# Patient Record
Sex: Male | Born: 1945
Health system: Southern US, Community
[De-identification: ages and names within clinical notes are randomized; demographics above are authoritative.]

## PROBLEM LIST (undated history)

## (undated) DIAGNOSIS — I10 Essential (primary) hypertension: Secondary | ICD-10-CM

## (undated) DIAGNOSIS — R569 Unspecified convulsions: Secondary | ICD-10-CM

## (undated) DIAGNOSIS — K219 Gastro-esophageal reflux disease without esophagitis: Secondary | ICD-10-CM

## (undated) DIAGNOSIS — E119 Type 2 diabetes mellitus without complications: Secondary | ICD-10-CM

## (undated) DIAGNOSIS — N400 Enlarged prostate without lower urinary tract symptoms: Secondary | ICD-10-CM

## (undated) DIAGNOSIS — U071 COVID-19: Secondary | ICD-10-CM

## (undated) DIAGNOSIS — E7800 Pure hypercholesterolemia, unspecified: Secondary | ICD-10-CM

## (undated) DIAGNOSIS — J449 Chronic obstructive pulmonary disease, unspecified: Secondary | ICD-10-CM

## (undated) HISTORY — DX: Type 2 diabetes mellitus without complications: E11.9

## (undated) HISTORY — DX: Gastro-esophageal reflux disease without esophagitis: K21.9

## (undated) HISTORY — DX: Benign prostatic hyperplasia without lower urinary tract symptoms: N40.0

---
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
## 1996-12-17 HISTORY — PX: KNEE ARTHROSCOPY: SHX127

## 2002-03-22 ENCOUNTER — Encounter: Payer: Self-pay | Admitting: Emergency Medicine

## 2002-03-22 ENCOUNTER — Emergency Department (HOSPITAL_COMMUNITY): Admission: EM | Admit: 2002-03-22 | Discharge: 2002-03-22 | Payer: Self-pay | Admitting: Emergency Medicine

## 2002-03-31 ENCOUNTER — Encounter: Payer: Self-pay | Admitting: Internal Medicine

## 2002-03-31 ENCOUNTER — Ambulatory Visit (HOSPITAL_COMMUNITY): Admission: RE | Admit: 2002-03-31 | Discharge: 2002-03-31 | Payer: Self-pay | Admitting: Internal Medicine

## 2002-05-08 ENCOUNTER — Ambulatory Visit (HOSPITAL_COMMUNITY): Admission: RE | Admit: 2002-05-08 | Discharge: 2002-05-08 | Payer: Self-pay | Admitting: *Deleted

## 2002-05-08 ENCOUNTER — Encounter: Payer: Self-pay | Admitting: *Deleted

## 2003-06-03 ENCOUNTER — Encounter: Payer: Self-pay | Admitting: Internal Medicine

## 2003-06-03 ENCOUNTER — Ambulatory Visit (HOSPITAL_COMMUNITY): Admission: RE | Admit: 2003-06-03 | Discharge: 2003-06-03 | Payer: Self-pay | Admitting: Internal Medicine

## 2003-10-31 ENCOUNTER — Inpatient Hospital Stay (HOSPITAL_COMMUNITY): Admission: EM | Admit: 2003-10-31 | Discharge: 2003-11-08 | Payer: Self-pay | Admitting: Emergency Medicine

## 2003-11-02 ENCOUNTER — Encounter (INDEPENDENT_AMBULATORY_CARE_PROVIDER_SITE_OTHER): Payer: Self-pay | Admitting: *Deleted

## 2006-07-08 ENCOUNTER — Ambulatory Visit (HOSPITAL_COMMUNITY): Admission: RE | Admit: 2006-07-08 | Discharge: 2006-07-08 | Payer: Self-pay | Admitting: Internal Medicine

## 2010-07-05 ENCOUNTER — Emergency Department (HOSPITAL_COMMUNITY): Admission: EM | Admit: 2010-07-05 | Discharge: 2010-07-06 | Payer: Self-pay | Admitting: Emergency Medicine

## 2011-03-03 LAB — URINALYSIS, ROUTINE W REFLEX MICROSCOPIC
Bilirubin Urine: NEGATIVE
Glucose, UA: 250 mg/dL — AB
Hgb urine dipstick: NEGATIVE
Ketones, ur: NEGATIVE mg/dL
Nitrite: NEGATIVE
Protein, ur: NEGATIVE mg/dL
Specific Gravity, Urine: 1.017 (ref 1.005–1.030)
Urobilinogen, UA: 0.2 mg/dL (ref 0.0–1.0)
pH: 5.5 (ref 5.0–8.0)

## 2011-03-03 LAB — DIFFERENTIAL
Basophils Absolute: 0.2 10*3/uL — ABNORMAL HIGH (ref 0.0–0.1)
Basophils Relative: 1 % (ref 0–1)
Eosinophils Absolute: 0.5 10*3/uL (ref 0.0–0.7)
Eosinophils Relative: 3 % (ref 0–5)
Lymphocytes Relative: 43 % (ref 12–46)
Lymphs Abs: 6.5 10*3/uL — ABNORMAL HIGH (ref 0.7–4.0)
Monocytes Absolute: 1.4 10*3/uL — ABNORMAL HIGH (ref 0.1–1.0)
Monocytes Relative: 9 % (ref 3–12)
Neutro Abs: 6.4 10*3/uL (ref 1.7–7.7)
Neutrophils Relative %: 44 % (ref 43–77)

## 2011-03-03 LAB — COMPREHENSIVE METABOLIC PANEL
ALT: <8 U/L (ref 0–53)
AST: 42 U/L — ABNORMAL HIGH (ref 0–37)
Albumin: 4.5 g/dL (ref 3.5–5.2)
Alkaline Phosphatase: 53 U/L (ref 39–117)
BUN: 12 mg/dL (ref 6–23)
CO2: 10 mEq/L — ABNORMAL LOW (ref 19–32)
Calcium: 9.7 mg/dL (ref 8.4–10.5)
Chloride: 106 mEq/L (ref 96–112)
Creatinine, Ser: 1.39 mg/dL (ref 0.4–1.5)
GFR calc Af Amer: >60 mL/min (ref >60)
GFR calc non Af Amer: 52 mL/min — ABNORMAL LOW (ref >60)
Glucose, Bld: 219 mg/dL — ABNORMAL HIGH (ref 70–99)
Potassium: 4.5 mEq/L (ref 3.5–5.1)
Sodium: 141 mEq/L (ref 135–145)
Total Bilirubin: 0.4 mg/dL (ref 0.3–1.2)
Total Protein: 7.7 g/dL (ref 6.0–8.3)

## 2011-03-03 LAB — PROTIME-INR
INR: 1.22 (ref 0.00–1.49)
Prothrombin Time: 15.3 seconds — ABNORMAL HIGH (ref 11.6–15.2)

## 2011-03-03 LAB — CBC
HCT: 52.1 % — ABNORMAL HIGH (ref 39.0–52.0)
Hemoglobin: 17.5 g/dL — ABNORMAL HIGH (ref 13.0–17.0)
MCH: 32.1 pg (ref 26.0–34.0)
MCHC: 33.6 g/dL (ref 30.0–36.0)
MCV: 95.6 fL (ref 78.0–100.0)
Platelets: 263 10*3/uL (ref 150–400)
RBC: 5.45 MIL/uL (ref 4.22–5.81)
RDW: 13.2 % (ref 11.5–15.5)
WBC: 15 10*3/uL — ABNORMAL HIGH (ref 4.0–10.5)

## 2011-03-03 LAB — POCT CARDIAC MARKERS
CKMB, poc: <1 ng/mL — ABNORMAL LOW (ref 1.0–8.0)
Myoglobin, poc: 133 ng/mL (ref 12–200)
Troponin i, poc: <0.05 ng/mL (ref 0.00–0.09)

## 2011-03-03 LAB — BLOOD GAS, ARTERIAL
Bicarbonate: 19.1 mEq/L — ABNORMAL LOW (ref 20.0–24.0)
FIO2: 0.21 %
O2 Saturation: 91.8 %
Patient temperature: 98.6
pH, Arterial: 7.346 — ABNORMAL LOW (ref 7.350–7.450)

## 2011-03-03 LAB — APTT: aPTT: 28 seconds (ref 24–37)

## 2011-03-03 LAB — ETHANOL: Alcohol, Ethyl (B): <5 mg/dL (ref 0–10)

## 2011-03-03 LAB — VALPROIC ACID LEVEL: Valproic Acid Lvl: 25.8 ug/mL — ABNORMAL LOW (ref 50.0–100.0)

## 2011-03-03 LAB — PATHOLOGIST SMEAR REVIEW

## 2011-05-04 NOTE — H&P (Signed)
Trevor [name], Trevor [name]                           ACCOUNT NO.:  [id number]   MEDICAL RECORD NO.:  [id number]                   PATIENT TYPE:  INP   LOCATION:  1826                                 FACILITY:  [facility]   PHYSICIAN:  Elliot Cousin, M.D.                 DATE OF BIRTH:  Feb 18, 1946   DATE OF ADMISSION:  10/30/2003  DATE OF DISCHARGE:                                HISTORY & PHYSICAL   PRIMARY CARE PHYSICIAN:  Lucky Cowboy, M.D.   CHIEF COMPLAINT:  Tonic clonic seizure and postictal syncope, postictal  nausea and vomiting, cough.   HISTORY OF PRESENT ILLNESS:  Trevor [name] is a 65 year old man with a past  medical history significant for hypertension, Chronic pulmonary obstructive  disease and depression who presented to the emergency department on the  night of October 30, 2003 with an episode of a witnessed tonic clonic  seizure by his wife.  This occurred at approximately 8 p.m. last night.  The  patient was getting ready for bed.  His wife noticed that he had a funny  look on his face.  He seemed spaced out.  He began to stare out in space  for a few seconds, unresponsive to his name.  He suddenly began shaking all  over and fell back onto the bed.  The patient was apparently unconscious for  approximately three to five minutes per the wife's account.  The patient  finally regained consciousness.  He was alert and oriented to his  surroundings when he came to.  He did not have any incontinence of his  bladder or bowels.  There was no trauma.  The patient fell back onto his  bed.  The patient's wife proceeded to call EMS.  When EMS arrived the  patient was alert and oriented to all questions.  His skin was pale,  however, he was not diaphoretic or short of breath.  His blood pressure was  143/72, his pulse was 56, and his respiratory rate was 18.  His capillary  blood sugar was 301.  The patient was transferred to the emergency  department.  When the patient arrived to the  emergency department he was  alert and oriented.  He had no complaints.  CT scan of the head was ordered  and was negative.  His blood work was rather unremarkable with exception of  a blood glucose of 205.  The patient has no known previous history of  diabetes.  The patient was in the process of going home, discharged by the  emergency department physician.  The patient was on his way to the bathroom  when suddenly he began rolling his eyes back in his head and began having a  tonic clonic jerking of his extremities.  He was about to fall on the floor  but was caught by the nurse and his wife.  The nurse witnessed the tonic  clonic seizure which lasted approximately 20 to 30 seconds.  The patient was  postictal for about five minutes then regained consciousness.  The patient  will therefore be admitted for evaluation and management of apparently new  onset seizures and newly diagnosed diabetes mellitus.   PAST MEDICAL HISTORY:  1. History of syncope back in April of 2003.     a. Outpatient evaluation revealed a negative VQ scan in May of 2003, a        negative stress-test per [facility], and per patient's        history, a CT scan of the head which was negative in April of 2003.  2. Depression with a recent change in psychotropic medications.     a. Zoloft discontinued on October 26, 2003 and Seroquel and Effexor added        on October 26, 2003.  3. Anxiety.  4. Recent insomnia.  5. Fluid on the ears per doctor's visit on October 26, 2003.  The patient     also had an episode of tinnitus on October 26, 2003.  6. Hypertension.  7. Hyperlipidemia.  8. Chronic pulmonary obstructive disease with ongoing tobacco abuse.  9. A colon polyp status post polypectomy per colonoscopy in 2003 per Dr.     Arlyce Dice (old records not available).    MEDICATIONS:  1. Seroquel, question dose, one daily (started on October 26, 2003).  2. Remeron 30 mg 1/2 tablet q.h.s.  3. Effexor, question  dose, daily (started October 26, 2003).  4. Atenolol 25 mg daily.  5. Lipitor 20 mg q.h.s.  6. Zoloft 25 mg daily (stopped on October 26, 2003).   ALLERGIES:  PENICILLIN.   SOCIAL HISTORY:  The patient is married and lives in [place] with his  wife.  He has two children.  He works at [employer].  He denies alcohol  use.  He denies drug use.  He smokes approximately a pack of cigarettes per  week and has been doing so for 40 years.  He has had a recent change in his  job function which has been of some distress to him.   FAMILY HISTORY:  His father died of renal failure at 27 years of age, he  also had a history of diabetes mellitus.  His mother died of a stroke at 11  years of age.   REVIEW OF SYSTEMS:  The patient's review of systems is negative for fever,  chills, headache, shortness of breath, dysuria, joint pain and rash.  The  patient has a negative history of rhinorrhea, negative history of sore  throat.   His review of systems is positive for chronic blurred vision (no double  vision). He has also had some intermittent dizziness over the past couple of  days.  His review of systems is also positive for insomnia and a ringing and  popping in both ears.  His review of systems is also positive for an  increase in depression and anxiety but no suicidal ideations.  He  occasionally has shortness of breath but no orthopnea.  He has had a  productive cough with yellow and brown sputum over the past two or three  days.  He occasionally has chest pain (once or twice) particularly over the  past two or three days but generally has chest pain rarely.  He has also had  a recent increase in insomnia.  He has had mild epigastric discomfort which  he does not describe as pain.  He has loose stools about five days ago.  The patient has had recent complaints of polyuria.  PHYSICAL EXAMINATION:  VITAL SIGNS:  Temperature 98.9, pulse 88, blood  pressure 124/68, respiratory rate 20,  oxygen saturation 99% on two liters.  GENERAL:  The patient is now an alert 65 year old, Caucasian man of average  build who is currently lying in bed alert and in no acute distress.  HEENT:  Head is normocephalic, atraumatic.  Pupils are equal, round and  reactive to light.  Extraocular movements are intact.  Conjunctivae are  clear, sclerae are white.  He does appear to have a mild strabismus.  Tympanic membranes are mildly obscured by hair and cerumen.  No fluid is  seen bilaterally.  Nasal mucosa is moist, no drainage, no sinus tenderness.  Oropharynx reveals mildly dry mucous membranes.  He does have whitish  discoloration of his tongue.  There is no posterior exudates, no erythema.  NECK:  Supple, no adenopathy, no thyromegaly, no bruit.  LUNGS:  Clear to auscultation bilaterally.  His breathing is unlabored.  He  did have several episodes of coughing which was productive of yellow sputum  as well as brownish sputum.  HEART:  S1, S2 with no murmurs, rubs, or gallops.  ABDOMEN:  Hypoactive bowel sounds.  Abdomen is soft, nontender,  nondistended, no hepatosplenomegaly, no masses palpated.  RECTAL/GU:  Deferred.  EXTREMITIES:  The patient has 1+ pedal pulses bilaterally.  No pretibial  edema. He moves his arms and legs with good range of motion.  No acute joint  abnormalities.  NEUROLOGIC:  The patient is alert and oriented x3, however, he did miss the  date but did get the day of the week, the year, the month, the city, the  state, his birth date, the president, his wife's full name and birth date.  Cranial nerves II-XII are intact with exception of a mild strabismus.  Cerebellar was intact with finger-to-nose and heel-to-shin bilaterally.  This testing was done in the sitting position.  Strength is 5/5 bilaterally  and symmetrically with the upper and the lower extremities.  Sensation is  intact.  Gait was not assessed.  Plantar reflexes were down going on the  right, questionable  up going on the left.  The patient had no facial droop,  no dysarthria.  His speech was appropriate and he followed directions well.   ADMISSION LABORATORIES:  CT scan of the head was negative for any acute  abnormalities.  WBC 10.1 thousand, hemoglobin 16.6, hematocrit 48.2, MCV  91.1, platelets 255,000.  Sodium 135, potassium 4.7, chloride 102, CO2 24,  glucose 205, BUN 18, creatinine 1.3, calcium 9.6, total protein 7.6, albumin  4.4, AST 28, ALT 25, alkaline phosphatase 77, total bilirubin 1.2.   ASSESSMENT:  1. Tonic clonic seizure, new onset.  The patient had two witnessed tonic     clonic seizures, one at home and one in the emergency department as     witnessed by the R. N.  The patient's syncope work up was negative in the     emergency department and the patient was actually getting prepared to go     home.  He subsequently had the second seizure.  The patient had no head    trauma.  He was postictal for about five minutes but on current exam he     was alert and oriented.  The differential diagnosis includes seizure from     new medications including Effexor which has a positive  association with     seizures, Seroquel which has a rare association with seizures.  Will also     consider and abrupt withdrawal of the Zoloft as a potential cause.  Will     also consider electrolyte abnormalities and endocrine abnormalities  such     as low magnesium, low phosphorus, elevated blood glucose, and thyroid     disease, respectively.  In addition will need to rule out cerebrovascular     disease as well as cerebral tumor.  It is unlikely that the patient has     meningitis or encephalitis given that he is afebrile and does not have an     elevated white blood cell count.  2. Postictal syncope.  The patient has now completely recovered from the     seizure, he is totally alert and conscious now.  The patient had an     episode of syncope back in April of 2003 which yielded a negative work  up     per patient's history.  3. Depression with anxiety.  The patient has had increase in anxiety and     depression symptoms after his job duties were changed recently.  This     apparently prompted Dr. Oneta Rack to change his antidepressant medications.  4. Postictal nausea and vomiting.  The patient apparently had no nausea and     vomiting at home.  Following the seizure in the emergency department he     had several episodes of nausea and vomiting.  The patient did say he had     some abdominal discomfort at home but he did not described it as pain.     He denies abdominal pain at this time.  5. Productive cough.  The patient has had a productive cough over the past     few days.  He has a history of chronic pulmonary obstructive disease by     radiographic changes.  He also smokes a pack of cigarettes per week but     he has smoked quite a bit more in the past.  6. Hyperglycemia.  The patient's blood sugar was 301 on evaluation by EMS,     and was 205 in the emergency department.  The patient probably has newly     diagnosed type 2 diabetes mellitus.   PLAN:  1. The patient was given a loading dose of Dilantin one gram IV x1.  Will     request a neurology consult and will defer further anti-seizure     medication treatment to the neurologist for now.  2. Will add p.r.n. Ativan 1-2 mg p.r.n. for seizure.  3. The patient will be admitted to a telemetry bed and placed on seizure     precautions.  Neuro checks will be obtained every three hours x12 hours     and then every four hours times an additional 24 hours.  4. Will check a urine drug screen, TSH, free T4, RPR, sed rate, vitamin B12,     and folate levels.  5. Will check a magnesium and phosphorus level.  Will also check an amylase     and lipase level.  Will check a chest x-ray and an acute abdominal     series.  6. Will also check an ultrasound of the abdomen to evaluate the nausea and     vomiting. 7. Will order an MRI of  the brain to evaluate the seizure.  Will also order     EEG  which will probably be read by the neurology team.  8. The patient was given one liter of normal saline in the emergency     department.  Will continue volume repletion with normal saline at 100     cc/Hr.  9. Will add a sliding scale Insulin regimen q.a.c. and q.h.s.  10.      Will add Protonix 40 mg IV daily.  11.      Will add Phenergan and Zofran p.r.n. for nausea.  Will hold on     Reglan for now because it does lower the seizure threshold.  12.      Atrovent nebulization q.6h.  Will add Robitussin p.r.n. for cough.  13.      Keep patient on sips and chips for now and rare p.o. medications.     If the patient's nausea and vomiting subsides and if his ultrasound is     negative will re-start his diet but will place the patient on a     carbohydrate modified diet.  14.      Will hold his psychotropic medications for now as well as his     Atenolol and Lipitor.  Will start when the patient is free from nausea     and vomiting.                                                Elliot Cousin, M.D.    DF/MEDQ  D:  10/31/2003  T:  10/31/2003  Job:  [id number]   cc:   Lucky Cowboy, M.D.  [address]  [place], [place] [address]  Fax: [phone]

## 2011-05-04 NOTE — Discharge Summary (Signed)
NAMEJARVIS, Trevor [name]                           ACCOUNT NO.:  [id number]   MEDICAL RECORD NO.:  [id number]                   PATIENT TYPE:  INP   LOCATION:  3015                                 FACILITY:  [facility]   PHYSICIAN:  Elliot Cousin, M.D.                 DATE OF BIRTH:  07/10/1946   DATE OF ADMISSION:  10/30/2003  DATE OF DISCHARGE:  11/08/2003                                 DISCHARGE SUMMARY   DISCHARGE DIAGNOSES:  1. Complex partial seizure with generalization.  2. Acute renal insufficiency, nonoliguric.  3. Rash thought to be secondary to Dilantin.  4. Hyperglycemia.  5. Posttraumatic stress disorder.   SECONDARY DISCHARGE DIAGNOSES:  1. History of syncope in April, 2003.     a. Outpatient evaluation revealed a negative VQ scan in May of 2003 and a        negative stress test per [facility] and per patient's history        a negative CT scan of the head in April of 2003.  2. Depression with a recent change in psychotropic medications.     a. Zoloft discontinued on October 26, 2003 and Seroquel and Effexor added        on October 26, 2003.  3. Hypertension.  4. Hyperlipidemia.  5. Chronic obstructive pulmonary disease with ongoing tobacco abuse.  6. Colon polyp status post polypectomy per colonoscopy in 2003 per Dr.     Arlyce Dice (old records not available).   DISCHARGE MEDICATIONS:  1. Depakote 500 mg one p.o. t.i.d.  2. Lipitor 10 mg q.h.s.  3. Atenolol 25 mg daily.  4. Discontinue Seroquel and Remeron for now.  5. Also discontinue Effexor and followup with [facility] for new     psychotropic medications.   DISCHARGE DISPOSITION:  The patient was discharged to home on November 08, 2003, in improved and stable condition.  He was advised to followup with his  primary care physician, Dr. Oneta Rack, in one week.  He was also advised to  followup at [facility] in two days at the walk-in clinic.   CONSULTATIONS:  1. Delia Heady, M.D.  2. Dr.  Darrick Penna.  3. Dr. Jeanie Sewer.   PROCEDURES PERFORMED:  1. CT scan of the head on December 30, 2002 negative.  2. MRI of the brain on October 31, 2003.  No lesion is seen that would     precipitate seizures.  Solitary punctate focus of abnormal signal in the     right frontal white matter which was isolated.  3. EEG on November 01, 2003.  The results revealed abnormal EEG on the basis     of diffuse background slowing.  No definite seizure activity in this     record as read by Dr. Sharene Skeans.  4. MRI of the abdomen.  The results revealed single renal arteries     bilaterally.  Mild narrowing at the  origin of the right renal artery,     doubt this is hemodynamically significant.  5. Ultrasound of the abdomen on October 31, 2003.  The results were     negative.   HISTORY OF PRESENT ILLNESS:  [name] is a 65 year old man with a past  medical history significant for hypertension, COPD, and depression who  presented to the emergency department on the night of October 30, 2003,  with an episode of a witnessed tonic clonic seizure.  The patient was not  incontinent of his bladder or bowels per his wife.  There was no trauma.  The patient fell back onto the bed.  His wife called EMS.  When EMS arrived,  he was alert and oriented to all questions.  When he presented to the  emergency department, his evaluation was negative.  However, as the patient  was being discharged from the emergency department, he experienced another  tonic clonic seizure.  The patient was therefore admitted for evaluation and  management of seizure disorder.   HOSPITAL COURSE:  COMPLEX PARTIAL SEIZURE WITH GENERALIZATION.  The initial  management started in the emergency department when the patient was loaded  with 500 mg of Dilantin IV.  He was transferred to a telemetry bed and  monitored closely.  Seizure precautions and neurological checks were  instituted during the hospitalization.  Laboratory studies were  ordered to  rule out an endocrine or metabolic cause of the patient's apparent new onset  seizures.  An urine drug screen was also ordered.  An MRI of the head and an  EEG were also ordered for evaluation.  The patient's initial blood work was  significant for a glucose of 205, a magnesium of 3.5, phosphorus of 2.1, and  the remainder of the chemistry panel was within normal limits.  The  patient's CBC was within normal limits as well.  Further lab results  included a normal TSH at 1.699, a normal free T4 at 1.26, a normal B12 level  at 511, and a normal folate level of 11.9.  RPR was also negative.  The  urine drug screen was positive only for benzodiazepines which was  attributable to his psychotropic medications.  The MRI of the brain showed  no acute strokes or intracranial masses.  The MRI did show a solitary  punctate focus of abnormal signal in the right frontal white matter.  This  finding was nonspecific.  The EEG revealed abnormal diffuse background  slowing.  There was no definite seizure activity on the record.   Dr. Pearlean Brownie provided the neurological consultation.  Per his assessment, the  patient most likely experienced complex partial seizures with secondary  generalization.  The precipitating factors per his history included a lack  of sleep for the past two days.  Dr. Pearlean Brownie also felt that the patient most  likely had epilepsy, given that he had an unwitnessed synopal episode one  year ago and possible past episodes of staring spells.  He recommended long-  term anticonvulsant therapy.  He agreed with the Dilantin and recommended  Dilantin dosing during the hospitalization.  There was also a concern about  Seroquel and Effexor to medications that were started on October 26, 2003.  Could these medications lower the seizure threshold.  Dr. Jeanie Sewer was  consulted for his evaluation and recommendations.  Dr. Jeanie Sewer thought that the Seroquel and Effexor could lower the seizure  threshold because of  the increased release of norepinephrine.  Dr. Jeanie Sewer recommended changing  the antidepressant medications during the hospital course, however the  patient was reluctant to.  Dr. Jeanie Sewer recommended Celexa.  However, the  patient will make the decision following outpatient evaluation.  The patient  had no further seizures during the hospitalization.  Both the Remeron and  the Effexor as well as the Seroquel were held during the hospital course.  The Dilantin eventually had to be discontinued because the patient developed  a diffuse rash.  He was, therefore, started on treatment with Depakote 500  mg b.i.d. which was titrated up to 500 mg t.i.d. prior to hospital  discharge.  He had no further seizures during the hospitalization.  He was  advised to followup with Dr. Pearlean Brownie, if needed, in 2-3 weeks.   1. ACUTE NONOLIGURIC RENAL INSUFFICIENCY.  The patient's initial BUN and     creatinine were 18 and 1.3 respectively.  However, on hospital day #3 the     creatinine increased to 5.8 and the BUN increased to 57.  The creatinine     increased further to 6.1 and the BUN increased further to 58.  The     patient had good urine output during the entire hospital course.  The     etiology of the acute renal insufficiency was unclear, therefore, a Renal     consult was requested.  Dr. Darrick Penna provided the initial Nephrology     consultation.  He recommended obtaining an MRA/MRI of the abdomen,     specifically looking at the renal arteries, as well as an ultrasound of     the abdomen, specifically looking at the kidneys.  He also ordered     several other tests including urine eosinophils, a compliment panel and     an ANA.  The MRA of the abdomen revealed no significant stenoses,     although there was some mild narrowing at the origin of the right renal     artery.  Dr. Darrick Penna felt that this was not a cause of the patient's     renal insufficiency.  The ultrasound of  the abdomen was within normal     limits.  The urine for eosinophils were negative.  The ANA was negative     as well.  The neutrophil cytoplasm antibody IgG was negative.  The serum     IgG was mildly low at 591 and the serum IgM was within normal limits at     134.  The creatine kinase was within normal limits at 146.  The patient's     renal function slowly improved with gentle volume repletion and a volume     challenge.  Over the last two days of hospitalization, the BUN slowly     decreased to a nadir of 18 prior to hospital discharge.  The patient's     creatinine also decreased to a nadir of 1.9 prior to hospital discharge.     The etiology of the acute renal insufficiency is unknown, however     Dilantin may have caused the insult.  As stated above, the Dilantin was     discontinued.  The patient's azotemia improved thereafter.   1. DIFFUSE MACULOPAPULAR RASH.  Following the loading dose of Dilantin, the    patient began to experience a pruritic rash approximately 48 hours later.     He was treated with as-needed Benadryl.  Dr. Pearlean Brownie was asked to evaluate     the patient's seizure medication for a possible change.  Dr. Pearlean Brownie  thought that the rash was probably precipitated by the Dilantin,     therefore he discontinued the Dilantin and started the patient on     treatment with Depakote 500 mg b.i.d. titrate to t.i.d.  Shortly     thereafter the patient's rash started resolving.   1. HYPERLIPIDEMIA.  The patient's blood glucose on admission was 205.  The     patient had no prior history of diabetes mellitus.  His capillary blood     sugars were followed during the hospital course and ranged between 90 and     140.  A hemoglobin A1c was ordered for evaluation.  The A1c was 6.3.  The     patient was placed on a carbohydrate modified diet and treated with a     sliding scale insulin regimen during the hospital course.  His blood     sugars returned to normal prior to hospital  discharge.  He apparently     needs no further management at this time, however he may be at risk for     type 2 diabetes mellitus.   1. POSTTRAUMATIC STRESS DISORDER WITH A HISTORY OF DEPRESSION.  The patient     had been treated in the past with Zoloft by his primary care physician,     Dr. Oneta Rack.  He was also treated with Remeron 30 mg 1/2 tablet q.h.s.     However, the Zoloft was discontinued on October 25, 2004, and Seroquel     and Effexor was started on October 26, 2003.  These two medications, the     Seroquel and the Effexor, lower the seizure threshold, therefore they     were discontinued during the hospital course.  Dr. Jeanie Sewer,     psychiatrist, was consulted.  He recommended started Celexa, however the     patient was hesitant.  Dr. Jeanie Sewer strongly recommended that the     patient followup in the [facility] shortly following     discharge.  Arrangements were made by Dr. Jeanie Sewer to have the patient     evaluated at [facility] two to three days     following hospital discharge.  The patient did have some moments of     confusion during the hospitalization, however the confusion resolved     prior to hospital discharge.  The intermittent confusion may have been     secondary to the acute seizure and/or medication changes.                                                Elliot Cousin, M.D.    DF/MEDQ  D:  01/15/2004  T:  01/16/2004  Job:  [id number]   cc:   Lucky Cowboy, M.D.  [address]  [place], [place] [address]  Fax: [phone]   Antonietta Breach, M.D.  [address]  [place], [place] [address]  Fax: [phone]   Pramod P. Pearlean Brownie, MD  Fax: [phone]

## 2011-05-04 NOTE — Consult Note (Signed)
Trevor [name], Trevor [name]                           ACCOUNT NO.:  [id number]   MEDICAL RECORD NO.:  [id number]                   PATIENT TYPE:  INP   LOCATION:  2023                                 FACILITY:  [facility]   PHYSICIAN:  Pramod P. Pearlean Brownie, MD                 DATE OF BIRTH:  1946/02/01   DATE OF CONSULTATION:  10/31/2003  DATE OF DISCHARGE:                                   CONSULTATION   REFERRING PHYSICIAN:  Elliot Cousin, M.D.   REASON FOR REFERRAL:  Seizure.   HISTORY OF PRESENT ILLNESS:  [name] is a 65 year old who had two  witnessed episodes of generalized tonic-clonic seizures yesterday evening  and this morning.  The patient was unable to describe episodes and they were  described by his wife who was eyewitness to both.  Yesterday evening the  first episode occurred at about 8:15, when they had just returned from  church and were getting ready for bed.  Wife stated that he started acting  funny and had a strange grin in his face and kind of a blank staring  appearance.  This was followed by his eyes rolling up and the patient  falling back into a chair and having generalized tonic-clonic seizure.  He  remained unresponsive for a few minutes and then regained consciousness and  was confused and disoriented for 10 to 15 minutes.  EMS brought him to the  emergency room and he quickly was back to his baseline.  A non-contrast CAT  scan of the head was obtained which was unremarkable.  The patient was being  discharged at about 2 a.m., while walking he then had a strange appearance  of his face and was unresponsive and starting falling and having a  generalized tonic-clonic seizure.  He was caught by the nursing personnel  and did not have any injury.  He was similarly postictal for a few minutes,  and then gradually regained consciousness.  He has a previous history of an  unwitnessed syncopal event in April 2003.  Wife states that she heard him go  to the shower and then  she heard a loud noise and found him on the floor,  this time, however, he was not completely unconscious and was able to recall  what happened and stated that he felt dizzy before he lost consciousness.  He did have a workup of this syncope at that time, including a stress test,  as well as a V/Q scan which were negative.  He did not have any __________  on MRI.  He has no prior history of documented seizures.  However, upon  inquiry, the wife states that in the last six months she has noted several  episodes when he has been staring unresponsive and wringing his hands while  sitting in the chair, and when she calls out to him he is able to respond,  but states only monosyllables like I am okay.  These episodes are very brief  and not more than a few minutes.  He has no prior history of childhood  seizures, febrile seizures, significant head injury with loss of  consciousness, headaches, or strokes.   PAST MEDICAL HISTORY:  1. Depression with anxiety.  He has been on a variety of psychotropic drugs,     and Zoloft which was recently discontinued four days ago and changed to     Seroquel and Effexor.  2. History of newly diagnosed diabetes.  3. Hypertension.  4. Hyperlipidemia.  5. Strabismus.  6. COPD.  7. Colon polyp.   HOME MEDICATIONS:  1. Seroquel.  2. Remeron.  3. Effexor.  4. Atenolol.  5. Lipitor.  6. Zoloft.   SOCIAL HISTORY:  The patient lives in [place] with his wife.  He smokes  less than a half pack per day and does not drink alcohol or do drugs.  He  works for [employer].   REVIEW OF SYSTEMS:  Significant for insomnia for the last two to three days.  He has not slept at all.  No history of chronic sleep problems.  No recent  chest pain, shortness of breath, diarrhea, fever, or loss of weight.   PHYSICAL EXAMINATION:  GENERAL:  A pleasant, middle-aged gentleman not in  distress.  VITAL SIGNS:  Afebrile, pulse rate 72 per minute and regular,  respiratory  rate of 16 per minute.  Distal pulses are well felt.  HEENT:  Nontraumatic.  ENT exam unremarkable.  NECK:  Supple without bruit.  CARDIAC:  No murmurs, rubs, or gallops.  LUNGS:  Clear to auscultation.  ABDOMEN:  Soft and nontender.  NEUROLOGIC:  The patient is awake, alert, and oriented x3 with normal speech  and language function.  Does not appear to have apraxia or dysarthria.  Pupils are equal and reactive.  EOM are full range without nystagmus.  He  has some saccadic dysmetria to horizontal gaze bilaterally.  Pupils are  equal and reactive.  Visual acuity and fields are adequate.  Face is  symmetric bilaterally.  Palate moves normally; tongue is midline.  Motor and  sensory exam reveals symmetric upper and lower extremity strength, tone,  reflexes, coordination, and sensation.  He has minimal postural tremor which  is very fine and does not significantly worsen with intention.  He is able  to walk with a steady gait and stand on either foot unsupported.   LABORATORY DATA:  Non-contrast CAT scan of the head was done today.  It  reveals no obstructive lesion, tumor, infarction, or any acute  abnormalities.  EKG reveals sinus rhythm with heart rate of 68 per minute  without acute ischemic findings.  CBC is normal.  Blood chemistries normal.  Urine drug screen is pending.   IMPRESSION:  A 65 year old gentleman with two witnessed episodes of likely  complex partial seizures with secondary degeneralization.  The obvious  precipitating factors in the present case is lack of sleep for the last two  days.  Medication effect from Effexor and Seroquel is less likely.  Given  the fact that he has had another unwitnessed syncopal event a year ago and  possible episodes of staring spells, I think he likely has epilepsy and  needs long-term anticonvulsants.  PLAN:  I agree with phenytoin for seizure prevention.  Aim for a level of 15-  20 mg percent.  Continue to maintain  phenantoin 300 mg a day.  Check a MRI  scan of the brain with contrast as well as an EEG and urine drug screen.  I  had a long discussion with the patient and his family regarding his symptoms  and plan for treatment, and answered questions.  I have advised him not to  drive for the next six months as mandated by [facility] and to  regularize his sleep-wake cycle.  Consider using benzodiazepines if his  insomnia continues.  I would also recommend he see a psychiatrist for  optimization of his anxiety and depression medications.  Thank you for the  referral.  Kindly call for questions.  I will follow him.                                               Pramod P. Pearlean Brownie, MD    PPS/MEDQ  D:  10/31/2003  T:  10/31/2003  Job:  [id number]   cc:   Lucky Cowboy, M.D.  [address], [address]  [place], [place] [address]  Fax: [phone]

## 2012-12-17 ENCOUNTER — Encounter (HOSPITAL_COMMUNITY): Payer: Self-pay | Admitting: Adult Health

## 2012-12-17 ENCOUNTER — Emergency Department (HOSPITAL_COMMUNITY): Payer: Medicare Other

## 2012-12-17 ENCOUNTER — Other Ambulatory Visit: Payer: Self-pay

## 2012-12-17 ENCOUNTER — Emergency Department (HOSPITAL_COMMUNITY)
Admission: EM | Admit: 2012-12-17 | Discharge: 2012-12-17 | Disposition: A | Discharge status: Home / Self Care | Payer: Medicare Other | Attending: Emergency Medicine | Admitting: Emergency Medicine

## 2012-12-17 DIAGNOSIS — J069 Acute upper respiratory infection, unspecified: Secondary | ICD-10-CM | POA: Insufficient documentation

## 2012-12-17 DIAGNOSIS — J984 Other disorders of lung: Secondary | ICD-10-CM | POA: Diagnosis not present

## 2012-12-17 DIAGNOSIS — R062 Wheezing: Secondary | ICD-10-CM | POA: Diagnosis not present

## 2012-12-17 DIAGNOSIS — R0989 Other specified symptoms and signs involving the circulatory and respiratory systems: Secondary | ICD-10-CM | POA: Insufficient documentation

## 2012-12-17 DIAGNOSIS — R911 Solitary pulmonary nodule: Secondary | ICD-10-CM | POA: Diagnosis not present

## 2012-12-17 DIAGNOSIS — F17200 Nicotine dependence, unspecified, uncomplicated: Secondary | ICD-10-CM | POA: Diagnosis not present

## 2012-12-17 DIAGNOSIS — R05 Cough: Secondary | ICD-10-CM | POA: Diagnosis not present

## 2012-12-17 DIAGNOSIS — E119 Type 2 diabetes mellitus without complications: Secondary | ICD-10-CM | POA: Insufficient documentation

## 2012-12-17 DIAGNOSIS — Z8669 Personal history of other diseases of the nervous system and sense organs: Secondary | ICD-10-CM | POA: Diagnosis not present

## 2012-12-17 DIAGNOSIS — J988 Other specified respiratory disorders: Secondary | ICD-10-CM

## 2012-12-17 HISTORY — DX: Unspecified convulsions: R56.9

## 2012-12-17 HISTORY — DX: Type 2 diabetes mellitus without complications: E11.9

## 2012-12-17 LAB — CBC
Hemoglobin: 15.7 g/dL (ref 13.0–17.0)
MCH: 31 pg (ref 26.0–34.0)
MCHC: 33.4 g/dL (ref 30.0–36.0)
RDW: 13.3 % (ref 11.5–15.5)

## 2012-12-17 LAB — BASIC METABOLIC PANEL
BUN: 12 mg/dL (ref 6–23)
Calcium: 9.9 mg/dL (ref 8.4–10.5)
GFR calc Af Amer: >90 mL/min (ref >90)
GFR calc non Af Amer: 88 mL/min — ABNORMAL LOW (ref >90)
Glucose, Bld: 126 mg/dL — ABNORMAL HIGH (ref 70–99)
Sodium: 140 mEq/L (ref 135–145)

## 2012-12-17 LAB — POCT I-STAT TROPONIN I: Troponin i, poc: 0 ng/mL (ref 0.00–0.08)

## 2012-12-17 MED ORDER — BENZONATATE 100 MG PO CAPS: 100.0000 mg | ORAL_CAPSULE | Freq: Three times a day (TID) | ORAL | Status: DC

## 2012-12-17 MED ORDER — PREDNISONE 20 MG PO TABS: ORAL_TABLET | ORAL | Status: DC

## 2012-12-17 MED ORDER — PREDNISONE 20 MG PO TABS
60.0000 mg | ORAL_TABLET | Freq: Once | ORAL | Status: AC
  Administered 2012-12-17: 60 mg via ORAL
  Filled 2012-12-17: qty 3

## 2012-12-17 MED ORDER — BENZONATATE 100 MG PO CAPS
100.0000 mg | ORAL_CAPSULE | Freq: Once | ORAL | Status: AC
  Administered 2012-12-17: 100 mg via ORAL
  Filled 2012-12-17: qty 1

## 2012-12-17 MED ORDER — ALBUTEROL SULFATE HFA 108 (90 BASE) MCG/ACT IN AERS
2.0000 | INHALATION_SPRAY | RESPIRATORY_TRACT | Status: DC | PRN
  Administered 2012-12-17: 3 via RESPIRATORY_TRACT
  Filled 2012-12-17: qty 6.7

## 2012-12-17 MED ORDER — ALBUTEROL SULFATE HFA 108 (90 BASE) MCG/ACT IN AERS: 2.0000 | INHALATION_SPRAY | RESPIRATORY_TRACT | Status: DC | PRN

## 2012-12-17 NOTE — ED Provider Notes (Signed)
I saw and evaluated the patient, reviewed the resident's note and I agree with the findings including ECG and plan.  Diffuse wheezes but speaks full sentences with normal room air pulse oximetry 90% and feels much better after breathing treatment in the ED.  Hurman Horn, MD 12/18/12 2240

## 2012-12-17 NOTE — ED Notes (Addendum)
Pt reports generlized body aches, SOB x 3 days. Denies CP, N/V/D. Reports continued NP cough.

## 2012-12-17 NOTE — ED Notes (Signed)
MD at bedside. 

## 2012-12-17 NOTE — ED Provider Notes (Signed)
History     CSN: [id number]  Arrival date & time 12/17/12  2020   None     Chief Complaint  Patient presents with  . Nasal Congestion  . Cough     HPI complaint: Cough. Location: Chest. Symptoms not improved or worsened by anything. Severity: Mild. Timing: Intermittent. Duration: Several days. No associated fevers, hemoptysis, chest pain,, vomiting, diarrhea, dizziness or syncope. Patient states mild shortness of breath and wheezing. Regarding social history see the nurse's notes. No family history of recent flulike illnesses. I have reviewed patient's past medical, past surgical, social history as well as medications and allergies.  Past Medical History  Diagnosis Date  . Seizures   . Diabetes mellitus without complication     History reviewed. No pertinent past surgical history.  History reviewed. No pertinent family history.  History  Substance Use Topics  . Smoking status: Current Every Day Smoker  . Smokeless tobacco: Not on file  . Alcohol Use: No      Review of Systems 10 Systems reviewed and are negative for acute change except as noted in the HPI.  Allergies  Penicillins  Home Medications  No current outpatient prescriptions on file.  BP 170/98  Pulse 93  Temp 99.8 F (37.7 C) (Oral)  Resp 18  SpO2 98%  Physical Exam  Constitutional: He is oriented to person, place, and time. He appears well-developed and well-nourished. No distress.  HENT:  Head: Normocephalic.  Eyes: Conjunctivae normal are normal.  Neck: Normal range of motion. Neck supple.  Cardiovascular: Normal rate, regular rhythm, normal heart sounds and intact distal pulses.   No murmur heard. Pulmonary/Chest: Effort normal. No respiratory distress. He has wheezes. He has no rales. He exhibits no tenderness.  Abdominal: Soft. Bowel sounds are normal. He exhibits no distension. There is no tenderness.  Musculoskeletal: Normal range of motion. He exhibits no edema and no tenderness.    Neurological: He is alert and oriented to person, place, and time.  Skin: Skin is warm and dry. He is not diaphoretic.  Psychiatric: He has a normal mood and affect.    ED Course  Procedures (including critical care time)  Labs Reviewed  CBC - Abnormal; Notable for the following:    WBC 10.9 (*)     All other components within normal limits  BASIC METABOLIC PANEL - Abnormal; Notable for the following:    Glucose, Bld 126 (*)     GFR calc non Af Amer 88 (*)     All other components within normal limits  GLUCOSE, CAPILLARY - Abnormal; Notable for the following:    Glucose-Capillary 106 (*)     All other components within normal limits  POCT I-STAT TROPONIN I   Dg Chest 2 View  12/17/2012  *RADIOLOGY REPORT*  Clinical Data: Shortness of breath, cough and congestion.  Shakes and occasional heart throbbing.  CHEST - 2 VIEW  Comparison: Chest radiograph performed 07/05/2010  Findings: The lungs are well-aerated.  There is a vague 2.2 cm nodular density at the right midlung zone.  There is no evidence of pleural effusion or pneumothorax.  A right-sided nipple shadow is suggested; the left-sided nipple shadow is less well seen.  The heart is normal in size; the mediastinal contour is within normal limits.  No acute osseous abnormalities are seen.  IMPRESSION: No evidence of focal airspace consolidation.  Vague 2.2 cm nodular density noted at the right midlung zone; malignancy cannot be entirely excluded.  CT of the chest would  be helpful for further evaluation, on an elective non-emergent basis.   Original Report Authenticated By: Tonia Ghent, M.D.      1. Viral upper respiratory tract infection with cough   2. Wheezing-associated respiratory infection (WARI)   3. Lung nodule seen on imaging study      EKG reviewed and interpreted: Normal sinus rhythm rate 92. Normal axis. Normal intervals. No T wave inversions. No ST segment changes. Normal QT interval. MDM  Patient is a well-appearing  67 year old male presenting with viral upper respiratory illness symptoms and a mild nonproductive cough. Patient also endorses shortness of breath and wheezing. No history of COPD but the patient is a long standing smoker. Wheezing and mild diminished breath sounds on exam which improved with albuterol. Chest x-ray clear with the exception of a new nodule which the patient was notified about and instructed to follow up with his primary care physician for further testing. Patient and family verbalized understanding. Discharge home with albuterol, prednisone burst and Tessalon Perles.        Consuello Masse, MD 12/17/12 [phone]

## 2012-12-17 NOTE — ED Notes (Addendum)
Presents with SOB, congestion, non productive cough, diarrhea, sore throat, headache and shakiness since Monday. Pt was seen on Monday at [facility] for check up and lab work, has a follow up on the 8th. Has not received results from lab work.  Bilateral lung sounds clear.  SOB and congestion is worse with lying flat. Reports left side chest throbbing and feeling like his heart rate is fast.  Tongue is white and cracked, redness to throat.

## 2012-12-17 NOTE — ED Notes (Signed)
MD Bednar at bedside 

## 2013-02-09 DIAGNOSIS — H8120 Vestibular neuronitis, unspecified ear: Secondary | ICD-10-CM | POA: Diagnosis not present

## 2013-02-12 ENCOUNTER — Emergency Department (HOSPITAL_COMMUNITY)
Admission: EM | Admit: 2013-02-12 | Discharge: 2013-02-12 | Disposition: A | Discharge status: Home / Self Care | Payer: Medicare Other | Attending: Emergency Medicine | Admitting: Emergency Medicine

## 2013-02-12 ENCOUNTER — Encounter (HOSPITAL_COMMUNITY): Payer: Self-pay | Admitting: Emergency Medicine

## 2013-02-12 DIAGNOSIS — Z7982 Long term (current) use of aspirin: Secondary | ICD-10-CM | POA: Diagnosis not present

## 2013-02-12 DIAGNOSIS — R51 Headache: Secondary | ICD-10-CM | POA: Diagnosis not present

## 2013-02-12 DIAGNOSIS — Z79899 Other long term (current) drug therapy: Secondary | ICD-10-CM | POA: Diagnosis not present

## 2013-02-12 DIAGNOSIS — E119 Type 2 diabetes mellitus without complications: Secondary | ICD-10-CM | POA: Insufficient documentation

## 2013-02-12 DIAGNOSIS — I10 Essential (primary) hypertension: Secondary | ICD-10-CM | POA: Diagnosis not present

## 2013-02-12 DIAGNOSIS — F17200 Nicotine dependence, unspecified, uncomplicated: Secondary | ICD-10-CM | POA: Insufficient documentation

## 2013-02-12 DIAGNOSIS — H938X9 Other specified disorders of ear, unspecified ear: Secondary | ICD-10-CM | POA: Diagnosis not present

## 2013-02-12 DIAGNOSIS — H6592 Unspecified nonsuppurative otitis media, left ear: Secondary | ICD-10-CM

## 2013-02-12 DIAGNOSIS — E7800 Pure hypercholesterolemia, unspecified: Secondary | ICD-10-CM | POA: Diagnosis not present

## 2013-02-12 DIAGNOSIS — G40909 Epilepsy, unspecified, not intractable, without status epilepticus: Secondary | ICD-10-CM | POA: Insufficient documentation

## 2013-02-12 DIAGNOSIS — R42 Dizziness and giddiness: Secondary | ICD-10-CM | POA: Insufficient documentation

## 2013-02-12 DIAGNOSIS — H65199 Other acute nonsuppurative otitis media, unspecified ear: Secondary | ICD-10-CM | POA: Diagnosis not present

## 2013-02-12 HISTORY — DX: Pure hypercholesterolemia, unspecified: E78.00

## 2013-02-12 HISTORY — DX: Essential (primary) hypertension: I10

## 2013-02-12 LAB — COMPREHENSIVE METABOLIC PANEL
Albumin: 3.6 g/dL (ref 3.5–5.2)
BUN: 17 mg/dL (ref 6–23)
Calcium: 9.3 mg/dL (ref 8.4–10.5)
Creatinine, Ser: 0.85 mg/dL (ref 0.50–1.35)
Total Protein: 6.8 g/dL (ref 6.0–8.3)

## 2013-02-12 LAB — CBC WITH DIFFERENTIAL/PLATELET
Basophils Relative: 1 % (ref 0–1)
Eosinophils Absolute: 0.4 10*3/uL (ref 0.0–0.7)
HCT: 44.6 % (ref 39.0–52.0)
Hemoglobin: 16 g/dL (ref 13.0–17.0)
MCH: 32.5 pg (ref 26.0–34.0)
MCHC: 35.9 g/dL (ref 30.0–36.0)
Monocytes Absolute: 1 10*3/uL (ref 0.1–1.0)
Monocytes Relative: 12 % (ref 3–12)

## 2013-02-12 NOTE — ED Notes (Signed)
PT. REPORTS HEADACHE AND LEFT EAR ACHE FOR SEVERAL DAYS UNRELIEVED BY OTC TYLENOL . DENIES INJURY. AMBULATORY /ALERT AND ORIENTED.

## 2013-02-12 NOTE — ED Provider Notes (Signed)
History     CSN: [id number]  Arrival date & time 02/12/13  2027   First MD Initiated Contact with Patient 02/12/13 2124      Chief Complaint  Patient presents with  . Headache  . Otalgia    (Consider location/radiation/quality/duration/timing/severity/associated sxs/prior treatment) The history is provided by the patient.  Trevor [name] is a 67 y.o. male history of seizures, diabetes here presenting with ear pain and occasional headaches. Bilateral year pain worse on the left for the last 3 weeks. Initially has cerumen impaction and so was given some eardrops. He saw his PMD 4 days ago and was diagnosed with some middle ear effusion worse on the left. He was started on azithromycin and was given meclizine for dizziness. He said since then he still feeling ill dizzy and has occasional headaches. Denies any nausea vomiting. Denies any hearing loss. He also has some ringing in his years and is worse at night and worse in the left ear. Denies any fevers chills.   Past Medical History  Diagnosis Date  . Seizures   . Diabetes mellitus without complication   . Hypertension   . Hypercholesterolemia     History reviewed. No pertinent past surgical history.  No family history on file.  History  Substance Use Topics  . Smoking status: Current Every Day Smoker  . Smokeless tobacco: Not on file  . Alcohol Use: No      Review of Systems  HENT: Positive for ear pain.   Neurological: Positive for headaches.  All other systems reviewed and are negative.    Allergies  Penicillins and Latex  Home Medications   Current Outpatient Rx  Name  Route  Sig  Dispense  Refill  . aspirin 81 MG chewable tablet   Oral   Chew 81 mg by mouth daily.         . divalproex (DEPAKOTE ER) 500 MG 24 hr tablet   Oral   Take 500 mg by mouth every evening.         . finasteride (PROSCAR) 5 MG tablet   Oral   Take 5 mg by mouth every evening.         . metFORMIN (GLUCOPHAGE) 500 MG  tablet   Oral   Take 500 mg by mouth 2 (two) times daily with a meal.         . Nutritional Supplements (COLD AND FLU PO)   Oral   Take 2 tablets by mouth 2 (two) times daily as needed. For cold and flu symptoms         . omeprazole (PRILOSEC) 20 MG capsule   Oral   Take 20 mg by mouth 2 (two) times daily.         . phenol-menthol (CEPASTAT) 14.5 MG lozenge   Oral   Take 1 lozenge by mouth every 4 (four) hours as needed. For sore throat         . pravastatin (PRAVACHOL) 20 MG tablet   Oral   Take 20 mg by mouth daily.         . predniSONE (DELTASONE) 20 MG tablet      2 tabs po daily x 4 days   8 tablet   0     BP 157/72  Pulse 66  Temp(Src) 99.2 F (37.3 C) (Oral)  Resp 16  SpO2 98%  Physical Exam  Nursing note and vitals reviewed. Constitutional: He is oriented to person, place, and time. He appears well-developed and  well-nourished.  HENT:  Head: Normocephalic.  Right Ear: External ear normal.  Mouth/Throat: Oropharynx is clear and moist.  L TM with effusion. TM not red.   Eyes: Conjunctivae are normal. Pupils are equal, round, and reactive to light.  Neck: Normal range of motion. Neck supple.  Cardiovascular: Normal rate, regular rhythm and normal heart sounds.   Pulmonary/Chest: Effort normal and breath sounds normal. No respiratory distress. He has no wheezes. He has no rales.  Abdominal: Soft. Bowel sounds are normal. He exhibits no distension. There is no tenderness. There is no rebound.  Musculoskeletal: Normal range of motion.  Neurological: He is alert and oriented to person, place, and time.  Skin: Skin is warm and dry.  Psychiatric: He has a normal mood and affect. His behavior is normal. Judgment and thought content normal.    ED Course  Procedures (including critical care time)  Labs Reviewed  COMPREHENSIVE METABOLIC PANEL - Abnormal; Notable for the following:    Glucose, Bld 126 (*)    GFR calc non Af Amer 89 (*)    All other  components within normal limits  CBC WITH DIFFERENTIAL   No results found.   No diagnosis found.    MDM  Trevor [name] is a 67 y.o. male here with ear pain and dizziness. I think dizziness is likely from the effusion. I don't think he has otitis media right now. He may have new onset meniere's disease. I recommend finish his azithromycin and continue with meclizine prn dizziness. He should see ENT doctor for further workup and treatment.          Richardean Canal, MD 02/12/13 2154

## 2013-02-12 NOTE — ED Notes (Signed)
Pt is complaining of chronic posterior head pain. Pt says it is usually manageable, but today it was unbearable. Pt is reporting 6/10 for posterior head pain, and it is usually at a 2/10. Pt is also complaining of Left ear pain. Ear is red on assessment.

## 2014-02-21 ENCOUNTER — Encounter (HOSPITAL_COMMUNITY): Payer: Self-pay | Admitting: Emergency Medicine

## 2014-02-21 ENCOUNTER — Other Ambulatory Visit: Payer: Self-pay

## 2014-02-21 ENCOUNTER — Emergency Department (HOSPITAL_COMMUNITY)
Admission: EM | Admit: 2014-02-21 | Discharge: 2014-02-21 | Disposition: A | Discharge status: Home / Self Care | Payer: Medicare Other | Attending: Emergency Medicine | Admitting: Emergency Medicine

## 2014-02-21 ENCOUNTER — Emergency Department (HOSPITAL_COMMUNITY): Payer: Medicare Other

## 2014-02-21 DIAGNOSIS — J111 Influenza due to unidentified influenza virus with other respiratory manifestations: Secondary | ICD-10-CM | POA: Diagnosis not present

## 2014-02-21 DIAGNOSIS — E119 Type 2 diabetes mellitus without complications: Secondary | ICD-10-CM | POA: Insufficient documentation

## 2014-02-21 DIAGNOSIS — Z79899 Other long term (current) drug therapy: Secondary | ICD-10-CM | POA: Diagnosis not present

## 2014-02-21 DIAGNOSIS — E7800 Pure hypercholesterolemia, unspecified: Secondary | ICD-10-CM | POA: Diagnosis not present

## 2014-02-21 DIAGNOSIS — Z91040 Latex allergy status: Secondary | ICD-10-CM | POA: Diagnosis not present

## 2014-02-21 DIAGNOSIS — Z880 Allergy status to penicillin: Secondary | ICD-10-CM | POA: Insufficient documentation

## 2014-02-21 DIAGNOSIS — R5383 Other fatigue: Secondary | ICD-10-CM | POA: Diagnosis not present

## 2014-02-21 DIAGNOSIS — G40909 Epilepsy, unspecified, not intractable, without status epilepticus: Secondary | ICD-10-CM | POA: Diagnosis not present

## 2014-02-21 DIAGNOSIS — F17200 Nicotine dependence, unspecified, uncomplicated: Secondary | ICD-10-CM | POA: Diagnosis not present

## 2014-02-21 DIAGNOSIS — Z7982 Long term (current) use of aspirin: Secondary | ICD-10-CM | POA: Insufficient documentation

## 2014-02-21 DIAGNOSIS — R5381 Other malaise: Secondary | ICD-10-CM | POA: Diagnosis not present

## 2014-02-21 DIAGNOSIS — R69 Illness, unspecified: Secondary | ICD-10-CM

## 2014-02-21 DIAGNOSIS — J449 Chronic obstructive pulmonary disease, unspecified: Secondary | ICD-10-CM | POA: Diagnosis not present

## 2014-02-21 DIAGNOSIS — I10 Essential (primary) hypertension: Secondary | ICD-10-CM | POA: Insufficient documentation

## 2014-02-21 LAB — CBG MONITORING, ED
GLUCOSE-CAPILLARY: 143 mg/dL — AB (ref 70–99)
GLUCOSE-CAPILLARY: 147 mg/dL — AB (ref 70–99)

## 2014-02-21 LAB — URINALYSIS, ROUTINE W REFLEX MICROSCOPIC
Glucose, UA: NEGATIVE mg/dL
Leukocytes, UA: NEGATIVE
NITRITE: NEGATIVE
PROTEIN: NEGATIVE mg/dL
SPECIFIC GRAVITY, URINE: 1.028 (ref 1.005–1.030)
UROBILINOGEN UA: 1 mg/dL (ref 0.0–1.0)
pH: 6 (ref 5.0–8.0)

## 2014-02-21 LAB — URINE MICROSCOPIC-ADD ON

## 2014-02-21 LAB — COMPREHENSIVE METABOLIC PANEL
ALT: 46 U/L (ref 0–53)
AST: 24 U/L (ref 0–37)
Albumin: 4.3 g/dL (ref 3.5–5.2)
Alkaline Phosphatase: 47 U/L (ref 39–117)
BILIRUBIN TOTAL: 0.7 mg/dL (ref 0.3–1.2)
BUN: 16 mg/dL (ref 6–23)
CALCIUM: 9.9 mg/dL (ref 8.4–10.5)
CHLORIDE: 95 meq/L — AB (ref 96–112)
CO2: 26 meq/L (ref 19–32)
Creatinine, Ser: 0.99 mg/dL (ref 0.50–1.35)
GFR, EST NON AFRICAN AMERICAN: 83 mL/min — AB (ref >90)
GLUCOSE: 152 mg/dL — AB (ref 70–99)
Potassium: 4.5 mEq/L (ref 3.7–5.3)
SODIUM: 136 meq/L — AB (ref 137–147)
Total Protein: 7.6 g/dL (ref 6.0–8.3)

## 2014-02-21 LAB — CBC
HEMATOCRIT: 47.4 % (ref 39.0–52.0)
HEMOGLOBIN: 16.5 g/dL (ref 13.0–17.0)
MCH: 32.1 pg (ref 26.0–34.0)
MCHC: 34.8 g/dL (ref 30.0–36.0)
MCV: 92.2 fL (ref 78.0–100.0)
Platelets: 213 10*3/uL (ref 150–400)
RBC: 5.14 MIL/uL (ref 4.22–5.81)
RDW: 13.5 % (ref 11.5–15.5)
WBC: 11.6 10*3/uL — ABNORMAL HIGH (ref 4.0–10.5)

## 2014-02-21 MED ORDER — SODIUM CHLORIDE 0.9 % IV BOLUS (SEPSIS)
1000.0000 mL | Freq: Once | INTRAVENOUS | Status: AC
  Administered 2014-02-21: 1000 mL via INTRAVENOUS

## 2014-02-21 MED ORDER — OSELTAMIVIR PHOSPHATE 75 MG PO CAPS
75.0000 mg | ORAL_CAPSULE | Freq: Once | ORAL | Status: AC
  Administered 2014-02-21: 75 mg via ORAL
  Filled 2014-02-21: qty 1

## 2014-02-21 MED ORDER — ACETAMINOPHEN 325 MG PO TABS
650.0000 mg | ORAL_TABLET | Freq: Once | ORAL | Status: AC
  Administered 2014-02-21: 650 mg via ORAL
  Filled 2014-02-21: qty 2

## 2014-02-21 MED ORDER — OSELTAMIVIR PHOSPHATE 75 MG PO CAPS: 75.0000 mg | ORAL_CAPSULE | Freq: Two times a day (BID) | ORAL | Status: DC

## 2014-02-21 NOTE — ED Notes (Addendum)
Pt reports starting last night he became dizzy/lightheaded and nauseated, his blood sugar and bp was elevated.  Pt reports some sob and abdominal pain as well. Denies cough.

## 2014-02-21 NOTE — Discharge Instructions (Signed)

## 2014-02-21 NOTE — ED Notes (Signed)
Pt's CBG is 147mg /dl. RN notified.

## 2014-02-21 NOTE — ED Provider Notes (Signed)
CSN: [id number]     Arrival date & time 02/21/14  1516 History   First MD Initiated Contact with Patient 02/21/14 1608     Chief Complaint  Patient presents with  . Dizziness     (Consider location/radiation/quality/duration/timing/severity/associated sxs/prior Treatment) Patient is a 68 y.o. male presenting with flu symptoms.  Influenza Presenting symptoms: cough, fatigue, fever, myalgias, nausea and rhinorrhea   Presenting symptoms: no diarrhea, no headaches, no shortness of breath, no sore throat and no vomiting   Cough:    Cough characteristics:  Dry   Sputum characteristics:  Nondescript   Severity:  Mild   Duration:  2 days   Timing:  Intermittent   Progression:  Unchanged   Chronicity:  New Fatigue:    Severity:  Moderate   Duration:  2 days   Timing:  Constant   Progression:  Unchanged Fever:    Duration:  2 days   Timing:  Intermittent   Max temp PTA (F):  102   Temp source:  Oral   Progression:  Waxing and waning Myalgias:    Location:  Generalized   Quality:  Aching   Severity:  Moderate   Duration:  2 days   Timing:  Intermittent   Progression:  Waxing and waning Nausea:    Severity:  Moderate   Duration:  2 days   Timing:  Intermittent   Progression:  Waxing and waning Severity:  Mild Duration:  2 days Progression:  Waxing and waning Chronicity:  New Worsened by:  Nothing tried Ineffective treatments:  None tried Associated symptoms: chills, decreased appetite and decreased physical activity   Associated symptoms: no mental status change, no congestion, no neck stiffness and no witnessed syncope   Risk factors: being elderly and diabetes   Risk factors: no sick contacts     Past Medical History  Diagnosis Date  . Seizures   . Diabetes mellitus without complication   . Hypertension   . Hypercholesterolemia    History reviewed. No pertinent past surgical history. No family history on file. History  Substance Use Topics  . Smoking status:  Current Every Day Smoker  . Smokeless tobacco: Not on file  . Alcohol Use: No    Review of Systems  Constitutional: Positive for fever, chills, activity change, appetite change, fatigue and decreased appetite.  HENT: Positive for rhinorrhea. Negative for congestion, facial swelling, sore throat and trouble swallowing.   Eyes: Negative for photophobia and pain.  Respiratory: Positive for cough. Negative for chest tightness and shortness of breath.   Cardiovascular: Negative for chest pain and leg swelling.  Gastrointestinal: Positive for nausea. Negative for vomiting, abdominal pain, diarrhea and constipation.  Endocrine: Negative for polydipsia and polyuria.  Genitourinary: Negative for dysuria, urgency, decreased urine volume and difficulty urinating.  Musculoskeletal: Positive for myalgias. Negative for back pain, gait problem and neck stiffness.  Skin: Negative for color change, rash and wound.  Allergic/Immunologic: Negative for immunocompromised state.  Neurological: Negative for dizziness, facial asymmetry, speech difficulty, weakness, numbness and headaches.  Psychiatric/Behavioral: Negative for confusion, decreased concentration and agitation.      Allergies  Penicillins and Latex  Home Medications   Current Outpatient Rx  Name  Route  Sig  Dispense  Refill  . aspirin 81 MG chewable tablet   Oral   Chew 81 mg by mouth daily.         Marland Kitchen atenolol (TENORMIN) 100 MG tablet   Oral   Take 100 mg by mouth daily.         Marland Kitchen  divalproex (DEPAKOTE ER) 500 MG 24 hr tablet   Oral   Take 500 mg by mouth every evening.         . finasteride (PROSCAR) 5 MG tablet   Oral   Take 5 mg by mouth every evening.         . Hyprom-Naphaz-Polysorb-Zn Sulf (CLEAR EYES COMPLETE OP)   Both Eyes   Place 2 drops into both eyes daily as needed (dry eyes).         Marland Kitchen lisinopril (PRINIVIL,ZESTRIL) 40 MG tablet   Oral   Take 40 mg by mouth daily.         . meclizine (ANTIVERT) 25  MG tablet   Oral   Take 25 mg by mouth 3 (three) times daily as needed for dizziness.         . metFORMIN (GLUCOPHAGE) 1000 MG tablet   Oral   Take 1,000 mg by mouth 2 (two) times daily with a meal.         . Multiple Vitamins-Minerals (ICAPS AREDS FORMULA PO)   Oral   Take 1 tablet by mouth daily.         . naproxen sodium (ANAPROX) 220 MG tablet   Oral   Take 440 mg by mouth daily as needed (pain).         Marland Kitchen omeprazole (PRILOSEC) 20 MG capsule   Oral   Take 20 mg by mouth 2 (two) times daily.         . pravastatin (PRAVACHOL) 80 MG tablet   Oral   Take 80 mg by mouth every evening.         Marland Kitchen oseltamivir (TAMIFLU) 75 MG capsule   Oral   Take 1 capsule (75 mg total) by mouth every 12 (twelve) hours. Starting 02/22/14   9 capsule   0    BP 142/58  Pulse 83  Temp(Src) 100.1 F (37.8 C) (Oral)  Resp 16  SpO2 96% Physical Exam  Constitutional: He is oriented to person, place, and time. He appears well-developed and well-nourished. No distress.  HENT:  Head: Normocephalic and atraumatic.  Mouth/Throat: No oropharyngeal exudate.  Eyes: Pupils are equal, round, and reactive to light.  Neck: Normal range of motion. Neck supple.  Cardiovascular: Normal rate, regular rhythm and normal heart sounds.  Exam reveals no gallop and no friction rub.   No murmur heard. Pulmonary/Chest: Effort normal and breath sounds normal. No respiratory distress. He has no wheezes. He has no rales.  Abdominal: Soft. Bowel sounds are normal. He exhibits no distension and no mass. There is no tenderness. There is no rebound and no guarding.  Musculoskeletal: Normal range of motion. He exhibits no edema and no tenderness.  Neurological: He is alert and oriented to person, place, and time.  Skin: Skin is warm and dry.  Psychiatric: He has a normal mood and affect.    ED Course  Procedures (including critical care time) Labs Review Labs Reviewed  CBC - Abnormal; Notable for the  following:    WBC 11.6 (*)    All other components within normal limits  COMPREHENSIVE METABOLIC PANEL - Abnormal; Notable for the following:    Sodium 136 (*)    Chloride 95 (*)    Glucose, Bld 152 (*)    GFR calc non Af Amer 83 (*)    All other components within normal limits  URINALYSIS, ROUTINE W REFLEX MICROSCOPIC - Abnormal; Notable for the following:    Hgb urine dipstick SMALL (*)  Bilirubin Urine SMALL (*)    Ketones, ur >80 (*)    All other components within normal limits  CBG MONITORING, ED - Abnormal; Notable for the following:    Glucose-Capillary 143 (*)    All other components within normal limits  CBG MONITORING, ED - Abnormal; Notable for the following:    Glucose-Capillary 147 (*)    All other components within normal limits  URINE CULTURE  URINE MICROSCOPIC-ADD ON   Imaging Review Dg Chest 2 View  02/21/2014   CLINICAL DATA:  Dizziness with history of diabetes and hypertension  EXAM: CHEST  2 VIEW  COMPARISON:  DG CHEST 2 VIEW dated 12/17/2012  FINDINGS: The lungs are mildly hyperinflated. There is no focal infiltrate. The cardiac silhouette is normal in size. The pulmonary vascularity is not engorged. The mediastinum is normal in width. There is no pleural effusion or pneumothorax. The observed portions of the bony thorax appear normal.  IMPRESSION: There is mild hyperinflation consistent with COPD. There is no evidence of pneumonia nor CHF or other acute cardiopulmonary disease.   Electronically Signed   By: Deanglo  [place]   On: 02/21/2014 16:42     EKG Interpretation   Date/Time:  Sunday February 21 2014 14:33:34 EDT Ventricular Rate:  92 PR Interval:  128 QRS Duration: 72 QT Interval:  338 QTC Calculation: 417 R Axis:   54 Text Interpretation:  Normal sinus rhythm Nonspecific ST and T wave  abnormality Abnormal ECG No significant change was found Confirmed by  DOCHERTY  MD, MEGAN ([id number]) on 02/21/2014 7:16:28 PM      MDM   Final diagnoses:  Influenza-like  illness    Pt is a 68 y.o. male with Pmhx as above who presents with about 36 hours of generalized fatigue, fever, chills, myalgias, decreased PO intake, lightheadedness, and mild cough. He also states BP and glucose have been labile. No CP, SOB, ab pain, vomiting, diarrhea, or urinary symptoms. No sick contacts.  On PE, Pt febrile, VS otherwise stable. Cardiopulm & abdominal exam benign. CXR w/o acute findings, Urine not infected, CMP with mild hyponatremia, hypochloremia. Pt given 2L NS, & tylenol w/ improvement of symptoms. Suspect influenza-like illness. Tamiflu given. Pt tolerated PO in dept.  Return precautions given for new or worsening symptoms including inability to tolerate PO, CP, SOB. [facility] days.          Neta Ehlers, MD 02/21/14 [phone]

## 2014-02-23 ENCOUNTER — Encounter (HOSPITAL_COMMUNITY): Payer: Self-pay | Admitting: Emergency Medicine

## 2014-02-23 ENCOUNTER — Emergency Department (HOSPITAL_COMMUNITY)
Admission: EM | Admit: 2014-02-23 | Discharge: 2014-02-23 | Disposition: A | Discharge status: Home / Self Care | Payer: Medicare Other | Attending: Emergency Medicine | Admitting: Emergency Medicine

## 2014-02-23 DIAGNOSIS — I10 Essential (primary) hypertension: Secondary | ICD-10-CM | POA: Insufficient documentation

## 2014-02-23 DIAGNOSIS — F17200 Nicotine dependence, unspecified, uncomplicated: Secondary | ICD-10-CM | POA: Diagnosis not present

## 2014-02-23 DIAGNOSIS — R220 Localized swelling, mass and lump, head: Secondary | ICD-10-CM

## 2014-02-23 DIAGNOSIS — R5381 Other malaise: Secondary | ICD-10-CM | POA: Insufficient documentation

## 2014-02-23 DIAGNOSIS — H02849 Edema of unspecified eye, unspecified eyelid: Secondary | ICD-10-CM | POA: Diagnosis not present

## 2014-02-23 DIAGNOSIS — R509 Fever, unspecified: Secondary | ICD-10-CM | POA: Insufficient documentation

## 2014-02-23 DIAGNOSIS — Z91040 Latex allergy status: Secondary | ICD-10-CM | POA: Diagnosis not present

## 2014-02-23 DIAGNOSIS — Z880 Allergy status to penicillin: Secondary | ICD-10-CM | POA: Insufficient documentation

## 2014-02-23 DIAGNOSIS — T50905A Adverse effect of unspecified drugs, medicaments and biological substances, initial encounter: Secondary | ICD-10-CM

## 2014-02-23 DIAGNOSIS — R221 Localized swelling, mass and lump, neck: Principal | ICD-10-CM

## 2014-02-23 DIAGNOSIS — T375X5A Adverse effect of antiviral drugs, initial encounter: Secondary | ICD-10-CM | POA: Insufficient documentation

## 2014-02-23 DIAGNOSIS — R059 Cough, unspecified: Secondary | ICD-10-CM | POA: Diagnosis not present

## 2014-02-23 DIAGNOSIS — G40909 Epilepsy, unspecified, not intractable, without status epilepticus: Secondary | ICD-10-CM | POA: Diagnosis not present

## 2014-02-23 DIAGNOSIS — Z79899 Other long term (current) drug therapy: Secondary | ICD-10-CM | POA: Diagnosis not present

## 2014-02-23 DIAGNOSIS — R5383 Other fatigue: Secondary | ICD-10-CM | POA: Diagnosis not present

## 2014-02-23 DIAGNOSIS — E7800 Pure hypercholesterolemia, unspecified: Secondary | ICD-10-CM | POA: Insufficient documentation

## 2014-02-23 DIAGNOSIS — E119 Type 2 diabetes mellitus without complications: Secondary | ICD-10-CM | POA: Diagnosis not present

## 2014-02-23 DIAGNOSIS — Z7982 Long term (current) use of aspirin: Secondary | ICD-10-CM | POA: Insufficient documentation

## 2014-02-23 DIAGNOSIS — R05 Cough: Secondary | ICD-10-CM | POA: Insufficient documentation

## 2014-02-23 LAB — CBC WITH DIFFERENTIAL/PLATELET
BASOS PCT: 0 % (ref 0–1)
Basophils Absolute: 0 10*3/uL (ref 0.0–0.1)
EOS ABS: 0 10*3/uL (ref 0.0–0.7)
EOS PCT: 0 % (ref 0–5)
HCT: 42.1 % (ref 39.0–52.0)
Hemoglobin: 14.4 g/dL (ref 13.0–17.0)
LYMPHS ABS: 1.5 10*3/uL (ref 0.7–4.0)
Lymphocytes Relative: 19 % (ref 12–46)
MCH: 31 pg (ref 26.0–34.0)
MCHC: 34.2 g/dL (ref 30.0–36.0)
MCV: 90.5 fL (ref 78.0–100.0)
Monocytes Absolute: 0.9 10*3/uL (ref 0.1–1.0)
Monocytes Relative: 11 % (ref 3–12)
NEUTROS PCT: 69 % (ref 43–77)
Neutro Abs: 5.4 10*3/uL (ref 1.7–7.7)
PLATELETS: 186 10*3/uL (ref 150–400)
RBC: 4.65 MIL/uL (ref 4.22–5.81)
RDW: 13.5 % (ref 11.5–15.5)
WBC: 7.8 10*3/uL (ref 4.0–10.5)

## 2014-02-23 LAB — URINE CULTURE
COLONY COUNT: NO GROWTH
Culture: NO GROWTH

## 2014-02-23 LAB — BASIC METABOLIC PANEL
BUN: 16 mg/dL (ref 6–23)
CALCIUM: 9.1 mg/dL (ref 8.4–10.5)
CO2: 21 mEq/L (ref 19–32)
Chloride: 100 mEq/L (ref 96–112)
Creatinine, Ser: 0.83 mg/dL (ref 0.50–1.35)
GFR, EST NON AFRICAN AMERICAN: 90 mL/min — AB (ref >90)
GLUCOSE: 151 mg/dL — AB (ref 70–99)
Potassium: 4.3 mEq/L (ref 3.7–5.3)
SODIUM: 137 meq/L (ref 137–147)

## 2014-02-23 MED ORDER — MORPHINE SULFATE 4 MG/ML IJ SOLN
4.0000 mg | INTRAMUSCULAR | Status: DC | PRN
  Administered 2014-02-23: 4 mg via INTRAVENOUS
  Filled 2014-02-23: qty 1

## 2014-02-23 MED ORDER — DEXAMETHASONE SODIUM PHOSPHATE 10 MG/ML IJ SOLN
10.0000 mg | Freq: Once | INTRAMUSCULAR | Status: AC
  Administered 2014-02-23: 10 mg via INTRAVENOUS
  Filled 2014-02-23: qty 1

## 2014-02-23 MED ORDER — DIPHENHYDRAMINE HCL 50 MG/ML IJ SOLN
25.0000 mg | Freq: Once | INTRAMUSCULAR | Status: AC
  Administered 2014-02-23: 25 mg via INTRAVENOUS
  Filled 2014-02-23: qty 1

## 2014-02-23 MED ORDER — ONDANSETRON HCL 4 MG/2ML IJ SOLN
4.0000 mg | Freq: Once | INTRAMUSCULAR | Status: AC
  Administered 2014-02-23: 4 mg via INTRAVENOUS
  Filled 2014-02-23: qty 2

## 2014-02-23 MED ORDER — PREDNISONE 10 MG PO TABS: 20.0000 mg | ORAL_TABLET | Freq: Every day | ORAL | Status: DC

## 2014-02-23 MED ORDER — FAMOTIDINE IN NACL 20-0.9 MG/50ML-% IV SOLN
20.0000 mg | Freq: Once | INTRAVENOUS | Status: AC
  Administered 2014-02-23: 20 mg via INTRAVENOUS
  Filled 2014-02-23: qty 50

## 2014-02-23 NOTE — Discharge Instructions (Signed)
The swelling in your face is from a reaction to the Tamiflu Medication.  Stop taking Tamiflu immediately.  Lisinopril can also cause facial swelling, so stop the Lisinopril also.  Prescription for Prednisone (A steroid) to help reduce the swelling.  Re check in ER if you develop a Temperature over 100.5, or if you feel swelling in you mouth, throat, or tongue, or with any difficulty breathing.

## 2014-02-23 NOTE — ED Notes (Signed)
Pt reports that he was seen here on Saturday and dx with the flu. States that he was placed on Tamiflu. Now with swelling to the left side of his face and eye. Does take lisinopril.

## 2014-02-23 NOTE — ED Provider Notes (Addendum)
CSN: [id number]     Arrival date & time 02/23/14  0807 History   First MD Initiated Contact with Patient 02/23/14 [phone]     Chief Complaint  Patient presents with  . Facial Swelling      HPI  Patient presents with forehead and upper facial swelling. Seen and evaluated 3 days ago. Flulike illness with myalgias dry cough and fever and otherwise normal exam. Discharge on Tamiflu. At the first of Saturday night. Today is Tuesday. Yesterday started noticing some swelling of 400 this morning and some swelling to the size and bilateral temples. No swelling to the tongue lips or sensation of swelling in the throat or tightness in the chest or difficulty breathing. His myalgias and fever have improved. No shortness of breath or difficulty breathing.  Past Medical History  Diagnosis Date  . Seizures   . Diabetes mellitus without complication   . Hypertension   . Hypercholesterolemia    History reviewed. No pertinent past surgical history. History reviewed. No pertinent family history. History  Substance Use Topics  . Smoking status: Current Every Day Smoker  . Smokeless tobacco: Not on file  . Alcohol Use: No    Review of Systems  Constitutional: Negative for fever, chills, diaphoresis, appetite change and fatigue.  HENT: Negative for mouth sores, sore throat and trouble swallowing.        Forehead and facial swelling  Eyes: Negative for visual disturbance.  Respiratory: Negative for cough, chest tightness, shortness of breath and wheezing.   Cardiovascular: Negative for chest pain.  Gastrointestinal: Negative for nausea, vomiting, abdominal pain, diarrhea and abdominal distention.  Endocrine: Negative for polydipsia, polyphagia and polyuria.  Genitourinary: Negative for dysuria, frequency and hematuria.  Musculoskeletal: Negative for gait problem.  Skin: Negative for color change, pallor and rash.  Neurological: Negative for dizziness, syncope, light-headedness and headaches.   Hematological: Does not bruise/bleed easily.  Psychiatric/Behavioral: Negative for behavioral problems and confusion.      Allergies  Penicillins and Latex  Home Medications   Current Outpatient Rx  Name  Route  Sig  Dispense  Refill  . aspirin 81 MG chewable tablet   Oral   Chew 81 mg by mouth daily.         Marland Kitchen atenolol (TENORMIN) 100 MG tablet   Oral   Take 100 mg by mouth daily.         . divalproex (DEPAKOTE ER) 500 MG 24 hr tablet   Oral   Take 500 mg by mouth every evening.         . finasteride (PROSCAR) 5 MG tablet   Oral   Take 5 mg by mouth every evening.         . Hyprom-Naphaz-Polysorb-Zn Sulf (CLEAR EYES COMPLETE OP)   Both Eyes   Place 2 drops into both eyes daily as needed (dry eyes).         Marland Kitchen lisinopril (PRINIVIL,ZESTRIL) 40 MG tablet   Oral   Take 40 mg by mouth daily.         . meclizine (ANTIVERT) 25 MG tablet   Oral   Take 25 mg by mouth 3 (three) times daily as needed for dizziness.         . metFORMIN (GLUCOPHAGE) 1000 MG tablet   Oral   Take 1,000 mg by mouth 2 (two) times daily with a meal.         . Multiple Vitamins-Minerals (ICAPS AREDS FORMULA PO)   Oral   Take 1  tablet by mouth daily.         . naproxen sodium (ANAPROX) 220 MG tablet   Oral   Take 440 mg by mouth daily as needed (pain).         Marland Kitchen omeprazole (PRILOSEC) 20 MG capsule   Oral   Take 20 mg by mouth 2 (two) times daily.         Marland Kitchen oseltamivir (TAMIFLU) 75 MG capsule   Oral   Take 1 capsule (75 mg total) by mouth every 12 (twelve) hours. Starting 02/22/14   9 capsule   0   . pravastatin (PRAVACHOL) 80 MG tablet   Oral   Take 80 mg by mouth every evening.          BP 155/69  Pulse 72  Temp(Src) 98.9 F (37.2 C) (Oral)  Resp 20  Ht 5\' 11"  (1.803 m)  Wt 210 lb (95.255 kg)  BMI 29.30 kg/m2  SpO2 97% Physical Exam  Constitutional: He is oriented to person, place, and time. He appears well-developed and well-nourished. No distress.   HENT:  Head: Normocephalic.    Mouth/Throat:    Eyes: Conjunctivae are normal. Pupils are equal, round, and reactive to light. No scleral icterus.    Neck: Normal range of motion. Neck supple. No thyromegaly present.  Cardiovascular: Normal rate and regular rhythm.  Exam reveals no gallop and no friction rub.   No murmur heard. Pulmonary/Chest: Effort normal and breath sounds normal. No respiratory distress. He has no wheezes. He has no rales.  Normal pulmonary exam. No dyspnea. No wheezing.  Abdominal: Soft. Bowel sounds are normal. He exhibits no distension. There is no tenderness. There is no rebound.  Musculoskeletal: Normal range of motion.  Neurological: He is alert and oriented to person, place, and time.  Skin: Skin is warm and dry. No rash noted.  Psychiatric: He has a normal mood and affect. His behavior is normal.    ED Course  Procedures (including critical care time) Labs Review Labs Reviewed  CBC WITH DIFFERENTIAL  BASIC METABOLIC PANEL   Imaging Review Dg Chest 2 View  02/21/2014   CLINICAL DATA:  Dizziness with history of diabetes and hypertension  EXAM: CHEST  2 VIEW  COMPARISON:  DG CHEST 2 VIEW dated 12/17/2012  FINDINGS: The lungs are mildly hyperinflated. There is no focal infiltrate. The cardiac silhouette is normal in size. The pulmonary vascularity is not engorged. The mediastinum is normal in width. There is no pleural effusion or pneumothorax. The observed portions of the bony thorax appear normal.  IMPRESSION: There is mild hyperinflation consistent with COPD. There is no evidence of pneumonia nor CHF or other acute cardiopulmonary disease.   Electronically Signed   By: Linton  [place]   On: 02/21/2014 16:42     EKG Interpretation None      MDM   Final diagnoses:  None   Facial swelling. After taking Tamiflu. He does take lisinopril as well. We will hold both. . This does not appear to be angioedema.  Appears to be simple soft tissue swelling.    10:31:  No progression.  Feels better,  Afebrile, and with normal WBC, doubt cellulitis.  Will treat with H1 blockers, and prednisone.  Hold both meds. Discuss with PCP before restarting ACEI.    Tanna Furry, MD 02/23/14 [id number]  Tanna Furry, MD 02/23/14 [phone]

## 2014-07-05 DIAGNOSIS — K219 Gastro-esophageal reflux disease without esophagitis: Secondary | ICD-10-CM | POA: Insufficient documentation

## 2014-07-05 DIAGNOSIS — E782 Mixed hyperlipidemia: Secondary | ICD-10-CM | POA: Insufficient documentation

## 2014-07-05 DIAGNOSIS — E0822 Diabetes mellitus due to underlying condition with diabetic chronic kidney disease: Secondary | ICD-10-CM | POA: Insufficient documentation

## 2014-07-05 DIAGNOSIS — N400 Enlarged prostate without lower urinary tract symptoms: Secondary | ICD-10-CM | POA: Insufficient documentation

## 2014-07-05 DIAGNOSIS — R569 Unspecified convulsions: Secondary | ICD-10-CM | POA: Insufficient documentation

## 2014-07-05 DIAGNOSIS — I10 Essential (primary) hypertension: Secondary | ICD-10-CM | POA: Insufficient documentation

## 2014-07-05 DIAGNOSIS — N182 Chronic kidney disease, stage 2 (mild): Secondary | ICD-10-CM

## 2014-07-06 ENCOUNTER — Ambulatory Visit (INDEPENDENT_AMBULATORY_CARE_PROVIDER_SITE_OTHER): Payer: Medicare Other | Admitting: Physician Assistant

## 2014-07-06 ENCOUNTER — Encounter: Payer: Self-pay | Admitting: Physician Assistant

## 2014-07-06 VITALS — BP 140/72 | HR 72 | Temp 98.1°F | Resp 16 | Wt 201.0 lb

## 2014-07-06 DIAGNOSIS — E119 Type 2 diabetes mellitus without complications: Secondary | ICD-10-CM | POA: Diagnosis not present

## 2014-07-06 DIAGNOSIS — L259 Unspecified contact dermatitis, unspecified cause: Secondary | ICD-10-CM | POA: Diagnosis not present

## 2014-07-06 DIAGNOSIS — E7800 Pure hypercholesterolemia, unspecified: Secondary | ICD-10-CM | POA: Diagnosis not present

## 2014-07-06 DIAGNOSIS — I10 Essential (primary) hypertension: Secondary | ICD-10-CM

## 2014-07-06 DIAGNOSIS — L309 Dermatitis, unspecified: Secondary | ICD-10-CM

## 2014-07-06 MED ORDER — PREDNISONE 20 MG PO TABS: ORAL_TABLET | ORAL | Status: DC

## 2014-07-06 MED ORDER — HYDROXYZINE HCL 25 MG PO TABS: 25.0000 mg | ORAL_TABLET | Freq: Three times a day (TID) | ORAL | Status: DC | PRN

## 2014-07-06 MED ORDER — PERMETHRIN 5 % EX CREA: 1.0000 "application " | TOPICAL_CREAM | Freq: Once | CUTANEOUS | Status: DC

## 2014-07-06 NOTE — Patient Instructions (Addendum)
Try the [place] lotion FIRST see directions below and if it is not better after 2-3 days then start on the prednisone The Atarax can be taken AS NEEDED for itching 1/2-1 pill three times a day  Permethrin skin cream What is this medicine? PERMETHRIN (per METH rin) skin cream is used to treat scabies. This medicine may be used for other purposes; ask your health care provider or pharmacist if you have questions. COMMON BRAND NAME(S): Acticin, Elimite What should I tell my health care provider before I take this medicine? They need to know if you have any of these conditions: -asthma -an unusual or allergic reaction to permethrin, veterinary or household insecticides, other medicines, chrysanthemums, foods, dyes, or preservatives -pregnant or trying to get pregnant -breast-feeding How should I use this medicine? This medicine is for external use only. Do not take by mouth. Follow the directions on the prescription label. A bath or shower is NOT recommended before applying this medicine. Thoroughly rub the cream into all skin surfaces, from your head to the soles of your feet. It is important to apply it everywhere on your body, not just where the rash is. Apply the cream between fingers and toe creases, in the folds of the wrist and waistline, in the cleft of the buttocks, on the genitals, and in the belly button. Use a toothpick to apply the cream beneath your fingernails and toenails. Nails should be cut short. If you have little or no hair, or you are applying the cream to an infant or young child, make sure you rub the cream into the neck, scalp, hairline, temples, and forehead. Leave it on for 8 to 14 hours, then remove it by bathing and shampooing. If you are applying this medicine to another person, wear plastic or disposable gloves to protect yourself from infestation. Do not get this medicine in your eyes. If you do, rinse out with plenty of cool tap water. Talk to your pediatrician regarding  the use of this medicine in children. While this drug may be prescribed for children as young as 35 months of age for selected conditions, precautions do apply. Overdosage: If you think you have taken too much of this medicine contact a poison control center or emergency room at once. NOTE: This medicine is only for you. Do not share this medicine with others. What if I miss a dose? This does not apply. What may interact with this medicine? Interactions are not expected. Do not use any other skin products on the affected area without telling your doctor or health care professional. This list may not describe all possible interactions. Give your health care provider a list of all the medicines, herbs, non-prescription drugs, or dietary supplements you use. Also tell them if you smoke, drink alcohol, or use illegal drugs. Some items may interact with your medicine. What should I watch for while using this medicine? It is not unusual for itching and rash to continue for as long as 2 to 4 weeks after treatment. These symptoms may be a temporary reaction to the remains of the mites. This does not mean this cream did not work or that it needs to be reapplied. If you feel that the itching and rash is intense or if it continues beyond 4 weeks, talk to your doctor or health care professional right away. Scabies is spread by direct skin contact with an infected person. Family members and sexual partners may require treatment with this medicine. You should discuss this with your  doctor or health care professional. Using a normal washing cycle, you should wash all clothing, towels and bed linen that has touched your skin. You do not need to rewash clean clothing that has not yet been worn. Coats, furniture, rugs, floors, and walls do not need to be cleaned in any special manner. What side effects may I notice from receiving this medicine? Side effects that usually do not require medical attention (report to your  doctor or health care professional if they continue or are bothersome): -itching -numbness -rash -redness or mild swelling of the skin -stinging or burning -tingling sensation This list may not describe all possible side effects. Call your doctor for medical advice about side effects. You may report side effects to FDA at 1-800-FDA-1088. Where should I keep my medicine? Keep out of the reach of children. Store at room temperature away from heat and direct light. Do not refrigerate or freeze. Throw away any unused medicine after the expiration date. NOTE: This sheet is a summary. It may not cover all possible information. If you have questions about this medicine, talk to your doctor, pharmacist, or health care provider.  2015, Elsevier/Gold Standard. (2008-07-01 14:02:14)
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
## 2014-10-26 ENCOUNTER — Encounter: Payer: Self-pay | Admitting: Internal Medicine

## 2015-06-13 ENCOUNTER — Encounter (HOSPITAL_COMMUNITY): Payer: Self-pay

## 2015-06-13 ENCOUNTER — Emergency Department (HOSPITAL_COMMUNITY)
Admission: EM | Admit: 2015-06-13 | Discharge: 2015-06-13 | Disposition: A | Discharge status: Home / Self Care | Payer: Medicare Other | Attending: Emergency Medicine | Admitting: Emergency Medicine

## 2015-06-13 ENCOUNTER — Emergency Department (HOSPITAL_COMMUNITY): Payer: Medicare Other

## 2015-06-13 DIAGNOSIS — D72829 Elevated white blood cell count, unspecified: Secondary | ICD-10-CM | POA: Diagnosis not present

## 2015-06-13 DIAGNOSIS — E780 Pure hypercholesterolemia: Secondary | ICD-10-CM | POA: Diagnosis not present

## 2015-06-13 DIAGNOSIS — Z720 Tobacco use: Secondary | ICD-10-CM | POA: Insufficient documentation

## 2015-06-13 DIAGNOSIS — J449 Chronic obstructive pulmonary disease, unspecified: Secondary | ICD-10-CM | POA: Diagnosis not present

## 2015-06-13 DIAGNOSIS — Z880 Allergy status to penicillin: Secondary | ICD-10-CM | POA: Diagnosis not present

## 2015-06-13 DIAGNOSIS — N400 Enlarged prostate without lower urinary tract symptoms: Secondary | ICD-10-CM | POA: Diagnosis not present

## 2015-06-13 DIAGNOSIS — W01198A Fall on same level from slipping, tripping and stumbling with subsequent striking against other object, initial encounter: Secondary | ICD-10-CM | POA: Insufficient documentation

## 2015-06-13 DIAGNOSIS — F17200 Nicotine dependence, unspecified, uncomplicated: Secondary | ICD-10-CM | POA: Diagnosis not present

## 2015-06-13 DIAGNOSIS — Y998 Other external cause status: Secondary | ICD-10-CM | POA: Insufficient documentation

## 2015-06-13 DIAGNOSIS — J9801 Acute bronchospasm: Secondary | ICD-10-CM

## 2015-06-13 DIAGNOSIS — Z7952 Long term (current) use of systemic steroids: Secondary | ICD-10-CM | POA: Diagnosis not present

## 2015-06-13 DIAGNOSIS — E119 Type 2 diabetes mellitus without complications: Secondary | ICD-10-CM | POA: Insufficient documentation

## 2015-06-13 DIAGNOSIS — Z79899 Other long term (current) drug therapy: Secondary | ICD-10-CM | POA: Insufficient documentation

## 2015-06-13 DIAGNOSIS — I10 Essential (primary) hypertension: Secondary | ICD-10-CM | POA: Diagnosis not present

## 2015-06-13 DIAGNOSIS — G40909 Epilepsy, unspecified, not intractable, without status epilepticus: Secondary | ICD-10-CM | POA: Insufficient documentation

## 2015-06-13 DIAGNOSIS — K219 Gastro-esophageal reflux disease without esophagitis: Secondary | ICD-10-CM | POA: Insufficient documentation

## 2015-06-13 DIAGNOSIS — Z7982 Long term (current) use of aspirin: Secondary | ICD-10-CM | POA: Insufficient documentation

## 2015-06-13 DIAGNOSIS — Z91040 Latex allergy status: Secondary | ICD-10-CM | POA: Insufficient documentation

## 2015-06-13 DIAGNOSIS — Y9389 Activity, other specified: Secondary | ICD-10-CM | POA: Diagnosis not present

## 2015-06-13 DIAGNOSIS — Y92814 Boat as the place of occurrence of the external cause: Secondary | ICD-10-CM | POA: Insufficient documentation

## 2015-06-13 DIAGNOSIS — R0602 Shortness of breath: Secondary | ICD-10-CM

## 2015-06-13 DIAGNOSIS — T751XXA Unspecified effects of drowning and nonfatal submersion, initial encounter: Secondary | ICD-10-CM | POA: Diagnosis present

## 2015-06-13 DIAGNOSIS — J441 Chronic obstructive pulmonary disease with (acute) exacerbation: Secondary | ICD-10-CM | POA: Insufficient documentation

## 2015-06-13 DIAGNOSIS — R069 Unspecified abnormalities of breathing: Secondary | ICD-10-CM | POA: Diagnosis not present

## 2015-06-13 HISTORY — DX: Chronic obstructive pulmonary disease, unspecified: J44.9

## 2015-06-13 LAB — CBC WITH DIFFERENTIAL/PLATELET
Basophils Absolute: 0 10*3/uL (ref 0.0–0.1)
Basophils Relative: 0 % (ref 0–1)
Eosinophils Absolute: 1.3 10*3/uL — ABNORMAL HIGH (ref 0.0–0.7)
Eosinophils Relative: 8 % — ABNORMAL HIGH (ref 0–5)
HCT: 45.4 % (ref 39.0–52.0)
Hemoglobin: 14.9 g/dL (ref 13.0–17.0)
Lymphocytes Relative: 41 % (ref 12–46)
Lymphs Abs: 6.6 10*3/uL — ABNORMAL HIGH (ref 0.7–4.0)
MCH: 30 pg (ref 26.0–34.0)
MCHC: 32.8 g/dL (ref 30.0–36.0)
MCV: 91.5 fL (ref 78.0–100.0)
Monocytes Absolute: 0.8 10*3/uL (ref 0.1–1.0)
Monocytes Relative: 5 % (ref 3–12)
Neutro Abs: 7.5 10*3/uL (ref 1.7–7.7)
Neutrophils Relative %: 46 % (ref 43–77)
Platelets: 304 10*3/uL (ref 150–400)
RBC: 4.96 MIL/uL (ref 4.22–5.81)
RDW: 13.3 % (ref 11.5–15.5)
WBC: 16.2 10*3/uL — ABNORMAL HIGH (ref 4.0–10.5)

## 2015-06-13 LAB — BASIC METABOLIC PANEL
Anion gap: 18 — ABNORMAL HIGH (ref 5–15)
BUN: 15 mg/dL (ref 6–20)
CALCIUM: 9.1 mg/dL (ref 8.9–10.3)
CO2: 17 mmol/L — ABNORMAL LOW (ref 22–32)
Chloride: 102 mmol/L (ref 101–111)
Creatinine, Ser: 1.29 mg/dL — ABNORMAL HIGH (ref 0.61–1.24)
GFR calc non Af Amer: 56 mL/min — ABNORMAL LOW (ref >60)
Glucose, Bld: 187 mg/dL — ABNORMAL HIGH (ref 65–99)
POTASSIUM: 5 mmol/L (ref 3.5–5.1)
Sodium: 137 mmol/L (ref 135–145)

## 2015-06-13 LAB — TROPONIN I: Troponin I: <0.03 ng/mL (ref <0.031)

## 2015-06-13 LAB — CBG MONITORING, ED: Glucose-Capillary: 221 mg/dL — ABNORMAL HIGH (ref 65–99)

## 2015-06-13 MED ORDER — IPRATROPIUM-ALBUTEROL 0.5-2.5 (3) MG/3ML IN SOLN
3.0000 mL | Freq: Once | RESPIRATORY_TRACT | Status: DC
  Filled 2015-06-13: qty 3

## 2015-06-13 MED ORDER — SODIUM CHLORIDE 0.9 % IV BOLUS (SEPSIS): 1000.0000 mL | Freq: Once | INTRAVENOUS | Status: DC

## 2015-06-13 MED ORDER — ALBUTEROL SULFATE HFA 108 (90 BASE) MCG/ACT IN AERS: 1.0000 | INHALATION_SPRAY | Freq: Four times a day (QID) | RESPIRATORY_TRACT | Status: DC | PRN

## 2015-06-13 MED ORDER — IPRATROPIUM-ALBUTEROL 0.5-2.5 (3) MG/3ML IN SOLN
3.0000 mL | Freq: Once | RESPIRATORY_TRACT | Status: AC
  Administered 2015-06-13: 3 mL via RESPIRATORY_TRACT

## 2015-06-13 MED ORDER — IPRATROPIUM-ALBUTEROL 0.5-2.5 (3) MG/3ML IN SOLN
3.0000 mL | Freq: Once | RESPIRATORY_TRACT | Status: AC
  Administered 2015-06-13: 3 mL via RESPIRATORY_TRACT
  Filled 2015-06-13: qty 3

## 2015-06-13 NOTE — ED Provider Notes (Signed)
Patient is in no distress.  Lungs clear.  CXR normal.   Patient ambulatory maintaining saturations at 96%. He states he feels "100% better". Denies chest pain or shortness of breath. He is not wheezing on exam. He wishes to go home.   his wife will watch him tonight. They'll return if he develops any difficulty breathing or chest pain or any other concerns. He'll be sent home with a bronchodilating inhaler.  BP 120/58 mmHg  Pulse 75  Temp(Src) 98.5 F (36.9 C) (Oral)  Resp 19  Ht 5\' 11"  (1.803 m)  Wt 210 lb (95.255 kg)  BMI 29.30 kg/m2  SpO2 95%   Ezequiel Essex, MD 06/13/15 1934

## 2015-06-13 NOTE — ED Notes (Signed)
96%O2 while ambulating with pulse of 90
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
## 2015-06-13 NOTE — ED Notes (Signed)
CBG 221 

## 2015-06-13 NOTE — Discharge Instructions (Signed)
Bronchospasm °A bronchospasm is a spasm or tightening of the airways going into the lungs. During a bronchospasm breathing becomes more difficult because the airways get smaller. When this happens there can be coughing, a whistling sound when breathing (wheezing), and difficulty breathing. Bronchospasm is often associated with asthma, but not all patients who experience a bronchospasm have asthma. °CAUSES  °A bronchospasm is caused by inflammation or irritation of the airways. The inflammation or irritation may be triggered by:  °· Allergies (such as to animals, pollen, food, or mold). Allergens that cause bronchospasm may cause wheezing immediately after exposure or many hours later.   °· Infection. Viral infections are believed to be the most common cause of bronchospasm.   °· Exercise.   °· Irritants (such as pollution, cigarette smoke, strong odors, aerosol sprays, and paint fumes).   °· Weather changes. Winds increase molds and pollens in the air. Rain refreshes the air by washing irritants out. Cold air may cause inflammation.   °· Stress and emotional upset.   °SIGNS AND SYMPTOMS  °· Wheezing.   °· Excessive nighttime coughing.   °· Frequent or severe coughing with a simple cold.   °· Chest tightness.   °· Shortness of breath.   °DIAGNOSIS  °Bronchospasm is usually diagnosed through a history and physical exam. Tests, such as chest X-rays, are sometimes done to look for other conditions. °TREATMENT  °· Inhaled medicines can be given to open up your airways and help you breathe. The medicines can be given using either an inhaler or a nebulizer machine. °· Corticosteroid medicines may be given for severe bronchospasm, usually when it is associated with asthma. °HOME CARE INSTRUCTIONS  °· Always have a plan prepared for seeking medical care. Know when to call your health care provider and local emergency services (911 in the [place] Know where you can access local emergency care. °· Only take medicines as  directed by your health care provider. °· If you were prescribed an inhaler or nebulizer machine, ask your health care provider to explain how to use it correctly. Always use a spacer with your inhaler if you were given one. °· It is necessary to remain calm during an attack. Try to relax and breathe more slowly.  °· Control your home environment in the following ways:   °¨ Change your heating and air conditioning filter at least once a month.   °¨ Limit your use of fireplaces and wood stoves. °¨ Do not smoke and do not allow smoking in your home.   °¨ Avoid exposure to perfumes and fragrances.   °¨ Get rid of pests (such as roaches and mice) and their droppings.   °¨ Throw away plants if you see mold on them.   °¨ Keep your house clean and dust free.   °¨ Replace carpet with wood, tile, or vinyl flooring. Carpet can trap dander and dust.   °¨ Use allergy-proof pillows, mattress covers, and box spring covers.   °¨ Wash bed sheets and blankets every week in hot water and dry them in a dryer.   °¨ Use blankets that are made of polyester or cotton.   °¨ Wash hands frequently. °SEEK MEDICAL CARE IF:  °· You have muscle aches.   °· You have chest pain.   °· The sputum changes from clear or white to yellow, green, gray, or bloody.   °· The sputum you cough up gets thicker.   °· There are problems that may be related to the medicine you are given, such as a rash, itching, swelling, or trouble breathing.   °SEEK IMMEDIATE MEDICAL CARE IF:  °· You have worsening wheezing and coughing even   after taking your prescribed medicines.   °· You have increased difficulty breathing.   °· You develop severe chest pain. °MAKE SURE YOU:  °· Understand these instructions. °· Will watch your condition. °· Will get help right away if you are not doing well or get worse. °Document Released: 12/06/2003 Document Revised: 12/08/2013 Document Reviewed: 05/25/2013 °ExitCare® Patient Information ©2015 [employer]. This information is not  intended to replace advice given to you by your health care provider. Make sure you discuss any questions you have with your health care provider. ° °
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
## 2015-06-14 LAB — PATHOLOGIST SMEAR REVIEW

## 2015-12-05 ENCOUNTER — Ambulatory Visit (INDEPENDENT_AMBULATORY_CARE_PROVIDER_SITE_OTHER): Payer: Medicare Other | Admitting: Internal Medicine

## 2015-12-05 ENCOUNTER — Encounter: Payer: Self-pay | Admitting: Internal Medicine

## 2015-12-05 VITALS — BP 146/82 | HR 68 | Temp 98.2°F | Resp 18 | Ht 71.0 in | Wt 208.0 lb

## 2015-12-05 DIAGNOSIS — J069 Acute upper respiratory infection, unspecified: Secondary | ICD-10-CM

## 2015-12-05 MED ORDER — PREDNISONE 20 MG PO TABS: ORAL_TABLET | ORAL | Status: DC

## 2015-12-05 MED ORDER — AZITHROMYCIN 250 MG PO TABS
ORAL_TABLET | ORAL | Status: DC
Start: 2015-12-05 — End: 2015-12-22

## 2015-12-05 MED ORDER — FLUTICASONE PROPIONATE 50 MCG/ACT NA SUSP: 2.0000 | Freq: Every day | NASAL | Status: AC

## 2015-12-05 NOTE — Patient Instructions (Addendum)
-  Please please take claritin, zyrtec, or allegra.  Generic store brand is okay.  Get whatever is on sale.  Please take 1 tablet daily.  - Please use nasal saline as much as you can to help rinse your sinuses.  -Please use flonase 2 sprays in each nostril once before bedtime.  -Please take a zpak and finish all the antibiotic.  -Please take prednisone daily as instructed until it is all the way gone.  -Please take 5-10 mL of the phenergan dextromethorphan for coughing.

## 2015-12-05 NOTE — Progress Notes (Signed)
Patient ID: Trevor [name], male   DOB: 1945-12-28, 69 y.o.   MRN: [id number]  HPI  Patient presents to the office for evaluation of cough.  It has been going on for 4 weeks.  Patient reports night > day, dry, barky, worse with lying down.  They also endorse change in voice, postnasal drip and sore throat, nasal congestion with white mucous, sinus headache, ear pain..  They have tried upright position or vinegar and honey mixture, gargling salt water.  They report that nothing has worked.  They denies other sick contacts.  Review of Systems  Constitutional: Negative for fever, chills and malaise/fatigue.  HENT: Positive for congestion, ear pain and sore throat.   Eyes: Negative.   Respiratory: Positive for cough. Negative for shortness of breath and wheezing.   Cardiovascular: Negative for chest pain, palpitations and leg swelling.  Skin: Negative.   Neurological: Negative for headaches.    PE:  Filed Vitals:   12/05/15 1458  BP: 146/82  Pulse: 68  Temp: 98.2 F (36.8 C)  Resp: 18    General:  Alert and non-toxic, WDWN, NAD HEENT: NCAT, PERLA, EOM normal, no occular discharge or erythema.  Nasal mucosal edema with sinus tenderness to palpation.  Oropharynx clear with minimal oropharyngeal edema and erythema.  Mucous membranes moist and pink. Neck:  Cervical adenopathy Chest:  RRR no MRGs.  Lungs clear to auscultation A&P with no wheezes rhonchi or rales.   Abdomen: +BS x 4 quadrants, soft, non-tender, no guarding, rigidity, or rebound. Skin: warm and dry no rash Neuro: A&Ox4, CN II-XII grossly intact  Assessment and Plan:   1. Acute URI -nasal saline -antihistamine -phenergan dextromethorphan syrup - azithromycin (ZITHROMAX Z-PAK) 250 MG tablet; 2 po day one, then 1 daily x 4 days  Dispense: 6 tablet; Refill: 0 - predniSONE (DELTASONE) 20 MG tablet; 3 tabs po daily x 3 days, then 2 tabs x 3 days, then 1.5 tabs x 3 days, then 1 tab x 3 days, then 0.5 tabs x 3 days  Dispense: 27  tablet; Refill: 0 - fluticasone (FLONASE) 50 MCG/ACT nasal spray; Place 2 sprays into both nostrils daily.  Dispense: 16 g; Refill: 0

## 2015-12-22 ENCOUNTER — Ambulatory Visit (HOSPITAL_COMMUNITY)
Admission: RE | Admit: 2015-12-22 | Discharge: 2015-12-22 | Disposition: A | Discharge status: Home / Self Care | Payer: Medicare Other | Source: Ambulatory Visit | Attending: Physician Assistant | Admitting: Physician Assistant

## 2015-12-22 ENCOUNTER — Ambulatory Visit (INDEPENDENT_AMBULATORY_CARE_PROVIDER_SITE_OTHER): Payer: Medicare Other | Admitting: Physician Assistant

## 2015-12-22 ENCOUNTER — Encounter: Payer: Self-pay | Admitting: Physician Assistant

## 2015-12-22 VITALS — BP 122/84 | HR 80 | Temp 97.7°F | Resp 16 | Ht 71.0 in | Wt 209.0 lb

## 2015-12-22 DIAGNOSIS — R6889 Other general symptoms and signs: Secondary | ICD-10-CM | POA: Diagnosis not present

## 2015-12-22 DIAGNOSIS — J3489 Other specified disorders of nose and nasal sinuses: Secondary | ICD-10-CM | POA: Diagnosis not present

## 2015-12-22 DIAGNOSIS — J069 Acute upper respiratory infection, unspecified: Secondary | ICD-10-CM

## 2015-12-22 DIAGNOSIS — R062 Wheezing: Secondary | ICD-10-CM | POA: Diagnosis not present

## 2015-12-22 DIAGNOSIS — R05 Cough: Secondary | ICD-10-CM | POA: Diagnosis not present

## 2015-12-22 DIAGNOSIS — E119 Type 2 diabetes mellitus without complications: Secondary | ICD-10-CM

## 2015-12-22 DIAGNOSIS — I10 Essential (primary) hypertension: Secondary | ICD-10-CM

## 2015-12-22 DIAGNOSIS — N400 Enlarged prostate without lower urinary tract symptoms: Secondary | ICD-10-CM | POA: Diagnosis not present

## 2015-12-22 DIAGNOSIS — Z0001 Encounter for general adult medical examination with abnormal findings: Secondary | ICD-10-CM | POA: Diagnosis not present

## 2015-12-22 DIAGNOSIS — E7800 Pure hypercholesterolemia, unspecified: Secondary | ICD-10-CM

## 2015-12-22 DIAGNOSIS — Z0000 Encounter for general adult medical examination without abnormal findings: Secondary | ICD-10-CM

## 2015-12-22 DIAGNOSIS — K219 Gastro-esophageal reflux disease without esophagitis: Secondary | ICD-10-CM

## 2015-12-22 DIAGNOSIS — J340 Abscess, furuncle and carbuncle of nose: Secondary | ICD-10-CM

## 2015-12-22 DIAGNOSIS — R569 Unspecified convulsions: Secondary | ICD-10-CM | POA: Diagnosis not present

## 2015-12-22 LAB — BASIC METABOLIC PANEL WITH GFR
BUN: 16 mg/dL (ref 7–25)
CALCIUM: 9.7 mg/dL (ref 8.6–10.3)
CHLORIDE: 100 mmol/L (ref 98–110)
CO2: 27 mmol/L (ref 20–31)
Creat: 1.09 mg/dL (ref 0.70–1.25)
GFR, EST NON AFRICAN AMERICAN: 69 mL/min (ref >=60)
GFR, Est African American: 80 mL/min (ref >=60)
Glucose, Bld: 114 mg/dL — ABNORMAL HIGH (ref 65–99)
Potassium: 4.7 mmol/L (ref 3.5–5.3)
SODIUM: 137 mmol/L (ref 135–146)

## 2015-12-22 LAB — CBC WITH DIFFERENTIAL/PLATELET
BASOS ABS: 0 10*3/uL (ref 0.0–0.1)
Basophils Relative: 0 % (ref 0–1)
Eosinophils Absolute: 0.3 10*3/uL (ref 0.0–0.7)
Eosinophils Relative: 3 % (ref 0–5)
HCT: 44.9 % (ref 39.0–52.0)
Hemoglobin: 14.8 g/dL (ref 13.0–17.0)
Lymphocytes Relative: 40 % (ref 12–46)
Lymphs Abs: 3.5 10*3/uL (ref 0.7–4.0)
MCH: 29.4 pg (ref 26.0–34.0)
MCHC: 33 g/dL (ref 30.0–36.0)
MCV: 89.3 fL (ref 78.0–100.0)
MPV: 11.8 fL (ref 8.6–12.4)
Monocytes Absolute: 1 10*3/uL (ref 0.1–1.0)
Monocytes Relative: 12 % (ref 3–12)
Neutro Abs: 3.9 10*3/uL (ref 1.7–7.7)
Neutrophils Relative %: 45 % (ref 43–77)
Platelets: 257 10*3/uL (ref 150–400)
RBC: 5.03 MIL/uL (ref 4.22–5.81)
RDW: 14.4 % (ref 11.5–15.5)
WBC: 8.7 10*3/uL (ref 4.0–10.5)

## 2015-12-22 LAB — HEPATIC FUNCTION PANEL
ALT: 70 U/L — ABNORMAL HIGH (ref 9–46)
AST: 39 U/L — AB (ref 10–35)
Albumin: 4 g/dL (ref 3.6–5.1)
Alkaline Phosphatase: 37 U/L — ABNORMAL LOW (ref 40–115)
BILIRUBIN DIRECT: 0.1 mg/dL (ref <=0.2)
BILIRUBIN TOTAL: 0.6 mg/dL (ref 0.2–1.2)
Indirect Bilirubin: 0.5 mg/dL (ref 0.2–1.2)
Total Protein: 6.6 g/dL (ref 6.1–8.1)

## 2015-12-22 LAB — LIPID PANEL
CHOL/HDL RATIO: 4.3 ratio (ref <=5.0)
CHOLESTEROL: 184 mg/dL (ref 125–200)
HDL: 43 mg/dL (ref >=40)
LDL Cholesterol: 109 mg/dL (ref <130)
Triglycerides: 162 mg/dL — ABNORMAL HIGH (ref <150)
VLDL: 32 mg/dL — AB (ref <30)

## 2015-12-22 LAB — TSH: TSH: 1.377 u[IU]/mL (ref 0.350–4.500)

## 2015-12-22 MED ORDER — LEVOFLOXACIN 500 MG PO TABS: 500.0000 mg | ORAL_TABLET | Freq: Every day | ORAL | Status: DC

## 2015-12-22 MED ORDER — IPRATROPIUM-ALBUTEROL 0.5-2.5 (3) MG/3ML IN SOLN
3.0000 mL | Freq: Once | RESPIRATORY_TRACT | Status: AC
  Administered 2015-12-22: 3 mL via RESPIRATORY_TRACT

## 2015-12-22 MED ORDER — PREDNISONE 20 MG PO TABS: ORAL_TABLET | ORAL | Status: DC

## 2015-12-22 MED ORDER — ALBUTEROL SULFATE HFA 108 (90 BASE) MCG/ACT IN AERS: 2.0000 | INHALATION_SPRAY | RESPIRATORY_TRACT | Status: DC | PRN

## 2015-12-22 NOTE — Progress Notes (Signed)
MEDICARE ANNUAL WELLNESS VISIT AND FOLLOW UP Assessment:   1. Essential hypertension - continue medications, DASH diet, exercise and monitor at home. Call if greater than 130/80. - CBC with Differential/Platelet - BASIC METABOLIC PANEL WITH GFR - Hepatic function panel - TSH  2. Gastroesophageal reflux disease, esophagitis presence not specified Continue PPI/H2 blocker, diet discussed  3. Diabetes mellitus without complication [facility]) Discussed general issues about diabetes pathophysiology and management., Educational material distributed., Suggested low cholesterol diet., Encouraged aerobic exercise., Discussed foot care., Reminded to get yearly retinal exam. - Hemoglobin A1c  4. BPH (benign prostatic hyperplasia) controlled  5. Seizures ([place]) Continue medications, neuro follow up  6. Hypercholesterolemia -continue medications, check lipids, decrease fatty foods, increase activity.  - Lipid panel  7. Encounter for Medicare annual wellness exam Up to date at the [place] with shots and colonoscopy, will attempt to get records  8. Morbid obesity, unspecified obesity type ([place]) Obesity with co morbidities- long discussion about weight loss, diet, and exercise  9. URI (upper respiratory infection) Get CXR - PR ALBUTEROL IPRATROP NON-COMP - levofloxacin (LEVAQUIN) 500 MG tablet; Take 1 tablet (500 mg total) by mouth daily.  Dispense: 10 tablet; Refill: 0 - predniSONE (DELTASONE) 20 MG tablet; 2 tablets daily for 3 days, 1 tablet daily for 4 days.  Dispense: 10 tablet; Refill: 0 - albuterol (VENTOLIN HFA) 108 (90 Base) MCG/ACT inhaler; Inhale 2 puffs into the lungs every 4 (four) hours as needed for wheezing or shortness of breath.  Dispense: 1 Inhaler; Refill: 0 - ipratropium-albuterol (DUONEB) 0.5-2.5 (3) MG/3ML nebulizer solution 3 mL; Take 3 mLs by nebulization once. - DG Chest 2 View; Future  10. Ulcer of nose Follow up at [facility], rule out SCC  Over 30 minutes of exam, counseling,  chart review, and critical decision making was performed  Plan:   During the course of the visit the patient was educated and counseled about appropriate screening and preventive services including:    Pneumococcal vaccine   Influenza vaccine  Prevnar 13  Td vaccine  Screening electrocardiogram  Colorectal cancer screening  Diabetes screening  Glaucoma screening  Nutrition counseling   Conditions/risks identified: BMI: Discussed weight loss, diet, and increase physical activity.  Increase physical activity: AHA recommends 150 minutes of physical activity a week.  Medications reviewed Diabetes is at goal, ACE/ARB therapy: Yes. Urinary Incontinence is not an issue: discussed non pharmacology and pharmacology options.  Fall risk: low- discussed PT, home fall assessment, medications.    Subjective:  Trevor [name] is a 70 y.o. male who presents for Medicare Annual Wellness Visit and 3 month follow up for HTN, hyperlipidemia, DM, and vitamin D Def.  Date of last medicare wellness visit was is unknown.  He follows with the [facility] hospital but has not had labs done here for a long time.  His blood pressure has been controlled at home, today their BP is BP: 122/84 mmHg He does not workout. He denies chest pain.  He has COPD, he was treated zpak, prednisone and flonase on 12/05/2015, states he was feeling better until 1 week ago he started to have cough, wheezing, SOB. He is on cholesterol medication and denies myalgias.   He has been working on diet and exercise for diabetes, and denies paresthesia of the feet, polydipsia, polyuria and visual disturbances. Last A1C in the office was checked at the [place]. Last GFR NonAA   Lab Results  Component Value Date   GFRNONAA 56* 06/13/2015   Patient is on Vitamin  D supplement.      Medication Review: Current Outpatient Prescriptions on File Prior to Visit  Medication Sig Dispense Refill  . aspirin 81 MG chewable tablet Chew 81 mg by  mouth daily.    Marland Kitchen atenolol (TENORMIN) 100 MG tablet Take 100 mg by mouth daily.    . divalproex (DEPAKOTE ER) 500 MG 24 hr tablet Take 500 mg by mouth every evening.    . finasteride (PROSCAR) 5 MG tablet Take 5 mg by mouth every evening.    . fluticasone (FLONASE) 50 MCG/ACT nasal spray Place 2 sprays into both nostrils daily. 16 g 0  . glipiZIDE (GLUCOTROL) 5 MG tablet Take 2.5 mg by mouth every evening.    Marland Kitchen lisinopril (PRINIVIL,ZESTRIL) 40 MG tablet Take 40 mg by mouth daily.    . metFORMIN (GLUCOPHAGE) 1000 MG tablet Take 1,000 mg by mouth 2 (two) times daily with a meal.    . Multiple Vitamin (MULTIVITAMIN) tablet Take 1 tablet by mouth daily.    . Multiple Vitamins-Minerals (ICAPS AREDS FORMULA PO) Take 1 tablet by mouth daily.    . pravastatin (PRAVACHOL) 80 MG tablet Take 80 mg by mouth every evening.     No current facility-administered medications on file prior to visit.    Current Problems (verified) Patient Active Problem List   Diagnosis Date Noted  . Hypercholesterolemia   . Hypertension   . Diabetes mellitus without complication ([place])   . Seizures ([place])   . BPH (benign prostatic hyperplasia)   . GERD (gastroesophageal reflux disease)     Screening Tests Immunization History  Administered Date(s) Administered  . Td 03/24/2002   Preventative care: Last colonoscopy: 2003 Dr. Deatra Ina CXR 05/2015  Prior vaccinations: TD or Tdap: 2013 at [place]  Influenza: declines Pneumococcal: declines Prevnar13: declines Shingles/Zostavax: declines  Names of Other Physician/Practitioners you currently use: 1. [facility] here for primary care 2. Readers,  eye doctor at the [facility] hospital, last visit 9 months ago 3. NONE, dentures dentist Patient Care Team: Unk Pinto, MD as PCP - General (Internal Medicine) Inda Castle, MD as Consulting Physician (Gastroenterology) Newt Minion, MD as Consulting Physician (Orthopedic  Surgery) Garvin Fila, MD as Consulting Physician (Neurology)  Past Surgical History  Procedure Laterality Date  . Knee arthroscopy Left 1998  . Hand surgery Left 1953   Family History  Problem Relation Age of Onset  . CVA Mother   . Thyroid disease Mother   . Heart disease Father   . Diabetes Sister   . Heart disease Sister   . Diabetes Brother   . Heart disease Brother   . Epilepsy Brother   . Schizophrenia Brother    Social History  Substance Use Topics  . Smoking status: Former Smoker    Quit date: 12/17/2013  . Smokeless tobacco: None  . Alcohol Use: No    MEDICARE WELLNESS OBJECTIVES: Tobacco use: He does not smoke.  Patient is a former smoker. If yes, counseling given Alcohol Current alcohol use: none Osteoporosis: dietary calcium and/or vitamin D deficiency, History of fracture in the past year: no Fall risk: Low Risk Diet: in general, a "healthy" diet   Physical activity: Current Exercise Habits:: The patient does not participate in regular exercise at present Cardiac risk factors: Cardiac Risk Factors include: advanced age (>45men, >32 women);diabetes mellitus;dyslipidemia;hypertension;male gender;obesity (BMI >30kg/m2);sedentary lifestyle Depression/mood screen:   Depression screen [facility] 2/9 12/22/2015  Decreased Interest 0  Down, Depressed, Hopeless 0  PHQ -  2 Score 0    ADLs:  In your present state of health, do you have any difficulty performing the following activities: 12/22/2015  Hearing? Y  Vision? Y  Difficulty concentrating or making decisions? N  Walking or climbing stairs? Y  Dressing or bathing? N  Doing errands, shopping? N  Preparing Food and eating ? N  Using the Toilet? N  In the past six months, have you accidently leaked urine? N  Do you have problems with loss of bowel control? N  Managing your Medications? N  Managing your Finances? N  Housekeeping or managing your Housekeeping? N     Cognitive Testing  Alert? Yes  Normal  Appearance?Yes  Oriented to person? Yes  Place? Yes   Time? Yes  Recall of three objects?  Yes  Can perform simple calculations? Yes  Displays appropriate judgment?Yes  Can read the correct time from a watch face?Yes  EOL planning: Does patient have an advance directive?: No Would patient like information on creating an advanced directive?: Yes - Educational materials given   Objective:   Today's Vitals   12/22/15 1423  BP: 122/84  Pulse: 80  Temp: 97.7 F (36.5 C)  TempSrc: Temporal  Resp: 16  Height: 5\' 11"  (1.803 m)  Weight: 209 lb (94.802 kg)  SpO2: 95%   Body mass index is 29.16 kg/(m^2).  General appearance: alert, no distress, WD/WN, male HEENT: normocephalic, sclerae anicteric, TMs pearly, nares patent, no discharge or erythema, pharynx normal, decrease hearing Oral cavity: MMM, no lesions, dentures Neck: supple, no lymphadenopathy, no thyromegaly, no masses Heart: RRR, normal S1, S2, no murmurs Lungs: CTA bilaterally, decreased breath sounds, wheezing RLL, no rhonchi, or rales Abdomen: +bs, soft, obese, non tender, non distended, no masses, no hepatomegaly, no splenomegaly Musculoskeletal: nontender, no swelling, no obvious deformity Extremities: no edema, no cyanosis, no clubbing Pulses: 2+ symmetric, upper and lower extremities, normal cap refill Neurological: alert, oriented x 3, CN2-12 intact, strength normal upper extremities and lower extremities, sensation normal throughout, DTRs 2+ throughout, no cerebellar signs, gait normal Skin: Has nonhealing ulcer right nose Psychiatric: normal affect, behavior normal, pleasant   Medicare Attestation I have personally reviewed: The patient's medical and social history Their use of alcohol, tobacco or illicit drugs Their current medications and supplements The patient's functional ability including ADLs,fall risks, home safety risks, cognitive, and hearing and visual impairment Diet and physical  activities Evidence for depression or mood disorders  The patient's weight, height, BMI, and visual acuity have been recorded in the chart.  I have made referrals, counseling, and provided education to the patient based on review of the above and I have provided the patient with a written personalized care plan for preventive services.     Vicie Mutters, PA-C   12/22/2015

## 2015-12-22 NOTE — Patient Instructions (Signed)
[facility] on your [facility]  I will give you a prescription for an antibiotic, but please only take it if you are not feeling better in 7-10 days.  Bronchitis is mostly caused by viruses and the antibiotic will do nothing.  PLEASE TRY TO DO OVER THE COUNTER TREATMENT AND/OR PREDNISONE FOR 5-7 DAYS AND IF YOU ARE NOT GETTING BETTER OR GETTING WORSE THEN YOU CAN START ON AN ANTIBIOTIC GIVEN.  Can take the prednisone AT NIGHT WITH DINNER, it take 8-12 hours to start working so it will NOT affect your sleeping if you take it at night with your food!! Take two pills the first night and 1 or two pill the second night and then 1 pill the other nights.    Rest and stay hydrated.  Make sure you drink plenty of fluids to make sure urine is clear when you urinate.  Water will help thin out mucous. - Take Mucinex DM- Maximum Strength over the counter to thin out and cough up the thick mucous.  Please follow directions on box. -Take Albuterol if prescribed.  Risk of antibiotic use: About 1 in 4 people who take antibiotics have side effects including stomach problems, dizziness, or rashes. Those problems clear up soon after stopping the drugs, but in rare cases antibiotics can cause severe allergic reaction. Over use of antibiotics also encourages the growth of bacteria that can't be controlled easily with drugs. That makes you more vunerable to antibiotic-resistant infections and undermines the benefits of antibiotics for others.   Waste of Money: Antibiotics often aren't very expensive, but any money spent on unnecessary drugs is money down the drain.   When are antibiotics needed? Only when symptoms last longer than a week.  Start to improve but then worsen again  Please call the office or message through My Chart if you have any questions.   Acute Bronchitis Bronchitis is when the airways that extend from the windpipe into the lungs get red, puffy, and painful  (inflamed). Bronchitis often causes thick spit (mucus) to develop. This leads to a cough. A cough is the most common symptom of bronchitis. In acute bronchitis, the condition usually begins suddenly and goes away over time (usually in 2 weeks). Smoking, allergies, and asthma can make bronchitis worse. Repeated episodes of bronchitis may cause more lung problems.  Most common cause of Bronchitis is viruses (rhinovirus, coronavirus, RSV).  Therefore, not requiring an antibiotic; as antibiotics only treat bacterial infections.  HOME CARE  Rest.  Drink enough fluids to keep your pee (urine) clear or pale yellow (unless you need to limit fluids as told by your doctor).  Only take over-the-counter or prescription medicines as told by your doctor.  Avoid smoking and secondhand smoke. These can make bronchitis worse. If you are a smoker, think about using nicotine gum or skin patches. Quitting smoking will help your lungs heal faster.  Reduce the chance of getting bronchitis again by:  Washing your hands often.  Avoiding people with cold symptoms.  Trying not to touch your hands to your mouth, nose, or eyes.  Follow up with your doctor as told. GET HELP IF: Your symptoms do not improve after 1 week of treatment. Symptoms include:  Cough.  Fever.  Coughing up thick spit.  Body aches.  Chest congestion.  Chills.  Shortness of breath.  Sore throat. GET HELP RIGHT AWAY IF:   You have an increased fever.  You have chills.  You  have severe shortness of breath.  You have bloody thick spit (sputum).  You throw up (vomit) often.  You lose too much body fluid (dehydration).  You have a severe headache.  You faint. MAKE SURE YOU:   Understand these instructions.  Will watch your condition.  Will get help right away if you are not doing well or get worse. Document Released: 05/21/2008 Document Revised: 08/05/2013 Document Reviewed: 05/26/2013 [facility] Patient  Information 2015 [place], [place]. This information is not intended to replace advice given to you by your health care provider. Make sure you discuss any questions you have with your health care provider.

## 2015-12-23 LAB — HEMOGLOBIN A1C
Hgb A1c MFr Bld: 8.5 % — ABNORMAL HIGH (ref <5.7)
Mean Plasma Glucose: 197 mg/dL — ABNORMAL HIGH (ref <117)

## 2017-02-06 ENCOUNTER — Ambulatory Visit (INDEPENDENT_AMBULATORY_CARE_PROVIDER_SITE_OTHER): Payer: Medicare Other | Admitting: Internal Medicine

## 2017-02-06 ENCOUNTER — Encounter: Payer: Self-pay | Admitting: Internal Medicine

## 2017-02-06 VITALS — BP 136/70 | HR 67 | Temp 97.7°F | Resp 16 | Ht 71.0 in | Wt 207.6 lb

## 2017-02-06 DIAGNOSIS — J441 Chronic obstructive pulmonary disease with (acute) exacerbation: Secondary | ICD-10-CM

## 2017-02-06 MED ORDER — PREDNISONE 20 MG PO TABS: ORAL_TABLET | ORAL | 0 refills | Status: DC

## 2017-02-06 MED ORDER — AZITHROMYCIN 250 MG PO TABS: ORAL_TABLET | ORAL | 0 refills | Status: DC

## 2017-02-06 MED ORDER — PROMETHAZINE-DM 6.25-15 MG/5ML PO SYRP: ORAL_SOLUTION | ORAL | 1 refills | Status: DC

## 2017-02-06 MED ORDER — MONTELUKAST SODIUM 10 MG PO TABS: 10.0000 mg | ORAL_TABLET | Freq: Every day | ORAL | 2 refills | Status: DC

## 2017-02-06 NOTE — Patient Instructions (Addendum)
Please use the trelegy sample inhaler once daily to help with your wheezing.  Please use your albuterol inhaler (ventolin) every 6-8 hours as you need to for shortness of breath.  Please take the prednisone until it is completely gone.  Please use phenergan dm as needed for coughing.  Please use flonase 2 sprays per nostril at bedtime until you congestion completely resolves.  Please take claritin, zyrtec, or allegra once daily to help dry up congestion.  Please take singulair daily to help with your breathing.    Please take zpak until gone completely.

## 2017-02-06 NOTE — Progress Notes (Signed)
HPI  Patient presents to the office for evaluation of cough.  It has been going on for 4 days.  Patient reports night > day, dry, barky, worse with lying down.  They also endorse change in voice, postnasal drip, shortness of breath, sputum production, wheezing and nasal congestion, clear rhinorrhea, headaches.  .  They have tried dayquil and nyquil, alkaseltzer plus, albuterol.  They report that nothing has worked.  They admits to other sick contacts.  He reports that he does use the albuterol but he is not sure whether it is helping much.    Review of Systems  Constitutional: Positive for malaise/fatigue. Negative for chills and fever.  HENT: Positive for congestion, ear pain, hearing loss and sore throat.   Respiratory: Positive for cough. Negative for sputum production, shortness of breath and wheezing.   Cardiovascular: Negative for chest pain, palpitations and leg swelling.  Neurological: Positive for headaches.    PE:  Vitals:   02/06/17 1405  BP: 136/70  Pulse: 67  Resp: 16  Temp: 97.7 F (36.5 C)    General:  Alert and non-toxic, WDWN, NAD HEENT: NCAT, PERLA, EOM normal, no occular discharge or erythema.  Nasal mucosal edema with sinus tenderness to palpation.  Oropharynx clear with minimal oropharyngeal edema and erythema.  Mucous membranes moist and pink. Neck:  Cervical adenopathy Chest:  RRR no MRGs.  Lungs clear to auscultation A&P with no wheezes rhonchi or rales.   Abdomen: +BS x 4 quadrants, soft, non-tender, no guarding, rigidity, or rebound. Skin: warm and dry no rash Neuro: A&Ox4, CN II-XII grossly intact  Assessment and Plan:   1. COPD exacerbation ([facility]CC) -trelegy sample 100 mg QD x 14 -albuterol every 6 hours prn -zpak -prednisone -phenergan dm -singulair -daily antihistamine -flonase

## 2017-04-04 ENCOUNTER — Ambulatory Visit (HOSPITAL_COMMUNITY)
Admission: EM | Admit: 2017-04-04 | Discharge: 2017-04-04 | Disposition: A | Discharge status: Home / Self Care | Payer: Medicare Other | Attending: Family Medicine | Admitting: Family Medicine

## 2017-04-04 ENCOUNTER — Encounter (HOSPITAL_COMMUNITY): Payer: Self-pay | Admitting: Emergency Medicine

## 2017-04-04 DIAGNOSIS — R0602 Shortness of breath: Secondary | ICD-10-CM | POA: Diagnosis not present

## 2017-04-04 DIAGNOSIS — J441 Chronic obstructive pulmonary disease with (acute) exacerbation: Secondary | ICD-10-CM

## 2017-04-04 DIAGNOSIS — R062 Wheezing: Secondary | ICD-10-CM | POA: Diagnosis not present

## 2017-04-04 MED ORDER — IPRATROPIUM-ALBUTEROL 0.5-2.5 (3) MG/3ML IN SOLN
3.0000 mL | Freq: Once | RESPIRATORY_TRACT | Status: AC
  Administered 2017-04-04: 3 mL via RESPIRATORY_TRACT

## 2017-04-04 MED ORDER — ALBUTEROL SULFATE (2.5 MG/3ML) 0.083% IN NEBU
INHALATION_SOLUTION | RESPIRATORY_TRACT | Status: AC
  Filled 2017-04-04: qty 3

## 2017-04-04 MED ORDER — IPRATROPIUM-ALBUTEROL 0.5-2.5 (3) MG/3ML IN SOLN
RESPIRATORY_TRACT | Status: AC
  Filled 2017-04-04: qty 3

## 2017-04-04 MED ORDER — LEVOFLOXACIN 500 MG PO TABS: 500.0000 mg | ORAL_TABLET | Freq: Every day | ORAL | 0 refills | Status: DC

## 2017-04-04 MED ORDER — ALBUTEROL SULFATE (2.5 MG/3ML) 0.083% IN NEBU
2.5000 mg | INHALATION_SOLUTION | Freq: Once | RESPIRATORY_TRACT | Status: AC
  Administered 2017-04-04: 2.5 mg via RESPIRATORY_TRACT

## 2017-04-04 MED ORDER — BENZONATATE 100 MG PO CAPS: 200.0000 mg | ORAL_CAPSULE | Freq: Three times a day (TID) | ORAL | 0 refills | Status: DC | PRN

## 2017-04-04 MED ORDER — METHYLPREDNISOLONE SODIUM SUCC 125 MG IJ SOLR
125.0000 mg | Freq: Once | INTRAMUSCULAR | Status: AC
  Administered 2017-04-04: 125 mg via INTRAMUSCULAR

## 2017-04-04 MED ORDER — METHYLPREDNISOLONE SODIUM SUCC 125 MG IJ SOLR
INTRAMUSCULAR | Status: AC
  Filled 2017-04-04: qty 2

## 2017-04-04 MED ORDER — PREDNISONE 10 MG PO TABS: ORAL_TABLET | ORAL | 0 refills | Status: DC

## 2017-04-04 MED ORDER — ALBUTEROL SULFATE (2.5 MG/3ML) 0.083% IN NEBU: 2.5000 mg | INHALATION_SOLUTION | Freq: Once | RESPIRATORY_TRACT | Status: DC

## 2017-04-04 NOTE — ED Triage Notes (Signed)
Pt c/o wheezing and SOB onset 2 weeks associated w/dry cough  Taking left over Hydromet and albuterol w/no relief  A&O x4... NAD... Wife at bedside.

## 2017-04-04 NOTE — ED Provider Notes (Signed)
CSN: [id number]     Arrival date & time 04/04/17  1612 History   None    Chief Complaint  Patient presents with  . Shortness of Breath   (Consider location/radiation/quality/duration/timing/severity/associated sxs/prior Treatment) Patient c/o sob and difficulty breathing.  He has hx of COPD and fifty pack year hx.  He quit smoking a couple years ago. He reports ongoing problems with breathing.  He sees the [place] and is not engaged with a pulmonologist.  He has been using his SABA MDI w/o relief.     The history is provided by the patient.  Shortness of Breath  Severity:  Severe Onset quality:  Sudden Duration:  1 week Timing:  Constant Progression:  Worsening Chronicity:  New Context: activity and pollens   Relieved by:  Nothing Worsened by:  Activity Ineffective treatments:  Inhaler Associated symptoms: wheezing     Past Medical History:  Diagnosis Date  . BPH (benign prostatic hyperplasia)   . COPD (chronic obstructive pulmonary disease) ([place])   . Diabetes mellitus without complication ([place])   . GERD (gastroesophageal reflux disease)   . Hypercholesterolemia   . Hypertension   . Seizures ([place])   . Type II or unspecified type diabetes mellitus without mention of complication, not stated as uncontrolled    Past Surgical History:  Procedure Laterality Date  . HAND SURGERY Left 1953  . KNEE ARTHROSCOPY Left 1998   Family History  Problem Relation Age of Onset  . CVA Mother   . Thyroid disease Mother   . Heart disease Father   . Diabetes Sister   . Heart disease Sister   . Diabetes Brother   . Heart disease Brother   . Epilepsy Brother   . Schizophrenia Brother    Social History  Substance Use Topics  . Smoking status: Former Smoker    Quit date: 12/17/2013  . Smokeless tobacco: Never Used  . Alcohol use No    Review of Systems  Constitutional: Negative.   HENT: Negative.   Eyes: Negative.   Respiratory: Positive for shortness of breath and wheezing.    Gastrointestinal: Negative.   Endocrine: Negative.   Genitourinary: Negative.   Musculoskeletal: Negative.   Allergic/Immunologic: Negative.   Neurological: Negative.   Hematological: Negative.   Psychiatric/Behavioral: Negative.     Allergies  Penicillins and Latex  Home Medications   Prior to Admission medications   Medication Sig Start Date End Date Taking? Authorizing Provider  albuterol (VENTOLIN HFA) 108 (90 Base) MCG/ACT inhaler Inhale 2 puffs into the lungs every 4 (four) hours as needed for wheezing or shortness of breath. 12/22/15  Yes Vicie Mutters, PA-C  aspirin 81 MG chewable tablet Chew 81 mg by mouth daily.   Yes Historical Provider, MD  atenolol (TENORMIN) 100 MG tablet Take 100 mg by mouth daily.   Yes Historical Provider, MD  divalproex (DEPAKOTE ER) 500 MG 24 hr tablet Take 500 mg by mouth every evening.   Yes Historical Provider, MD  finasteride (PROSCAR) 5 MG tablet Take 5 mg by mouth every evening.   Yes Historical Provider, MD  fluticasone (FLONASE) 50 MCG/ACT nasal spray Place 2 sprays into both nostrils daily. 12/05/15  Yes Courtney Forcucci, PA-C  glipiZIDE (GLUCOTROL) 5 MG tablet Take 2.5 mg by mouth every evening.   Yes Historical Provider, MD  lisinopril (PRINIVIL,ZESTRIL) 40 MG tablet Take 40 mg by mouth daily.   Yes Historical Provider, MD  metFORMIN (GLUCOPHAGE) 1000 MG tablet Take 1,000 mg by mouth 2 (two) times  daily with a meal.   Yes Historical Provider, MD  montelukast (SINGULAIR) 10 MG tablet Take 1 tablet (10 mg total) by mouth daily. 02/06/17 02/06/18 Yes Courtney Forcucci, PA-C  Multiple Vitamin (MULTIVITAMIN) tablet Take 1 tablet by mouth daily.   Yes Historical Provider, MD  Multiple Vitamins-Minerals (ICAPS AREDS FORMULA PO) Take 1 tablet by mouth daily.   Yes Historical Provider, MD  pravastatin (PRAVACHOL) 80 MG tablet Take 80 mg by mouth every evening.   Yes Historical Provider, MD  azithromycin (ZITHROMAX Z-PAK) 250 MG tablet 2 po day one,  then 1 daily x 4 days 02/06/17   Loma Sousa Forcucci, PA-C  benzonatate (TESSALON) 100 MG capsule Take 2 capsules (200 mg total) by mouth 3 (three) times daily as needed for cough. 04/04/17   Lysbeth Penner, FNP  levofloxacin (LEVAQUIN) 500 MG tablet Take 1 tablet (500 mg total) by mouth daily. 04/04/17   Lysbeth Penner, FNP  predniSONE (DELTASONE) 10 MG tablet Take 4 po qd x 3d then 3 po qd x 3 days then 2 po qd x 3days then 1 po qd x 3 days then stop 04/04/17   Lysbeth Penner, FNP  predniSONE (DELTASONE) 20 MG tablet 3 tabs po daily x 3 days, then 2 tabs x 3 days, then 1.5 tabs x 3 days, then 1 tab x 3 days, then 0.5 tabs x 3 days 02/06/17   Starlyn Skeans, PA-C  promethazine-dextromethorphan (PROMETHAZINE-DM) 6.25-15 MG/5ML syrup Take 5-10 ML PO q8hrs prn for coughing 02/06/17   Starlyn Skeans, PA-C   Meds Ordered and Administered this Visit   Medications  ipratropium-albuterol (DUONEB) 0.5-2.5 (3) MG/3ML nebulizer solution 3 mL (3 mLs Nebulization Given 04/04/17 1713)  albuterol (PROVENTIL) (2.5 MG/3ML) 0.083% nebulizer solution 2.5 mg (2.5 mg Nebulization Given 04/04/17 1713)  methylPREDNISolone sodium succinate (SOLU-MEDROL) 125 mg/2 mL injection 125 mg (125 mg Intramuscular Given 04/04/17 1735)    BP (!) 180/78 (BP Location: Left Arm)   Pulse 69   Temp 98.2 F (36.8 C) (Oral)   Resp (!) 24   SpO2 91%  No data found.   Physical Exam  Constitutional: He is oriented to person, place, and time. He appears well-developed and well-nourished.  HENT:  Head: Normocephalic and atraumatic.  Right Ear: External ear normal.  Left Ear: External ear normal.  Mouth/Throat: Oropharynx is clear and moist.  Eyes: Conjunctivae and EOM are normal. Pupils are equal, round, and reactive to light.  Neck: Normal range of motion. Neck supple.  Cardiovascular: Normal rate, regular rhythm and normal heart sounds.   Pulmonary/Chest: He is in respiratory distress. He has wheezes.  BBS diminished and  poor air movement and patient tachypniec and using upper accessory muscles.  Oxygen saturation 91%  Abdominal: Soft.  Neurological: He is alert and oriented to person, place, and time.  Nursing note and vitals reviewed.   Urgent Care Course     Procedures (including critical care time)  Labs Review Labs Reviewed - No data to display  Imaging Review No results found.   Visual Acuity Review  Right Eye Distance:   Left Eye Distance:   Bilateral Distance:    Right Eye Near:   Left Eye Near:    Bilateral Near:         MDM   1. COPD exacerbation ([place])   2. SOB (shortness of breath)    Neb tx with duo neb and albuterol neb with good results Solumedrol 125mg  IM Prednisone 10mg  4x3,3x3,2x3,1x3 then stop #30 Levaquin  500mg  one po qd x 7 days  Referral to Dr. [facility]      Lysbeth Penner, FNP 04/04/17 [phone]

## 2017-04-17 ENCOUNTER — Telehealth: Payer: Self-pay | Admitting: *Deleted

## 2017-04-17 NOTE — Telephone Encounter (Signed)
Please schedule pt at [facility] office. Dr. Ashby Dawes received message to schedule an appt with pulmonary due to ER visit but pt lives in [place] and not [place]. He has never been seen by [facility]. Thanks.

## 2017-04-17 NOTE — Telephone Encounter (Signed)
[place], [place] - 04/04/17 << Less Detail',event)" href="javascript:;">More Detail >>    Laverle Hobby, MD  Sent: Tue Apr 16, 2017 8:16 AM  To: Renelda Mom, LPN            Message   Pt has been referred to [place] for COPD by ED. Please ensure they make an appointment.   ----- Message -----  From: Robyn Haber, MD  Sent: 04/04/2017  9:47 PM  To: Laverle Hobby, MD          ED Referral Summary  Trevor, [name] QNV#[id number] ([id number] CSN# [id number]) (DOB:1946-10-02 71 y.o. at discharge M)  PCP: Essie Christine [phone])  [place] yesterday and today asking pt to return call to be scheduled for an appt with our office. Will await call back.

## 2017-07-08 ENCOUNTER — Ambulatory Visit (INDEPENDENT_AMBULATORY_CARE_PROVIDER_SITE_OTHER): Payer: Medicare Other | Admitting: Physician Assistant

## 2017-07-08 ENCOUNTER — Other Ambulatory Visit: Payer: Self-pay

## 2017-07-08 ENCOUNTER — Encounter: Payer: Self-pay | Admitting: Physician Assistant

## 2017-07-08 VITALS — BP 124/86 | HR 67 | Temp 97.6°F | Resp 16 | Ht 71.0 in | Wt 196.4 lb

## 2017-07-08 DIAGNOSIS — E0822 Diabetes mellitus due to underlying condition with diabetic chronic kidney disease: Secondary | ICD-10-CM | POA: Diagnosis not present

## 2017-07-08 DIAGNOSIS — J449 Chronic obstructive pulmonary disease, unspecified: Secondary | ICD-10-CM

## 2017-07-08 DIAGNOSIS — R6889 Other general symptoms and signs: Secondary | ICD-10-CM | POA: Diagnosis not present

## 2017-07-08 DIAGNOSIS — N401 Enlarged prostate with lower urinary tract symptoms: Secondary | ICD-10-CM | POA: Diagnosis not present

## 2017-07-08 DIAGNOSIS — K219 Gastro-esophageal reflux disease without esophagitis: Secondary | ICD-10-CM

## 2017-07-08 DIAGNOSIS — Z0001 Encounter for general adult medical examination with abnormal findings: Secondary | ICD-10-CM

## 2017-07-08 DIAGNOSIS — J441 Chronic obstructive pulmonary disease with (acute) exacerbation: Secondary | ICD-10-CM | POA: Diagnosis not present

## 2017-07-08 DIAGNOSIS — R569 Unspecified convulsions: Secondary | ICD-10-CM

## 2017-07-08 DIAGNOSIS — N182 Chronic kidney disease, stage 2 (mild): Secondary | ICD-10-CM

## 2017-07-08 DIAGNOSIS — R350 Frequency of micturition: Secondary | ICD-10-CM | POA: Diagnosis not present

## 2017-07-08 DIAGNOSIS — I10 Essential (primary) hypertension: Secondary | ICD-10-CM

## 2017-07-08 DIAGNOSIS — E7800 Pure hypercholesterolemia, unspecified: Secondary | ICD-10-CM | POA: Diagnosis not present

## 2017-07-08 DIAGNOSIS — Z0000 Encounter for general adult medical examination without abnormal findings: Secondary | ICD-10-CM

## 2017-07-08 LAB — CBC WITH DIFFERENTIAL/PLATELET
BASOS ABS: 100 {cells}/uL (ref 0–200)
Basophils Relative: 1 %
Eosinophils Absolute: 1800 cells/uL — ABNORMAL HIGH (ref 15–500)
Eosinophils Relative: 18 %
HCT: 42.6 % (ref 38.5–50.0)
Hemoglobin: 14 g/dL (ref 13.2–17.1)
LYMPHS PCT: 39 %
Lymphs Abs: 3900 cells/uL (ref 850–3900)
MCH: 30.2 pg (ref 27.0–33.0)
MCHC: 32.9 g/dL (ref 32.0–36.0)
MCV: 92 fL (ref 80.0–100.0)
MONO ABS: 900 {cells}/uL (ref 200–950)
MPV: 11 fL (ref 7.5–12.5)
Monocytes Relative: 9 %
NEUTROS PCT: 33 %
Neutro Abs: 3300 cells/uL (ref 1500–7800)
Platelets: 282 10*3/uL (ref 140–400)
RBC: 4.63 MIL/uL (ref 4.20–5.80)
RDW: 13.8 % (ref 11.0–15.0)
WBC: 10 10*3/uL (ref 3.8–10.8)

## 2017-07-08 LAB — TSH: TSH: 2.79 m[IU]/L (ref 0.40–4.50)

## 2017-07-08 MED ORDER — AZITHROMYCIN 250 MG PO TABS: ORAL_TABLET | ORAL | 0 refills | Status: DC

## 2017-07-08 MED ORDER — PREDNISONE 10 MG PO TABS: ORAL_TABLET | ORAL | 0 refills | Status: DC

## 2017-07-08 MED ORDER — IPRATROPIUM-ALBUTEROL 0.5-2.5 (3) MG/3ML IN SOLN
3.0000 mL | Freq: Once | RESPIRATORY_TRACT | Status: AC
  Administered 2017-07-08: 3 mL via RESPIRATORY_TRACT

## 2017-07-08 NOTE — Patient Instructions (Addendum)
Stop afrin Can use generic flonase  Use breo samples for 2 weeks, do once a day in the morning before you wash out your mouth Need to wash out mouth or can get yeast.   HOW TO TREAT VIRAL COUGH AND COLD SYMPTOMS:  -Symptoms usually last at least 1 week with the worst symptoms being around day 4.  - colds usually start with a sore throat and end with a cough, and the cough can take 2 weeks to get better.  -No antibiotics are needed for colds, flu, sore throats, cough, bronchitis UNLESS symptoms are longer than 7 days OR if you are getting better then get drastically worse.  -There are a lot of combination medications (Dayquil, Nyquil, Vicks 44, tyelnol cold and sinus, ETC). Please look at the ingredients on the back so that you are treating the correct symptoms and not doubling up on medications/ingredients.    Medicines you can use  Nasal congestion  - pseudoephedrine (Sudafed)- behind the counter, do not use if you have high blood pressure, medicine that have -D in them.  - phenylephrine (Sudafed PE) -Dextormethorphan + chlorpheniramine (Coridcidin HBP)- okay if you have high blood pressure -Oxymetazoline (Afrin) nasal spray- LIMIT to 3 days -Saline nasal spray -Neti pot (used distilled or bottled water)  Ear pain/congestion  -pseudoephedrine (sudafed) - Nasonex/flonase nasal spray  Fever  -Acetaminophen (Tyelnol) -Ibuprofen (Advil, motrin, aleve)  Sore Throat  -Acetaminophen (Tyelnol) -Ibuprofen (Advil, motrin, aleve) -Drink a lot of water -Gargle with salt water - Rest your voice (don't talk) -Throat sprays -Cough drops  Body Aches  -Acetaminophen (Tyelnol) -Ibuprofen (Advil, motrin, aleve)  Headache  -Acetaminophen (Tyelnol) -Ibuprofen (Advil, motrin, aleve) - Exedrin, Exedrin Migraine  Allergy symptoms (cough, sneeze, runny nose, itchy eyes) -Claritin or loratadine cheapest but likely the weakest  -Zyrtec or certizine at night because it can make you  sleepy -The strongest is allegra or fexafinadine  Cheapest at [employer], [employer], [employer]  Cough  -Dextromethorphan (Delsym)- medicine that has DM in it -Guafenesin (Mucinex/Robitussin) - cough drops - drink lots of water  Chest Congestion  -Guafenesin (Mucinex/Robitussin)  Red Itchy Eyes  - Naphcon-A  Upset Stomach  - Bland diet (nothing spicy, greasy, fried, and high acid foods like tomatoes, oranges, berries) -OKAY- cereal, bread, soup, crackers, rice -Eat smaller more frequent meals -reduce caffeine, no alcohol -Loperamide (Imodium-AD) if diarrhea -Prevacid for heart burn  General health when sick  -Hydration -wash your hands frequently -keep surfaces clean -change pillow cases and sheets often -Get fresh air but do not exercise strenuously -Vitamin D, double up on it - Vitamin C -Zinc    Chronic Obstructive Pulmonary Disease Chronic obstructive pulmonary disease (COPD) is a common lung condition in which airflow from the lungs is limited. COPD is a general term that can be used to describe many different lung problems that limit airflow, including both chronic bronchitis and emphysema. If you have COPD, your lung function will probably never return to normal, but there are measures you can take to improve lung function and make yourself feel better. What are the causes?  Smoking (common).  Exposure to secondhand smoke.  Genetic problems.  Chronic inflammatory lung diseases or recurrent infections. What are the signs or symptoms?  Shortness of breath, especially with physical activity.  Deep, persistent (chronic) cough with a large amount of thick mucus.  Wheezing.  Rapid breaths (tachypnea).  Gray or bluish discoloration (cyanosis) of the skin, especially in your fingers, toes, or lips.  Fatigue.  Weight  loss.  Frequent infections or episodes when breathing symptoms become much worse (exacerbations).  Chest tightness. How is this diagnosed? Your  health care provider will take a medical history and perform a physical examination to diagnose COPD. Additional tests for COPD may include:  Lung (pulmonary) function tests.  Chest X-ray.  CT scan.  Blood tests.  How is this treated? Treatment for COPD may include:  Inhaler and nebulizer medicines. These help manage the symptoms of COPD and make your breathing more comfortable.  Supplemental oxygen. Supplemental oxygen is only helpful if you have a low oxygen level in your blood.  Exercise and physical activity. These are beneficial for nearly all people with COPD.  Lung surgery or transplant.  Nutrition therapy to gain weight, if you are underweight.  Pulmonary rehabilitation. This may involve working with a team of health care providers and specialists, such as respiratory, occupational, and physical therapists.  Follow these instructions at home:  Take all medicines (inhaled or pills) as directed by your health care provider.  Avoid over-the-counter medicines or cough syrups that dry up your airway (such as antihistamines) and slow down the elimination of secretions unless instructed otherwise by your health care provider.  If you are a smoker, the most important thing that you can do is stop smoking. Continuing to smoke will cause further lung damage and breathing trouble. Ask your health care provider for help with quitting smoking. He or she can direct you to community resources or hospitals that provide support.  Avoid exposure to irritants such as smoke, chemicals, and fumes that aggravate your breathing.  Use oxygen therapy and pulmonary rehabilitation if directed by your health care provider. If you require home oxygen therapy, ask your health care provider whether you should purchase a pulse oximeter to measure your oxygen level at home.  Avoid contact with individuals who have a contagious illness.  Avoid extreme temperature and humidity changes.  Eat healthy  foods. Eating smaller, more frequent meals and resting before meals may help you maintain your strength.  Stay active, but balance activity with periods of rest. Exercise and physical activity will help you maintain your ability to do things you want to do.  Preventing infection and hospitalization is very important when you have COPD. Make sure to receive all the vaccines your health care provider recommends, especially the pneumococcal and influenza vaccines. Ask your health care provider whether you need a pneumonia vaccine.  Learn and use relaxation techniques to manage stress.  Learn and use controlled breathing techniques as directed by your health care provider. Controlled breathing techniques include: 1. Pursed lip breathing. Start by breathing in (inhaling) through your nose for 1 second. Then, purse your lips as if you were going to whistle and breathe out (exhale) through the pursed lips for 2 seconds. 2. Diaphragmatic breathing. Start by putting one hand on your abdomen just above your waist. Inhale slowly through your nose. The hand on your abdomen should move out. Then purse your lips and exhale slowly. You should be able to feel the hand on your abdomen moving in as you exhale.  Learn and use controlled coughing to clear mucus from your lungs. Controlled coughing is a series of short, progressive coughs. The steps of controlled coughing are: 1. Lean your head slightly forward. 2. Breathe in deeply using diaphragmatic breathing. 3. Try to hold your breath for 3 seconds. 4. Keep your mouth slightly open while coughing twice. 5. Spit any mucus out into a tissue. 6. Rest  and repeat the steps once or twice as needed. Contact a health care provider if:  You are coughing up more mucus than usual.  There is a change in the color or thickness of your mucus.  Your breathing is more labored than usual.  Your breathing is faster than usual. Get help right away if:  You have shortness  of breath while you are resting.  You have shortness of breath that prevents you from: ? Being able to talk. ? Performing your usual physical activities.  You have chest pain lasting longer than 5 minutes.  Your skin color is more cyanotic than usual.  You measure low oxygen saturations for longer than 5 minutes with a pulse oximeter. This information is not intended to replace advice given to you by your health care provider. Make sure you discuss any questions you have with your health care provider. Document Released: 09/12/2005 Document Revised: 05/10/2016 Document Reviewed: 07/30/2013 Elsevier Interactive Patient Education  2017 [employer].

## 2017-07-08 NOTE — Progress Notes (Signed)
MEDICARE ANNUAL WELLNESS VISIT AND FOLLOW UP Assessment:    Essential hypertension - continue medications, DASH diet, exercise and monitor at home. Call if greater than 130/80.  -     CBC with Differential/Platelet -     BASIC METABOLIC PANEL WITH GFR -     Hepatic function panel -     TSH  Gastroesophageal reflux disease, esophagitis presence not specified Continue PPI/H2 blocker, diet discussed  Benign prostatic hyperplasia with urinary frequency Monitor symptoms  Hypercholesterolemia -continue medications, check lipids, decrease fatty foods, increase activity.  -     Lipid panel  Seizures ([place]) depakote continue, refill  Morbid obesity ([place]) - long discussion about weight loss, diet, and exercise  Encounter for Medicare annual wellness exam  Chronic obstructive pulmonary disease, unspecified COPD type ([place]) will get CXR, continue meds.   Diabetes mellitus due to underlying condition with stage 2 chronic kidney disease, without long-term current use of insulin ([place]) Discussed general issues about diabetes pathophysiology and management., Educational material distributed., Suggested low cholesterol diet., Encouraged aerobic exercise., Discussed foot care., Reminded to get yearly retinal exam. Will check this visit, needs to follow up at [facility] to noncompliance -     Hemoglobin A1c  COPD exacerbation ([place]) breo samples given, may need daily inhaler  Follow up with pulmonary -     azithromycin (ZITHROMAX Z-PAK) 250 MG tablet; 2 po day one, then 1 daily x 4 days -     predniSONE (DELTASONE) 10 MG tablet; Take 4 po qd x 3d then 3 po qd x 3 days then 2 po qd x 3days then 1 po qd x 3 days then stop  Over 30 minutes of exam, counseling, chart review, and critical decision making was performed Future Appointments Date Time Provider [place]  11/05/2017 10:30 AM Unk Pinto, MD [id number] None     Plan:   During the course of the visit the patient was  educated and counseled about appropriate screening and preventive services including:    Pneumococcal vaccine   Influenza vaccine  Prevnar 13  Td vaccine  Screening electrocardiogram  Colorectal cancer screening  Diabetes screening  Glaucoma screening  Nutrition counseling   Subjective:  Trevor [name] is a 71 y.o. male who presents for Medicare Annual Wellness Visit and 3 month follow up for HTN, hyperlipidemia, DM, and vitamin D Def.   He follows with the [facility] hospital but has not had labs done here for a long time.  His blood pressure has been controlled at home, today their BP is BP: 124/86 He does not workout. He denies chest pain.  He has COPD, 50 pack year smoking history, he has been having cough x 1.5-2 weeks, has productive cough, he has been having SOB/wheezing. He has been using his inhalers.  Denies orthopnea, PND, CP. Weight is down.  He is on cholesterol medication and denies myalgias.  Lab Results  Component Value Date   CHOL 184 12/22/2015   HDL 43 12/22/2015   LDLCALC 109 12/22/2015   TRIG 162 (H) 12/22/2015   CHOLHDL 4.3 12/22/2015    He has been working on diet and exercise for diabetes with CKD, and denies paresthesia of the feet, polydipsia, polyuria and visual disturbances. He is on glipzide and metformin.  Last A1C in the office was checked at the [place]. States sugars at home have been running 104 Last GFR  Lab Results  Component Value Date   [facility] 69 12/22/2015   Patient is on Vitamin  D supplement.   BMI is Body mass index is 27.39 kg/m., he is working on diet and exercise. Wt Readings from Last 3 Encounters:  07/08/17 196 lb 6.4 oz (89.1 kg)  02/06/17 207 lb 9.6 oz (94.2 kg)  12/22/15 209 lb (94.8 kg)     Medication Review: Current Outpatient Prescriptions on File Prior to Visit  Medication Sig Dispense Refill  . albuterol (VENTOLIN HFA) 108 (90 Base) MCG/ACT inhaler Inhale 2 puffs into the lungs every 4 (four) hours as needed for  wheezing or shortness of breath. 1 Inhaler 0  . aspirin 81 MG chewable tablet Chew 81 mg by mouth daily.    Marland Kitchen atenolol (TENORMIN) 100 MG tablet Take 100 mg by mouth daily.    . divalproex (DEPAKOTE ER) 500 MG 24 hr tablet Take 500 mg by mouth every evening.    . finasteride (PROSCAR) 5 MG tablet Take 5 mg by mouth every evening.    . fluticasone (FLONASE) 50 MCG/ACT nasal spray Place 2 sprays into both nostrils daily. 16 g 0  . glipiZIDE (GLUCOTROL) 5 MG tablet Take 2.5 mg by mouth every evening.    Marland Kitchen lisinopril (PRINIVIL,ZESTRIL) 40 MG tablet Take 40 mg by mouth daily.    . metFORMIN (GLUCOPHAGE) 1000 MG tablet Take 1,000 mg by mouth 2 (two) times daily with a meal.    . montelukast (SINGULAIR) 10 MG tablet Take 1 tablet (10 mg total) by mouth daily. 30 tablet 2  . Multiple Vitamin (MULTIVITAMIN) tablet Take 1 tablet by mouth daily.    . Multiple Vitamins-Minerals (ICAPS AREDS FORMULA PO) Take 1 tablet by mouth daily.    . pravastatin (PRAVACHOL) 80 MG tablet Take 80 mg by mouth every evening.    . promethazine-dextromethorphan (PROMETHAZINE-DM) 6.25-15 MG/5ML syrup Take 5-10 ML PO q8hrs prn for coughing 360 mL 1   No current facility-administered medications on file prior to visit.     Current Problems (verified) Patient Active Problem List   Diagnosis Date Noted  . COPD (chronic obstructive pulmonary disease) ([place]) 07/08/2017  . Morbid obesity ([place]) 12/22/2015  . Encounter for Medicare annual wellness exam 12/22/2015  . Hypercholesterolemia   . Hypertension   . Diabetes mellitus due to underlying condition with stage 2 chronic kidney disease ([place])   . Seizures ([place])   . BPH (benign prostatic hyperplasia)   . GERD (gastroesophageal reflux disease)     Screening Tests Immunization History  Administered Date(s) Administered  . Td 03/24/2002   Preventative care: Last colonoscopy: 2003 Dr. Deatra Ina CXR 05/2015  Prior vaccinations: TD or Tdap: 2013 at [place]  Influenza:  declines Pneumococcal: declines Prevnar13: declines Shingles/Zostavax: declines  Names of Other Physician/Practitioners you currently use: 1. [facility] here for primary care 2. Readers,  eye doctor at the [place] 3. NONE, dentures  Patient Care Team: Unk Pinto, MD as PCP - General (Internal Medicine) Inda Castle, MD as Consulting Physician (Gastroenterology) Newt Minion, MD as Consulting Physician (Orthopedic Surgery) Garvin Fila, MD as Consulting Physician (Neurology)   MEDICARE WELLNESS OBJECTIVES: Physical activity: Current Exercise Habits: The patient does not participate in regular exercise at present (fishes) Cardiac risk factors: Cardiac Risk Factors include: advanced age (>48men, >21 women);diabetes mellitus;dyslipidemia;male gender;obesity (BMI >30kg/m2);sedentary lifestyle;hypertension Depression/mood screen:   Depression screen [facility] 2/9 07/08/2017  Decreased Interest 0  Down, Depressed, Hopeless 0  PHQ - 2 Score 0    ADLs:  In your present state of health, do you have any  difficulty performing the following activities: 07/08/2017  Hearing? N  Vision? N  Difficulty concentrating or making decisions? N  Walking or climbing stairs? N  Dressing or bathing? N  Doing errands, shopping? N  Some recent data might be hidden     Cognitive Testing  Alert? Yes  Normal Appearance?Yes  Oriented to person? Yes  Place? Yes   Time? Yes  Recall of three objects?  Yes  Can perform simple calculations? Yes  Displays appropriate judgment?Yes  Can read the correct time from a watch face?Yes  EOL planning: Does Patient Have a Medical Advance Directive?: No Would patient like information on creating a medical advance directive?: Yes (ED - Information included in AVS)   Objective:   Today's Vitals   07/08/17 1607  BP: 124/86  Pulse: 67  Resp: 16  Temp: 97.6 F (36.4 C)  SpO2: 97%  Weight: 196 lb 6.4 oz (89.1 kg)  Height:  5\' 11"  (1.803 m)  PainSc: 4    Body mass index is 27.39 kg/m.  General appearance: alert, no distress, WD/WN, male HEENT: normocephalic, sclerae anicteric, TMs pearly, nares patent, no discharge or erythema, pharynx normal, decrease hearing, no teeth Oral cavity: MMM, no lesions, dentures Neck: supple, no lymphadenopathy, no thyromegaly, no masses Heart: RRR, normal S1, S2, no murmurs Lungs: CTA bilaterally, decreased breath sounds, wheezing LLL, no rhonchi, or rales Abdomen: +bs, soft, obese, non tender, non distended, no masses, no hepatomegaly, no splenomegaly Musculoskeletal: nontender, no swelling, no obvious deformity Extremities: no edema, no cyanosis, no clubbing Pulses: 2+ symmetric, upper and lower extremities, normal cap refill Neurological: alert, oriented x 3, CN2-12 intact, strength normal upper extremities and lower extremities, sensation normal throughout, DTRs 2+ throughout, no cerebellar signs, gait normal Psychiatric: normal affect, behavior normal, pleasant   Medicare Attestation I have personally reviewed: The patient's medical and social history Their use of alcohol, tobacco or illicit drugs Their current medications and supplements The patient's functional ability including ADLs,fall risks, home safety risks, cognitive, and hearing and visual impairment Diet and physical activities Evidence for depression or mood disorders  The patient's weight, height, BMI, and visual acuity have been recorded in the chart.  I have made referrals, counseling, and provided education to the patient based on review of the above and I have provided the patient with a written personalized care plan for preventive services.     Vicie Mutters, PA-C   07/08/2017

## 2017-07-09 LAB — HEMOGLOBIN A1C
Hgb A1c MFr Bld: 11.2 % — ABNORMAL HIGH (ref <5.7)
MEAN PLASMA GLUCOSE: 275 mg/dL

## 2017-07-09 LAB — BASIC METABOLIC PANEL WITH GFR
BUN: 12 mg/dL (ref 7–25)
CALCIUM: 9.5 mg/dL (ref 8.6–10.3)
CO2: 24 mmol/L (ref 20–31)
CREATININE: 0.87 mg/dL (ref 0.70–1.18)
Chloride: 104 mmol/L (ref 98–110)
GFR, EST NON AFRICAN AMERICAN: 87 mL/min (ref >=60)
GLUCOSE: 118 mg/dL — AB (ref 65–99)
Potassium: 4.6 mmol/L (ref 3.5–5.3)
Sodium: 141 mmol/L (ref 135–146)

## 2017-07-09 LAB — HEPATIC FUNCTION PANEL
ALBUMIN: 4.2 g/dL (ref 3.6–5.1)
ALT: 36 U/L (ref 9–46)
AST: 30 U/L (ref 10–35)
Alkaline Phosphatase: 48 U/L (ref 40–115)
BILIRUBIN INDIRECT: 0.4 mg/dL (ref 0.2–1.2)
Bilirubin, Direct: 0.1 mg/dL (ref <=0.2)
TOTAL PROTEIN: 6.8 g/dL (ref 6.1–8.1)
Total Bilirubin: 0.5 mg/dL (ref 0.2–1.2)

## 2017-07-09 LAB — LIPID PANEL
CHOLESTEROL: 198 mg/dL (ref <200)
HDL: 24 mg/dL — ABNORMAL LOW (ref >40)
LDL Cholesterol: 113 mg/dL — ABNORMAL HIGH (ref <100)
TRIGLYCERIDES: 305 mg/dL — AB (ref <150)
Total CHOL/HDL Ratio: 8.3 Ratio — ABNORMAL HIGH (ref <5.0)
VLDL: 61 mg/dL — ABNORMAL HIGH (ref <30)

## 2017-07-09 NOTE — Progress Notes (Signed)
Pt aware of lab results & voiced understanding of those results. Pt was transferred to schedule DM teaching in 1 mth.

## 2017-08-12 ENCOUNTER — Ambulatory Visit: Payer: Self-pay | Admitting: Physician Assistant

## 2017-08-29 ENCOUNTER — Ambulatory Visit (INDEPENDENT_AMBULATORY_CARE_PROVIDER_SITE_OTHER): Payer: Medicare Other | Admitting: Physician Assistant

## 2017-08-29 ENCOUNTER — Ambulatory Visit (HOSPITAL_COMMUNITY)
Admission: RE | Admit: 2017-08-29 | Discharge: 2017-08-29 | Disposition: A | Discharge status: Home / Self Care | Payer: Medicare Other | Source: Ambulatory Visit | Attending: Physician Assistant | Admitting: Physician Assistant

## 2017-08-29 VITALS — BP 140/82 | HR 72 | Temp 97.0°F | Resp 18 | Ht 71.0 in | Wt 202.0 lb

## 2017-08-29 DIAGNOSIS — R062 Wheezing: Secondary | ICD-10-CM | POA: Insufficient documentation

## 2017-08-29 DIAGNOSIS — J441 Chronic obstructive pulmonary disease with (acute) exacerbation: Secondary | ICD-10-CM | POA: Diagnosis not present

## 2017-08-29 DIAGNOSIS — R05 Cough: Secondary | ICD-10-CM | POA: Diagnosis not present

## 2017-08-29 MED ORDER — IPRATROPIUM-ALBUTEROL 0.5-2.5 (3) MG/3ML IN SOLN: 3.0000 mL | Freq: Once | RESPIRATORY_TRACT | Status: DC

## 2017-08-29 MED ORDER — AZITHROMYCIN 250 MG PO TABS: ORAL_TABLET | ORAL | 1 refills | Status: AC

## 2017-08-29 MED ORDER — PREDNISONE 20 MG PO TABS: ORAL_TABLET | ORAL | 0 refills | Status: DC

## 2017-08-29 NOTE — Progress Notes (Signed)
Subjective:    Patient ID: Trevor [name], male    DOB: [birth year], 71 y.o.   MRN: [id number]  HPI 71 y.o. former smoking obese WM with history of COPD presents with cold symptoms x 1 week, has been using flonase, albuterol, cough syrup and allergy pill and not getting better. Non productive cough and wheezing, worse at night, will wake up around 330 with SOB and have to sit on the bed, has swelling bilateral legs, weight is up. No fever, chills.   Wt Readings from Last 3 Encounters:  08/29/17 202 lb (91.6 kg)  07/08/17 196 lb 6.4 oz (89.1 kg)  02/06/17 207 lb 9.6 oz (94.2 kg)     Blood pressure 140/82, pulse 72, temperature (!) 97 F (36.1 C), resp. rate 18, height 5\' 11"  (1.803 m), weight 202 lb (91.6 kg).  Medications Current Outpatient Prescriptions on File Prior to Visit  Medication Sig  . albuterol (VENTOLIN HFA) 108 (90 Base) MCG/ACT inhaler Inhale 2 puffs into the lungs every 4 (four) hours as needed for wheezing or shortness of breath.  Marland Kitchen aspirin 81 MG chewable tablet Chew 81 mg by mouth daily.  Marland Kitchen atenolol (TENORMIN) 100 MG tablet Take 100 mg by mouth daily.  . divalproex (DEPAKOTE ER) 500 MG 24 hr tablet Take 500 mg by mouth every evening.  . finasteride (PROSCAR) 5 MG tablet Take 5 mg by mouth every evening.  . fluticasone (FLONASE) 50 MCG/ACT nasal spray Place 2 sprays into both nostrils daily.  Marland Kitchen glipiZIDE (GLUCOTROL) 5 MG tablet Take 2.5 mg by mouth every evening.  Marland Kitchen lisinopril (PRINIVIL,ZESTRIL) 40 MG tablet Take 40 mg by mouth daily.  . metFORMIN (GLUCOPHAGE) 1000 MG tablet Take 1,000 mg by mouth 2 (two) times daily with a meal.  . montelukast (SINGULAIR) 10 MG tablet Take 1 tablet (10 mg total) by mouth daily.  . Multiple Vitamin (MULTIVITAMIN) tablet Take 1 tablet by mouth daily.  . Multiple Vitamins-Minerals (ICAPS AREDS FORMULA PO) Take 1 tablet by mouth daily.  . pravastatin (PRAVACHOL) 80 MG tablet Take 80 mg by mouth every evening.  .  promethazine-dextromethorphan (PROMETHAZINE-DM) 6.25-15 MG/5ML syrup Take 5-10 ML PO q8hrs prn for coughing   No current facility-administered medications on file prior to visit.     Problem list He has Hypercholesterolemia; Hypertension; Diabetes mellitus due to underlying condition with stage 2 chronic kidney disease ([place]); Seizures ([place]); BPH (benign prostatic hyperplasia); GERD (gastroesophageal reflux disease); Morbid obesity ([place]); Encounter for Medicare annual wellness exam; and COPD (chronic obstructive pulmonary disease) ([place]) on his problem list.   Review of Systems  Constitutional: Negative for chills, diaphoresis and fever.  HENT: Positive for congestion and postnasal drip. Negative for ear pain, rhinorrhea, sinus pressure, sneezing, sore throat, trouble swallowing and voice change.   Eyes: Negative.   Respiratory: Positive for cough, shortness of breath and wheezing. Negative for apnea, choking, chest tightness and stridor.   Cardiovascular: Positive for leg swelling. Negative for chest pain and palpitations.  Gastrointestinal: Negative.   Genitourinary: Negative.   Musculoskeletal: Negative.  Negative for neck pain.  Neurological: Negative.  Negative for headaches.       Objective:   Physical Exam  Constitutional: He is oriented to person, place, and time. He appears well-developed and well-nourished.  HENT:  Head: Normocephalic and atraumatic.  Right Ear: External ear normal.  Left Ear: External ear normal.  Nose: Nose normal.  Mouth/Throat: Oropharynx is clear and moist.  Eyes: Pupils are equal, round, and reactive to  light. Conjunctivae are normal.  Neck: Normal range of motion. Neck supple.  Cardiovascular: Normal rate, regular rhythm and normal heart sounds.   No murmur heard. Pulmonary/Chest: Effort normal. No respiratory distress. He has wheezes (diffuse expiratory). He has no rales. He exhibits no tenderness.  Abdominal: Soft. Bowel sounds are normal.  obese   Musculoskeletal: He exhibits edema (1-2+ edema).  Lymphadenopathy:    He has no cervical adenopathy.  Neurological: He is alert and oriented to person, place, and time.  Skin: Skin is warm and dry.       Assessment & Plan:    Wheezing/COPD exacerbation Weight is up some with some questionable PND versus COPD/orthopnea, get CXR treat like COPD exacerbation Fluid restrict and monitor weight daily Go to ER if any worsening shortness of breath, chest pain, etc -     DG Chest 2 View; Future -     predniSONE (DELTASONE) 20 MG tablet; 2 tablets daily for 3 days, 1 tablet daily for 4 days. -     azithromycin (ZITHROMAX) 250 MG tablet; Take 2 tablets (500 mg) on  Day 1,  followed by 1 tablet (250 mg) once daily on Days 2 through 5. -     ipratropium-albuterol (DUONEB) 0.5-2.5 (3) MG/3ML nebulizer solution 3 mL; Take 3 mLs by nebulization once.  No future appointments.

## 2017-08-29 NOTE — Patient Instructions (Signed)
[facility]   Acute Bronchitis, Adult Acute bronchitis is sudden (acute) swelling of the air tubes (bronchi) in the lungs. Acute bronchitis causes these tubes to fill with mucus, which can make it hard to breathe. It can also cause coughing or wheezing. In adults, acute bronchitis usually goes away within 2 weeks. A cough caused by bronchitis may last up to 3 weeks. Smoking, allergies, and asthma can make the condition worse. Repeated episodes of bronchitis may cause further lung problems, such as chronic obstructive pulmonary disease (COPD). What are the causes? This condition can be caused by germs and by substances that irritate the lungs, including:  Cold and flu viruses. This condition is most often caused by the same virus that causes a cold.  Bacteria.  Exposure to tobacco smoke, dust, fumes, and air pollution.  What increases the risk? This condition is more likely to develop in people who:  Have close contact with someone with acute bronchitis.  Are exposed to lung irritants, such as tobacco smoke, dust, fumes, and vapors.  Have a weak immune system.  Have a respiratory condition such as asthma.  What are the signs or symptoms? Symptoms of this condition include:  A cough.  Coughing up clear, yellow, or green mucus.  Wheezing.  Chest congestion.  Shortness of breath.  A fever.  Body aches.  Chills.  A sore throat.  How is this diagnosed? This condition is usually diagnosed with a physical exam. During the exam, your health care provider may order tests, such as chest X-rays, to rule out other conditions. He or she may also:  Test a sample of your mucus for bacterial infection.  Check the level of oxygen in your blood. This is done to check for pneumonia.  Do a chest X-ray or lung function testing to rule out pneumonia and other conditions.  Perform blood tests.  Your health care provider will also ask about your  symptoms and medical history. How is this treated? Most cases of acute bronchitis clear up over time without treatment. Your health care provider may recommend:  Drinking more fluids. Drinking more makes your mucus thinner, which may make it easier to breathe.  Taking a medicine for a fever or cough.  Taking an antibiotic medicine.  Using an inhaler to help improve shortness of breath and to control a cough.  Using a cool mist vaporizer or humidifier to make it easier to breathe.  Follow these instructions at home: Medicines  Take over-the-counter and prescription medicines only as told by your health care provider.  If you were prescribed an antibiotic, take it as told by your health care provider. Do not stop taking the antibiotic even if you start to feel better. General instructions  Get plenty of rest.  Drink enough fluids to keep your urine clear or pale yellow.  Avoid smoking and secondhand smoke. Exposure to cigarette smoke or irritating chemicals will make bronchitis worse. If you smoke and you need help quitting, ask your health care provider. Quitting smoking will help your lungs heal faster.  Use an inhaler, cool mist vaporizer, or humidifier as told by your health care provider.  Keep all follow-up visits as told by your health care provider. This is important. How is this prevented? To lower your risk of getting this condition again:  Wash your hands often with soap and water. If soap and water are not available, use hand sanitizer.  Avoid contact with people who have cold  symptoms.  Try not to touch your hands to your mouth, nose, or eyes.  Make sure to get the flu shot every year.  Contact a health care provider if:  Your symptoms do not improve in 2 weeks of treatment. Get help right away if:  You cough up blood.  You have chest pain.  You have severe shortness of breath.  You become dehydrated.  You faint or keep feeling like you are going to  faint.  You keep vomiting.  You have a severe headache.  Your fever or chills gets worse. This information is not intended to replace advice given to you by your health care provider. Make sure you discuss any questions you have with your health care provider. Document Released: 01/10/2005 Document Revised: 06/27/2016 Document Reviewed: 05/23/2016 Elsevier Interactive Patient Education  2017 [employer].

## 2017-08-29 NOTE — Progress Notes (Signed)
Pt aware of lab results & voiced understanding of those results.

## 2017-10-02 ENCOUNTER — Encounter: Payer: Self-pay | Admitting: Internal Medicine

## 2017-10-02 ENCOUNTER — Ambulatory Visit (INDEPENDENT_AMBULATORY_CARE_PROVIDER_SITE_OTHER): Payer: Medicare Other | Admitting: Internal Medicine

## 2017-10-02 VITALS — BP 140/84 | HR 72 | Temp 97.3°F | Resp 18 | Ht 71.0 in | Wt 205.2 lb

## 2017-10-02 DIAGNOSIS — R569 Unspecified convulsions: Secondary | ICD-10-CM

## 2017-10-02 DIAGNOSIS — Z79899 Other long term (current) drug therapy: Secondary | ICD-10-CM

## 2017-10-02 DIAGNOSIS — I10 Essential (primary) hypertension: Secondary | ICD-10-CM

## 2017-10-02 DIAGNOSIS — N182 Chronic kidney disease, stage 2 (mild): Secondary | ICD-10-CM | POA: Diagnosis not present

## 2017-10-02 DIAGNOSIS — E0822 Diabetes mellitus due to underlying condition with diabetic chronic kidney disease: Secondary | ICD-10-CM

## 2017-10-02 DIAGNOSIS — Z23 Encounter for immunization: Secondary | ICD-10-CM

## 2017-10-02 DIAGNOSIS — J449 Chronic obstructive pulmonary disease, unspecified: Secondary | ICD-10-CM

## 2017-10-02 DIAGNOSIS — E782 Mixed hyperlipidemia: Secondary | ICD-10-CM | POA: Diagnosis not present

## 2017-10-02 DIAGNOSIS — E559 Vitamin D deficiency, unspecified: Secondary | ICD-10-CM | POA: Diagnosis not present

## 2017-10-02 MED ORDER — GLIPIZIDE 5 MG PO TABS: ORAL_TABLET | ORAL | 3 refills | Status: DC

## 2017-10-02 MED ORDER — METFORMIN HCL ER 500 MG PO TB24: ORAL_TABLET | ORAL | 3 refills | Status: DC

## 2017-10-02 MED ORDER — MONTELUKAST SODIUM 10 MG PO TABS: ORAL_TABLET | ORAL | 3 refills | Status: DC

## 2017-10-02 NOTE — Progress Notes (Signed)
This very nice 71 y.o. MWM presents for  follow up with Hypertension, Hyperlipidemia, Pre-Diabetes and Vitamin D Deficiency.       Patient is also followed at the [facility] and was treated here 1 month ago for a flare of Asthmatic Bronchitis is with A Z-Pak and Prednisone taper. He reports initial improvement, but reports intermittent wheezing and sputum - yellowish green. He says he's on Breo daily from the [place].      Patient is treated for HTN & BP has been controlled at home. Today's BP is at goal -  140/84. Patient has had no complaints of any cardiac type chest pain, palpitations, orthopnea / PND, dizziness, claudication, or dependent edema.     Hyperlipidemia is controlled with diet & meds. Patient denies myalgias or other med SE's. Last Lipids were  Lab Results  Component Value Date   CHOL 198 07/08/2017   HDL 24 (L) 07/08/2017   LDLCALC 113 (H) 07/08/2017   TRIG 305 (H) 07/08/2017   CHOLHDL 8.3 (H) 07/08/2017      Also, the patient has history of T2_NIDDM and has had no symptoms of reactive hypoglycemia, diabetic polys, paresthesias or visual blurring.  Last A1c was  Lab Results  Component Value Date   HGBA1C 11.2 (H) 07/08/2017      Further, the patient also has history of Vitamin D Deficiency and supplements vitamin D without any suspected side-effects. Last vitamin D was  No results found for: VD25OH Current Outpatient Prescriptions on File Prior to Visit  Medication Sig  . albuterol (VENTOLIN HFA) 108 (90 Base) MCG/ACT inhaler Inhale 2 puffs into the lungs every 4 (four) hours as needed for wheezing or shortness of breath.  Marland Kitchen aspirin 81 MG chewable tablet Chew 81 mg by mouth daily.  Marland Kitchen atenolol (TENORMIN) 100 MG tablet Take 100 mg by mouth daily.  . divalproex (DEPAKOTE ER) 500 MG 24 hr tablet Take 500 mg by mouth every evening.  . finasteride (PROSCAR) 5 MG tablet Take 5 mg by mouth every evening.  . fluticasone (FLONASE) 50 MCG/ACT nasal spray Place 2 sprays into both nostrils  daily.  Marland Kitchen lisinopril (PRINIVIL,ZESTRIL) 40 MG tablet Take 40 mg by mouth daily.  . Multiple Vitamin (MULTIVITAMIN) tablet Take 1 tablet by mouth daily.  . Multiple Vitamins-Minerals (ICAPS AREDS FORMULA PO) Take 1 tablet by mouth daily.  . pravastatin (PRAVACHOL) 80 MG tablet Take 80 mg by mouth every evening.  . promethazine-dextromethorphan (PROMETHAZINE-DM) 6.25-15 MG/5ML syrup Take 5-10 ML PO q8hrs prn for coughing   No current facility-administered medications on file prior to visit.    Allergies  Allergen Reactions  . Penicillins Other (See Comments)    Unknown reaction  . Latex Hives and Rash   PMHx:   Past Medical History:  Diagnosis Date  . BPH (benign prostatic hyperplasia)   . COPD (chronic obstructive pulmonary disease) ([place])   . Diabetes mellitus without complication ([place])   . GERD (gastroesophageal reflux disease)   . Hypercholesterolemia   . Hypertension   . Seizures ([place])   . Type II or unspecified type diabetes mellitus without mention of complication, not stated as uncontrolled    Immunization History  Administered Date(s) Administered  . Influenza, High Dose Seasonal PF 10/02/2017  . Td 03/24/2002   Past Surgical History:  Procedure Laterality Date  . HAND SURGERY Left 1953  . KNEE ARTHROSCOPY Left 1998   FHx:    Reviewed / unchanged  SHx:    Reviewed /  unchanged   Systems Review:  Constitutional: Denies fever, chills, wt changes, headaches, insomnia, fatigue, night sweats, change in appetite. Eyes: Denies redness, blurred vision, diplopia, discharge, itchy, watery eyes.  ENT: Denies discharge, congestion, post nasal drip, epistaxis, sore throat, earache, hearing loss, dental pain, tinnitus, vertigo, sinus pain, snoring.  CV: Denies chest pain, palpitations, irregular heartbeat, syncope, dyspnea, diaphoresis, orthopnea, PND, claudication or edema. Respiratory: denies cough, dyspnea, DOE, pleurisy, hoarseness, laryngitis, wheezing.  Gastrointestinal:  Denies dysphagia, odynophagia, heartburn, reflux, water brash, abdominal pain or cramps, nausea, vomiting, bloating, diarrhea, constipation, hematemesis, melena, hematochezia  or hemorrhoids. Genitourinary: Denies dysuria, frequency, urgency, nocturia, hesitancy, discharge, hematuria or flank pain. Musculoskeletal: Denies arthralgias, myalgias, stiffness, jt. swelling, pain, limping or strain/sprain.  Skin: Denies pruritus, rash, hives, warts, acne, eczema or change in skin lesion(s). Neuro: No weakness, tremor, incoordination, spasms, paresthesia or pain. Psychiatric: Denies confusion, memory loss or sensory loss. Endo: Denies change in weight, skin or hair change.  Heme/Lymph: No excessive bleeding, bruising or enlarged lymph nodes.  Physical Exam  BP 140/84   Pulse 72   Temp (!) 97.3 F (36.3 C)   Resp 18   Ht 5\' 11"  (1.803 m)   Wt 205 lb 3.2 oz (93.1 kg)   SpO2 98%   BMI 28.62 kg/m   Appears well nourished, well groomed  and in no distress.  Eyes: PERRLA, EOMs, conjunctiva no swelling or erythema. Sinuses: No frontal/maxillary tenderness ENT/Mouth: EAC's clear, TM's nl w/o erythema, bulging. Nares clear w/o erythema, swelling, exudates. Oropharynx clear without erythema or exudates. Oral hygiene is good. Tongue normal, non obstructing. Hearing intact.  Neck: Supple. Thyroid nl. Car 2+/2+ without bruits, nodes or JVD. Chest: Respirations nl with BS clear & equal w/o rales, rhonchi, wheezing or stridor.  Cor: Heart sounds normal w/ regular rate and rhythm without sig. murmurs, gallops, clicks or rubs. Peripheral pulses normal and equal  without edema.  Abdomen: Soft & bowel sounds normal. Non-tender w/o guarding, rebound, hernias, masses or organomegaly.  Lymphatics: Unremarkable.  Musculoskeletal: Full ROM all peripheral extremities, joint stability, 5/5 strength and normal gait.  Skin: Warm, dry without exposed rashes, lesions or ecchymosis apparent.  Neuro: Cranial nerves  intact, reflexes equal bilaterally. Sensory-motor testing grossly intact. Tendon reflexes grossly intact.  Pysch: Alert & oriented x 3.  Insight and judgement nl & appropriate. No ideations.  Assessment and Plan:  1. Essential hypertension  - Continue medication, monitor blood pressure at home.  - Continue DASH diet. Reminder to go to the ER if any CP,  SOB, nausea, dizziness, severe HA, changes vision/speech.  - CBC with Differential/Platelet - BASIC METABOLIC PANEL WITH GFR - Magnesium - TSH  2. Hyperlipidemia, mixed  - Continue diet/meds, exercise,& lifestyle modifications.  - Continue monitor periodic cholesterol/liver & renal functions   - Hepatic function panel - Lipid panel - TSH  3. Diabetes mellitus due to underlying condition with stage 2 chronic kidney disease, without long-term current use of insulin (HCC)  - Continue diet, exercise, lifestyle modifications.  - Monitor appropriate labs.  - Hemoglobin A1c - Insulin, random  - metFORMIN (GLUCOPHAGE XR) 500 MG 24 hr tablet; Take 2 tablets 2 x / day for Diabetes  Dispense: 360 tablet; Refill: 3  - glipiZIDE (GLUCOTROL) 5 MG tablet; Take 1 tablet 3 x / day with meals  for Blood Sugar  Dispense: 270 tablet; Refill: 3  4. Vitamin D deficiency  - Continue supplementation. - VITAMIN D 25 Hydroxy   5. Chronic obstructive pulmonary disease  -  Restart montelukast (SINGULAIR) 10 MG tablet; Take 1 tablet daily for Allergies & Asthma  Dispense: 90 tablet; Refill: 3  - Given sx's Incruse x 6 weeks to use/complement his Breo   6. Seizure ([place])  - Valproic acid level  7. Medication management  - CBC with Differential/Platelet - BASIC METABOLIC PANEL WITH GFR - Hepatic function panel - Magnesium - Lipid panel - TSH - Hemoglobin A1c - Insulin, random - VITAMIN D 25 Hydroxy - Valproic acid level  8. Need for immunization against influenza  - Flu vaccine HIGH DOSE PF (Fluzone High dose)          Discussed   regular exercise, BP monitoring, weight control to achieve/maintain BMI less than 25 and discussed med and SE's. Recommended labs to assess and monitor clinical status with further disposition pending results of labs. Over 30 minutes of exam, counseling, chart review was performed.

## 2017-10-02 NOTE — Patient Instructions (Signed)

## 2017-10-08 ENCOUNTER — Other Ambulatory Visit: Payer: Medicare Other

## 2017-10-08 ENCOUNTER — Other Ambulatory Visit: Payer: Self-pay | Admitting: Internal Medicine

## 2017-10-08 DIAGNOSIS — E559 Vitamin D deficiency, unspecified: Secondary | ICD-10-CM | POA: Insufficient documentation

## 2017-10-08 DIAGNOSIS — Z79899 Other long term (current) drug therapy: Secondary | ICD-10-CM

## 2017-10-08 DIAGNOSIS — I10 Essential (primary) hypertension: Secondary | ICD-10-CM | POA: Diagnosis not present

## 2017-10-08 DIAGNOSIS — E782 Mixed hyperlipidemia: Secondary | ICD-10-CM

## 2017-10-08 DIAGNOSIS — E0822 Diabetes mellitus due to underlying condition with diabetic chronic kidney disease: Secondary | ICD-10-CM

## 2017-10-08 DIAGNOSIS — N182 Chronic kidney disease, stage 2 (mild): Secondary | ICD-10-CM

## 2017-10-08 DIAGNOSIS — R569 Unspecified convulsions: Secondary | ICD-10-CM | POA: Diagnosis not present

## 2017-10-09 ENCOUNTER — Encounter: Payer: Self-pay | Admitting: *Deleted

## 2017-10-09 ENCOUNTER — Other Ambulatory Visit: Payer: Self-pay | Admitting: Internal Medicine

## 2017-10-09 DIAGNOSIS — E782 Mixed hyperlipidemia: Secondary | ICD-10-CM

## 2017-10-09 LAB — BASIC METABOLIC PANEL WITH GFR
BUN: 16 mg/dL (ref 7–25)
CALCIUM: 9.2 mg/dL (ref 8.6–10.3)
CO2: 29 mmol/L (ref 20–32)
CREATININE: 0.84 mg/dL (ref 0.70–1.18)
Chloride: 100 mmol/L (ref 98–110)
GFR, EST AFRICAN AMERICAN: 103 mL/min/{1.73_m2} (ref >=60)
GFR, Est Non African American: 89 mL/min/{1.73_m2} (ref >=60)
Glucose, Bld: 379 mg/dL — ABNORMAL HIGH (ref 65–99)
Potassium: 4.7 mmol/L (ref 3.5–5.3)
Sodium: 138 mmol/L (ref 135–146)

## 2017-10-09 LAB — TSH: TSH: 2.85 mIU/L (ref 0.40–4.50)

## 2017-10-09 LAB — HEPATIC FUNCTION PANEL
AG Ratio: 1.8 (calc) (ref 1.0–2.5)
ALBUMIN MSPROF: 4 g/dL (ref 3.6–5.1)
ALT: 47 U/L — AB (ref 9–46)
AST: 28 U/L (ref 10–35)
Alkaline phosphatase (APISO): 54 U/L (ref 40–115)
BILIRUBIN DIRECT: 0.1 mg/dL (ref 0.0–0.2)
GLOBULIN: 2.2 g/dL (ref 1.9–3.7)
Indirect Bilirubin: 0.4 mg/dL (calc) (ref 0.2–1.2)
Total Bilirubin: 0.5 mg/dL (ref 0.2–1.2)
Total Protein: 6.2 g/dL (ref 6.1–8.1)

## 2017-10-09 LAB — LIPID PANEL
CHOLESTEROL: 193 mg/dL (ref <200)
HDL: 28 mg/dL — ABNORMAL LOW (ref >40)
LDL CHOLESTEROL (CALC): 121 mg/dL — AB
NON-HDL CHOLESTEROL (CALC): 165 mg/dL — AB (ref <130)
Total CHOL/HDL Ratio: 6.9 (calc) — ABNORMAL HIGH (ref <5.0)
Triglycerides: 310 mg/dL — ABNORMAL HIGH (ref <150)

## 2017-10-09 LAB — CBC WITH DIFFERENTIAL/PLATELET
BASOS PCT: 0.9 %
Basophils Absolute: 68 cells/uL (ref 0–200)
EOS PCT: 9.3 %
Eosinophils Absolute: 698 cells/uL — ABNORMAL HIGH (ref 15–500)
HEMATOCRIT: 41.1 % (ref 38.5–50.0)
HEMOGLOBIN: 13.4 g/dL (ref 13.2–17.1)
LYMPHS ABS: 2790 {cells}/uL (ref 850–3900)
MCH: 29.2 pg (ref 27.0–33.0)
MCHC: 32.6 g/dL (ref 32.0–36.0)
MCV: 89.5 fL (ref 80.0–100.0)
MPV: 12 fL (ref 7.5–12.5)
Monocytes Relative: 8 %
NEUTROS ABS: 3345 {cells}/uL (ref 1500–7800)
Neutrophils Relative %: 44.6 %
Platelets: 243 10*3/uL (ref 140–400)
RBC: 4.59 10*6/uL (ref 4.20–5.80)
RDW: 12.4 % (ref 11.0–15.0)
Total Lymphocyte: 37.2 %
WBC: 7.5 10*3/uL (ref 3.8–10.8)
WBCMIX: 600 {cells}/uL (ref 200–950)

## 2017-10-09 LAB — MAGNESIUM: MAGNESIUM: 1.6 mg/dL (ref 1.5–2.5)

## 2017-10-09 LAB — VITAMIN D 25 HYDROXY (VIT D DEFICIENCY, FRACTURES): Vit D, 25-Hydroxy: 37 ng/mL (ref 30–100)

## 2017-10-09 LAB — INSULIN, RANDOM: Insulin: 32.8 u[IU]/mL — ABNORMAL HIGH (ref 2.0–19.6)

## 2017-10-09 LAB — HEMOGLOBIN A1C
Hgb A1c MFr Bld: 12.7 % of total Hgb — ABNORMAL HIGH (ref <5.7)
Mean Plasma Glucose: 318 (calc)
eAG (mmol/L): 17.6 (calc)

## 2017-10-09 LAB — VALPROIC ACID LEVEL: Valproic Acid Lvl: 34.4 mg/L — ABNORMAL LOW (ref 50.0–100.0)

## 2017-10-09 MED ORDER — ROSUVASTATIN CALCIUM 40 MG PO TABS: ORAL_TABLET | ORAL | 5 refills | Status: DC

## 2017-10-11 ENCOUNTER — Other Ambulatory Visit: Payer: Self-pay | Admitting: *Deleted

## 2017-10-11 ENCOUNTER — Encounter: Payer: Self-pay | Admitting: *Deleted

## 2017-10-11 DIAGNOSIS — E782 Mixed hyperlipidemia: Secondary | ICD-10-CM

## 2017-10-11 MED ORDER — ROSUVASTATIN CALCIUM 40 MG PO TABS: ORAL_TABLET | ORAL | 1 refills | Status: DC

## 2017-10-11 MED ORDER — DIVALPROEX SODIUM ER 500 MG PO TB24: 500.0000 mg | ORAL_TABLET | Freq: Two times a day (BID) | ORAL | 1 refills | Status: AC

## 2017-10-23 ENCOUNTER — Other Ambulatory Visit: Payer: Self-pay | Admitting: Internal Medicine

## 2017-10-24 ENCOUNTER — Telehealth: Payer: Self-pay | Admitting: *Deleted

## 2017-10-24 NOTE — Telephone Encounter (Signed)
Patient's spouse called and states the patient has an appointment at the [facility] and asked if it is necessary to keep his OV with Dr Melford Aase, on 11/05/2017.  She is concerned about the cost.  Per Dr Melford Aase, the patient needs to keep his appointment due to his elevated glucose levels and needs to have labs every 3 months, either her or at the [place].  Spouse is aware.

## 2017-10-25 ENCOUNTER — Ambulatory Visit: Payer: Self-pay | Admitting: Adult Health

## 2017-11-05 ENCOUNTER — Ambulatory Visit (INDEPENDENT_AMBULATORY_CARE_PROVIDER_SITE_OTHER): Payer: Medicare Other | Admitting: Internal Medicine

## 2017-11-05 ENCOUNTER — Encounter: Payer: Self-pay | Admitting: Internal Medicine

## 2017-11-05 ENCOUNTER — Ambulatory Visit: Payer: Self-pay | Admitting: Internal Medicine

## 2017-11-05 VITALS — BP 130/68 | HR 80 | Temp 97.2°F | Resp 18 | Ht 71.0 in | Wt 209.4 lb

## 2017-11-05 DIAGNOSIS — N182 Chronic kidney disease, stage 2 (mild): Secondary | ICD-10-CM

## 2017-11-05 DIAGNOSIS — E0822 Diabetes mellitus due to underlying condition with diabetic chronic kidney disease: Secondary | ICD-10-CM

## 2017-11-05 DIAGNOSIS — R569 Unspecified convulsions: Secondary | ICD-10-CM | POA: Diagnosis not present

## 2017-11-05 DIAGNOSIS — I10 Essential (primary) hypertension: Secondary | ICD-10-CM | POA: Diagnosis not present

## 2017-11-05 DIAGNOSIS — Z79899 Other long term (current) drug therapy: Secondary | ICD-10-CM | POA: Diagnosis not present

## 2017-11-05 DIAGNOSIS — E559 Vitamin D deficiency, unspecified: Secondary | ICD-10-CM | POA: Diagnosis not present

## 2017-11-05 DIAGNOSIS — E782 Mixed hyperlipidemia: Secondary | ICD-10-CM

## 2017-11-05 MED ORDER — ROSUVASTATIN CALCIUM 40 MG PO TABS: ORAL_TABLET | ORAL | 1 refills | Status: DC

## 2017-11-05 NOTE — Progress Notes (Signed)
This very nice 71 y.o. MWM returns for  follow up with Hypertension, Hyperlipidemia, T2_DM, hx/o Seizure Disorder  and Vitamin D Deficiency. His Depakote returned low at 34 (goal 50-100) and he was advised to increase his Depakote 500 mg to 2 x/ day.      Patient is treated for HTN (2003) & BP has been controlled at home. Today's BP is at goal-  130/68. Patient has had no complaints of any cardiac type chest pain, palpitations, dyspnea / orthopnea / PND, dizziness, claudication, or dependent edema.     Hyperlipidemia is controlled with diet & meds. Patient denies myalgias or other med SE's. Last Lipids were not at goal and he was advised to switch his Pravastatin to Crestor 40 mg x 1/2 tab 3 x/ week : Lab Results  Component Value Date   CHOL 193 10/08/2017   HDL 28 (L) 10/08/2017   LDLCALC 113 (H) 07/08/2017   TRIG 310 (H) 10/08/2017   CHOLHDL 6.9 (H) 10/08/2017      Also, the patient has history of T2_NIDDM (A1c 6.8% /2008 and 7.0% /2009) and has had no symptoms of reactive hypoglycemia, diabetic polys, paresthesias or visual blurring.  Last A1c was very elevated at 12.7%  On Oct 27 and he was advised to increase his MF from 1 tab up to 2 tabs 2 x/ day and increase his Glipizide  From 7.5 mg/day up to 10 mg 2 x/ day.     Further, the patient also has history of Vitamin D Deficiency ("25" / 2008)  and supplements vitamin D without any suspected side-effects. Last vitamin D was very low and he was advised to increase Vit D to 10,000 units / daily: Lab Results  Component Value Date   VD25OH 37 10/08/2017   Current Outpatient Medications on File Prior to Visit  Medication Sig  . albuterol (VENTOLIN HFA) 108 (90 Base) MCG/ACT inhaler Inhale 2 puffs into the lungs every 4 (four) hours as needed for wheezing or shortness of breath.  Marland Kitchen aspirin 81 MG chewable tablet Chew 81 mg by mouth daily.  Marland Kitchen atenolol (TENORMIN) 100 MG tablet Take 100 mg by mouth daily.  . divalproex (DEPAKOTE ER) 500 MG  24 hr tablet Take 1 tablet (500 mg total) by mouth 2 (two) times daily.  . finasteride (PROSCAR) 5 MG tablet Take 5 mg by mouth every evening.  . fluticasone (FLONASE) 50 MCG/ACT nasal spray Place 2 sprays into both nostrils daily.  Marland Kitchen glipiZIDE (GLUCOTROL) 10 MG tablet Take 10 mg by mouth 2 (two) times daily before a meal.  . lisinopril (PRINIVIL,ZESTRIL) 40 MG tablet Take 40 mg by mouth daily.  Marland Kitchen MAGNESIUM PO Take 420 mg by mouth 2 (two) times daily.  . metFORMIN (GLUCOPHAGE XR) 500 MG 24 hr tablet Take 2 tablets 2 x / day for Diabetes  . montelukast (SINGULAIR) 10 MG tablet Take 1 tablet daily for Allergies & Asthma  . Multiple Vitamin (MULTIVITAMIN) tablet Take 1 tablet by mouth daily.  . Multiple Vitamins-Minerals (ICAPS AREDS FORMULA PO) Take 1 tablet by mouth daily.   No current facility-administered medications on file prior to visit.    Allergies  Allergen Reactions  . Penicillins Other (See Comments)    Unknown reaction  . Latex Hives and Rash   PMHx:   Past Medical History:  Diagnosis Date  . BPH (benign prostatic hyperplasia)   . COPD (chronic obstructive pulmonary disease) ([place])   . Diabetes mellitus without complication (  HCC)   . GERD (gastroesophageal reflux disease)   . Hypercholesterolemia   . Hypertension   . Seizures ([place])   . Type II or unspecified type diabetes mellitus without mention of complication, not stated as uncontrolled    Immunization History  Administered Date(s) Administered  . Influenza, High Dose Seasonal PF 10/02/2017  . Td 03/24/2002   Past Surgical History:  Procedure Laterality Date  . HAND SURGERY Left 1953  . KNEE ARTHROSCOPY Left 1998   FHx:    Reviewed / unchanged  SHx:    Reviewed / unchanged  Systems Review:  Constitutional: Denies fever, chills, wt changes, headaches, insomnia, fatigue, night sweats, change in appetite. Eyes: Denies redness, blurred vision, diplopia, discharge, itchy, watery eyes.  ENT: Denies discharge,  congestion, post nasal drip, epistaxis, sore throat, earache, hearing loss, dental pain, tinnitus, vertigo, sinus pain, snoring.  CV: Denies chest pain, palpitations, irregular heartbeat, syncope, dyspnea, diaphoresis, orthopnea, PND, claudication or edema. Respiratory: denies cough, dyspnea, DOE, pleurisy, hoarseness, laryngitis, wheezing.  Gastrointestinal: Denies dysphagia, odynophagia, heartburn, reflux, water brash, abdominal pain or cramps, nausea, vomiting, bloating, diarrhea, constipation, hematemesis, melena, hematochezia  or hemorrhoids. Genitourinary: Denies dysuria, frequency, urgency, nocturia, hesitancy, discharge, hematuria or flank pain. Musculoskeletal: Denies arthralgias, myalgias, stiffness, jt. swelling, pain, limping or strain/sprain.  Skin: Denies pruritus, rash, hives, warts, acne, eczema or change in skin lesion(s). Neuro: No weakness, tremor, incoordination, spasms, paresthesia or pain. Psychiatric: Denies confusion, memory loss or sensory loss. Endo: Denies change in weight, skin or hair change.  Heme/Lymph: No excessive bleeding, bruising or enlarged lymph nodes.  Physical Exam  BP 130/68   Pulse 80   Temp (!) 97.2 F (36.2 C)   Resp 18   Ht 5\' 11"  (1.803 m)   Wt 209 lb 6.4 oz (95 kg)   BMI 29.21 kg/m   Appears well nourished, well groomed  and in no distress.  Eyes: PERRLA, EOMs, conjunctiva no swelling or erythema. Sinuses: No frontal/maxillary tenderness ENT/Mouth: EAC's clear, TM's nl w/o erythema, bulging. Nares clear w/o erythema, swelling, exudates. Oropharynx clear without erythema or exudates. Oral hygiene is good. Tongue normal, non obstructing. Hearing intact.  Neck: Supple. Thyroid nl. Car 2+/2+ without bruits, nodes or JVD. Chest: Respirations nl with BS clear & equal w/o rales, rhonchi, wheezing or stridor.  Cor: Heart sounds normal w/ regular rate and rhythm without sig. murmurs, gallops, clicks or rubs. Peripheral pulses normal and equal   without edema.  Abdomen: Soft & bowel sounds normal. Non-tender w/o guarding, rebound, hernias, masses or organomegaly.  Lymphatics: Unremarkable.  Musculoskeletal: Full ROM all peripheral extremities, joint stability, 5/5 strength and normal gait.  Skin: Warm, dry without exposed rashes, lesions or ecchymosis apparent.  Neuro: Cranial nerves intact, reflexes equal bilaterally. Sensory-motor testing grossly intact. Tendon reflexes grossly intact.  Pysch: Alert & oriented x 3.  Insight and judgement nl & appropriate. No ideations.  Assessment and Plan:  1. Essential hypertension  - Continue medication, monitor blood pressure at home.  - Continue DASH diet. Reminder to go to the ER if any CP,  SOB, nausea, dizziness, severe HA, changes vision/speech.  2. Hyperlipidemia, mixed  - Continue diet/meds, exercise,& lifestyle modifications.  - Continue monitor periodic cholesterol/liver & renal functions   - rosuvastatin (CRESTOR) 40 MG tablet; Take 1/2 to 1 tablet daily or as directed for Cholesterol  Dispense: 90 tablet; Refill: 1  3. Diabetes mellitus due to underlying condition with stage 2 chronic kidney disease, without long-term current use of insulin ([place])  -  Continue diet, exercise, lifestyle modifications.  - Monitor appropriate labs.  4. Vitamin D deficiency  - Continue supplementation.  5. Seizure ([place])  6. Medication management        Discussed  regular exercise, BP monitoring, weight control to achieve/maintain BMI less than 25 and discussed med and SE's. Recommended labs to assess and monitor clinical status with further disposition pending results of labs. Over 30 minutes of exam, counseling, chart review was performed.

## 2017-11-05 NOTE — Patient Instructions (Signed)

## 2017-12-21 ENCOUNTER — Emergency Department (HOSPITAL_COMMUNITY): Payer: Medicare Other

## 2017-12-21 ENCOUNTER — Other Ambulatory Visit: Payer: Self-pay

## 2017-12-21 ENCOUNTER — Emergency Department (HOSPITAL_COMMUNITY)
Admission: EM | Admit: 2017-12-21 | Discharge: 2017-12-21 | Disposition: A | Discharge status: Home / Self Care | Payer: Medicare Other | Attending: Physician Assistant | Admitting: Physician Assistant

## 2017-12-21 ENCOUNTER — Encounter (HOSPITAL_COMMUNITY): Payer: Self-pay | Admitting: Emergency Medicine

## 2017-12-21 DIAGNOSIS — N182 Chronic kidney disease, stage 2 (mild): Secondary | ICD-10-CM | POA: Insufficient documentation

## 2017-12-21 DIAGNOSIS — R531 Weakness: Secondary | ICD-10-CM | POA: Diagnosis not present

## 2017-12-21 DIAGNOSIS — I129 Hypertensive chronic kidney disease with stage 1 through stage 4 chronic kidney disease, or unspecified chronic kidney disease: Secondary | ICD-10-CM | POA: Insufficient documentation

## 2017-12-21 DIAGNOSIS — J40 Bronchitis, not specified as acute or chronic: Secondary | ICD-10-CM | POA: Diagnosis not present

## 2017-12-21 DIAGNOSIS — R05 Cough: Secondary | ICD-10-CM | POA: Diagnosis not present

## 2017-12-21 DIAGNOSIS — E1122 Type 2 diabetes mellitus with diabetic chronic kidney disease: Secondary | ICD-10-CM | POA: Diagnosis not present

## 2017-12-21 DIAGNOSIS — R079 Chest pain, unspecified: Secondary | ICD-10-CM | POA: Diagnosis not present

## 2017-12-21 DIAGNOSIS — Z79899 Other long term (current) drug therapy: Secondary | ICD-10-CM | POA: Diagnosis not present

## 2017-12-21 DIAGNOSIS — R7989 Other specified abnormal findings of blood chemistry: Secondary | ICD-10-CM | POA: Insufficient documentation

## 2017-12-21 DIAGNOSIS — J449 Chronic obstructive pulmonary disease, unspecified: Secondary | ICD-10-CM | POA: Diagnosis not present

## 2017-12-21 DIAGNOSIS — J029 Acute pharyngitis, unspecified: Secondary | ICD-10-CM | POA: Diagnosis not present

## 2017-12-21 DIAGNOSIS — Z87891 Personal history of nicotine dependence: Secondary | ICD-10-CM | POA: Diagnosis not present

## 2017-12-21 DIAGNOSIS — Z7984 Long term (current) use of oral hypoglycemic drugs: Secondary | ICD-10-CM | POA: Insufficient documentation

## 2017-12-21 DIAGNOSIS — R740 Nonspecific elevation of levels of transaminase and lactic acid dehydrogenase [LDH]: Secondary | ICD-10-CM | POA: Diagnosis not present

## 2017-12-21 DIAGNOSIS — Z91040 Latex allergy status: Secondary | ICD-10-CM | POA: Insufficient documentation

## 2017-12-21 DIAGNOSIS — M6281 Muscle weakness (generalized): Secondary | ICD-10-CM | POA: Diagnosis present

## 2017-12-21 LAB — COMPREHENSIVE METABOLIC PANEL
ALBUMIN: 3.5 g/dL (ref 3.5–5.0)
ALK PHOS: 46 U/L (ref 38–126)
ALT: 45 U/L (ref 17–63)
ANION GAP: 10 (ref 5–15)
AST: 38 U/L (ref 15–41)
BILIRUBIN TOTAL: 0.7 mg/dL (ref 0.3–1.2)
BUN: 11 mg/dL (ref 6–20)
CALCIUM: 9.8 mg/dL (ref 8.9–10.3)
CO2: 25 mmol/L (ref 22–32)
Chloride: 102 mmol/L (ref 101–111)
Creatinine, Ser: 1.02 mg/dL (ref 0.61–1.24)
GFR calc Af Amer: >60 mL/min (ref >60)
GLUCOSE: 109 mg/dL — AB (ref 65–99)
POTASSIUM: 4.5 mmol/L (ref 3.5–5.1)
Sodium: 137 mmol/L (ref 135–145)
TOTAL PROTEIN: 6.8 g/dL (ref 6.5–8.1)

## 2017-12-21 LAB — CBC WITH DIFFERENTIAL/PLATELET
BASOS ABS: 0.1 10*3/uL (ref 0.0–0.1)
BASOS PCT: 1 %
Eosinophils Absolute: 1.7 10*3/uL — ABNORMAL HIGH (ref 0.0–0.7)
Eosinophils Relative: 16 %
HEMATOCRIT: 40.1 % (ref 39.0–52.0)
HEMOGLOBIN: 12.4 g/dL — AB (ref 13.0–17.0)
LYMPHS PCT: 33 %
Lymphs Abs: 3.6 10*3/uL (ref 0.7–4.0)
MCH: 28 pg (ref 26.0–34.0)
MCHC: 30.9 g/dL (ref 30.0–36.0)
MCV: 90.5 fL (ref 78.0–100.0)
Monocytes Absolute: 1 10*3/uL (ref 0.1–1.0)
Monocytes Relative: 9 %
NEUTROS ABS: 4.7 10*3/uL (ref 1.7–7.7)
NEUTROS PCT: 43 %
Platelets: 292 10*3/uL (ref 150–400)
RBC: 4.43 MIL/uL (ref 4.22–5.81)
RDW: 13.9 % (ref 11.5–15.5)
WBC: 11.1 10*3/uL — AB (ref 4.0–10.5)

## 2017-12-21 LAB — INFLUENZA PANEL BY PCR (TYPE A & B)
INFLAPCR: NEGATIVE
Influenza B By PCR: NEGATIVE

## 2017-12-21 LAB — URINALYSIS, ROUTINE W REFLEX MICROSCOPIC
Bacteria, UA: NONE SEEN
Bilirubin Urine: NEGATIVE
Glucose, UA: NEGATIVE mg/dL
Hgb urine dipstick: NEGATIVE
Ketones, ur: 5 mg/dL — AB
Leukocytes, UA: NEGATIVE
Nitrite: NEGATIVE
PH: 5 (ref 5.0–8.0)
PROTEIN: NEGATIVE mg/dL
RBC / HPF: NONE SEEN RBC/hpf (ref 0–5)
Specific Gravity, Urine: 1.016 (ref 1.005–1.030)

## 2017-12-21 LAB — I-STAT CG4 LACTIC ACID, ED
LACTIC ACID, VENOUS: 3.69 mmol/L — AB (ref 0.5–1.9)
LACTIC ACID, VENOUS: 2.69 mmol/L — AB (ref 0.5–1.9)
LACTIC ACID, VENOUS: 1.86 mmol/L (ref 0.5–1.9)

## 2017-12-21 LAB — I-STAT TROPONIN, ED: Troponin i, poc: 0 ng/mL (ref 0.00–0.08)

## 2017-12-21 MED ORDER — GUAIFENESIN 100 MG/5ML PO SYRP: 100.0000 mg | ORAL_SOLUTION | ORAL | 0 refills | Status: DC | PRN

## 2017-12-21 MED ORDER — SODIUM CHLORIDE 0.9 % IV BOLUS (SEPSIS)
1000.0000 mL | Freq: Once | INTRAVENOUS | Status: AC
  Administered 2017-12-21: 1000 mL via INTRAVENOUS

## 2017-12-21 MED ORDER — AZITHROMYCIN 250 MG PO TABS: 250.0000 mg | ORAL_TABLET | Freq: Every day | ORAL | 0 refills | Status: DC

## 2017-12-21 NOTE — ED Notes (Addendum)
Provider at bedside at this time

## 2017-12-21 NOTE — ED Notes (Signed)
Nurse will start IV and get 2nd set blood culture. 

## 2017-12-21 NOTE — ED Provider Notes (Signed)
[facility]PART[facility] Provider Note   CSN: [id number] Arrival date & time: 12/21/17  0359     History   Chief Complaint Chief Complaint  Patient presents with  . Weakness    HPI Trevor [name] is a 72 y.o. male.  HPI Trevor [name] is a 72 y.o. male with history of COPD, diabetes, hypertension, seizures, presents to emergency department with complaint of shaking, weakness, dry mouth.  Patient states he has had cold symptoms for the last 2 weeks.  He reports cough, congestion, sore throat.  States he has been taking Mucinex which has been helping him.  He feels like cough may be improving.  Today he was watching television around 3 AM when he started shaking in his arms and legs.  He states that he  woke his wife up who brought him to emergency department.  Patient denies any pain at this time.  He is unsure for how long the shaking lasted, but now it subsided.  He denies any complaints.  Denies any chest pain, no shortness of breath, no abdominal pain.  Denies any nausea or vomiting.  No diarrhea no treatment prior to coming in this morning.  Past Medical History:  Diagnosis Date  . BPH (benign prostatic hyperplasia)   . COPD (chronic obstructive pulmonary disease) ([place])   . Diabetes mellitus without complication ([place])   . GERD (gastroesophageal reflux disease)   . Hypercholesterolemia   . Hypertension   . Seizures ([place])   . Type II or unspecified type diabetes mellitus without mention of complication, not stated as uncontrolled     Patient Active Problem List   Diagnosis Date Noted  . Vitamin D deficiency 10/08/2017  . COPD (chronic obstructive pulmonary disease) ([place]) 07/08/2017  . Morbid obesity ([place]) 12/22/2015  . Encounter for Medicare annual wellness exam 12/22/2015  . Hyperlipidemia, mixed   . Hypertension   . Diabetes mellitus due to underlying condition with stage 2 chronic kidney disease ([place])   . Seizures ([place])   . BPH (benign prostatic  hyperplasia)   . GERD (gastroesophageal reflux disease)     Past Surgical History:  Procedure Laterality Date  . HAND SURGERY Left 1953  . KNEE ARTHROSCOPY Left 1998       Home Medications    Prior to Admission medications   Medication Sig Start Date End Date Taking? Authorizing Provider  albuterol (VENTOLIN HFA) 108 (90 Base) MCG/ACT inhaler Inhale 2 puffs into the lungs every 4 (four) hours as needed for wheezing or shortness of breath. 12/22/15   Vicie Mutters, PA-C  aspirin 81 MG chewable tablet Chew 81 mg by mouth daily.    [provider]  atenolol (TENORMIN) 100 MG tablet Take 100 mg by mouth daily.    [provider]  divalproex (DEPAKOTE ER) 500 MG 24 hr tablet Take 1 tablet (500 mg total) by mouth 2 (two) times daily. 10/11/17   Unk Pinto, MD  finasteride (PROSCAR) 5 MG tablet Take 5 mg by mouth every evening.    [provider]  fluticasone (FLONASE) 50 MCG/ACT nasal spray Place 2 sprays into both nostrils daily. 12/05/15   Forcucci, Courtney, PA-C  glipiZIDE (GLUCOTROL) 10 MG tablet Take 10 mg by mouth 2 (two) times daily before a meal.    [provider]  lisinopril (PRINIVIL,ZESTRIL) 40 MG tablet Take 40 mg by mouth daily.    [provider]  MAGNESIUM PO Take 420 mg by mouth 2 (two) times daily.  [provider]  metFORMIN (GLUCOPHAGE XR) 500 MG 24 hr tablet Take 2 tablets 2 x / day for Diabetes 10/02/17 10/02/18  Unk Pinto, MD  montelukast (SINGULAIR) 10 MG tablet Take 1 tablet daily for Allergies & Asthma 10/02/17 10/02/18  Unk Pinto, MD  Multiple Vitamin (MULTIVITAMIN) tablet Take 1 tablet by mouth daily.    [provider]  Multiple Vitamins-Minerals (ICAPS AREDS FORMULA PO) Take 1 tablet by mouth daily.    [provider]  rosuvastatin (CRESTOR) 40 MG tablet Take 1/2 to 1 tablet daily or as directed for Cholesterol 11/05/17   Unk Pinto, MD    Family  History Family History  Problem Relation Age of Onset  . CVA Mother   . Thyroid disease Mother   . Heart disease Father   . Diabetes Sister   . Heart disease Sister   . Diabetes Brother   . Heart disease Brother   . Epilepsy Brother   . Schizophrenia Brother     Social History Social History   Tobacco Use  . Smoking status: Former Smoker    Last attempt to quit: 12/17/2013    Years since quitting: 4.0  . Smokeless tobacco: Never Used  Substance Use Topics  . Alcohol use: No    Alcohol/week: 0.0 oz  . Drug use: No     Allergies   Penicillins and Latex   Review of Systems Review of Systems  Constitutional: Positive for chills. Negative for fever.  HENT: Positive for congestion and sore throat.   Respiratory: Positive for cough. Negative for chest tightness and shortness of breath.   Cardiovascular: Negative for chest pain, palpitations and leg swelling.  Gastrointestinal: Negative for abdominal distention, abdominal pain, diarrhea, nausea and vomiting.  Genitourinary: Negative for dysuria, frequency, hematuria and urgency.  Musculoskeletal: Negative for arthralgias, myalgias, neck pain and neck stiffness.  Skin: Negative for rash.  Allergic/Immunologic: Negative for immunocompromised state.  Neurological: Positive for weakness. Negative for dizziness, light-headedness, numbness and headaches.  All other systems reviewed and are negative.    Physical Exam Updated Vital Signs BP (!) 167/77 (BP Location: Right Arm)   Pulse 82   Temp 98.9 F (37.2 C) (Oral)   Resp 18   Ht 5\' 11"  (1.803 m)   Wt 95.3 kg (210 lb)   SpO2 96%   BMI 29.29 kg/m   Physical Exam  Constitutional: He appears well-developed and well-nourished. No distress.  HENT:  Head: Normocephalic and atraumatic.  Right Ear: External ear normal.  Left Ear: External ear normal.  Nose: Nose normal.  Mouth/Throat: Oropharynx is clear and moist.  Eyes: Conjunctivae and EOM are normal. Pupils are  equal, round, and reactive to light.  Neck: Normal range of motion. Neck supple.  No rigidity  Cardiovascular: Normal rate, regular rhythm and normal heart sounds.  Pulmonary/Chest: Effort normal. No respiratory distress. He has no wheezes. He has no rales.  Abdominal: Soft. Bowel sounds are normal. He exhibits no distension. There is no tenderness. There is no rebound.  Musculoskeletal: He exhibits no edema.  Neurological: He is alert.  Skin: Skin is warm and dry.  Nursing note and vitals reviewed.    ED Treatments / Results  Labs (all labs ordered are listed, but only abnormal results are displayed) Labs Reviewed  COMPREHENSIVE METABOLIC PANEL - Abnormal; Notable for the following components:      Result Value   Glucose, Bld 109 (*)    All other components within normal limits  CBC WITH DIFFERENTIAL/PLATELET -  Abnormal; Notable for the following components:   WBC 11.1 (*)    Hemoglobin 12.4 (*)    Eosinophils Absolute 1.7 (*)    All other components within normal limits  URINALYSIS, ROUTINE W REFLEX MICROSCOPIC - Abnormal; Notable for the following components:   Ketones, ur 5 (*)    Squamous Epithelial / LPF 0-5 (*)    All other components within normal limits  I-STAT CG4 LACTIC ACID, ED - Abnormal; Notable for the following components:   Lactic Acid, Venous 3.69 (*)    All other components within normal limits  CULTURE, BLOOD (ROUTINE X 2)  CULTURE, BLOOD (ROUTINE X 2)  INFLUENZA PANEL BY PCR (TYPE A & B)  I-STAT TROPONIN, ED  I-STAT CG4 LACTIC ACID, ED    EKG  EKG Interpretation None       Radiology Dg Chest 2 View  Result Date: 12/21/2017 CLINICAL DATA:  Initial evaluation for [place] chest pain. EXAM: CHEST  2 VIEW COMPARISON:  Prior radiograph from 08/29/2017. FINDINGS: The cardiac and mediastinal silhouettes are stable in size and contour, and remain within normal limits. Aortic atherosclerosis. The lungs are normally inflated. No airspace consolidation,  pleural effusion, or pulmonary edema is identified. There is no pneumothorax. No acute osseous abnormality identified. IMPRESSION: No active cardiopulmonary disease. Electronically Signed   By: Jeannine Boga M.D.   On: 12/21/2017 04:51    Procedures Procedures (including critical care time)  Medications Ordered in ED Medications  sodium chloride 0.9 % bolus 1,000 mL (not administered)     Initial Impression / Assessment and Plan / ED Course  I have reviewed the triage vital signs and the nursing notes.  Pertinent labs & imaging results that were available during my care of the patient were reviewed by me and considered in my medical decision making (see chart for details).     Pt in emergency department with shaking episode.  Afebrile here. Has been batling a "cold" reports bad cough. Will check labs, CXR, influenza panel, will check rectal temp. VS normal other than htn.    10:17 AM Initial lactic acid 3.69, white blood cell count 11.1, otherwise unremarkable labs.  Patient hydrated with 2 L of IV fluids.  Recheck lactic acid is normal at 1.86.  Patient states he feels well.  He is back to normal.  He still coughing.  He has had a cough for 2 weeks, which seems not to be improving, will put him on Zithromax, continue Robitussin and Mucinex at home.  At this time patient stable for discharge home.  Advised to follow-up with family doctor for recheck in 2-3 days.  Return precautions discussed.  Vitals:   12/21/17 1015 12/21/17 1030 12/21/17 1045 12/21/17 1058  BP: (!) 157/70 (!) 156/66 (!) 155/75   Pulse: 72 70 67   Resp: 18 15 15    Temp:    97.7 F (36.5 C)  TempSrc:    Oral  SpO2: 93% 93% 93%   Weight:      Height:         Final Clinical Impressions(s) / ED Diagnoses   Final diagnoses:  Generalized weakness  Elevated lactic acid level  Bronchitis    ED Discharge Orders        Ordered    azithromycin (ZITHROMAX) 250 MG tablet  Daily     12/21/17 1019     guaifenesin (ROBITUSSIN) 100 MG/5ML syrup  Every 4 hours PRN     12/21/17 1019  Jeannett Senior, PA-C 12/21/17 1218    Jeannett Senior, PA-C 12/21/17 1244    Macarthur Critchley, MD 12/22/17 [phone]

## 2017-12-21 NOTE — ED Triage Notes (Signed)
Pt reports having flu like /S X2-3 days, cough, body aches, chills. Pt reports he was feeling weak this AM and started "shaking" Pt has taken OTC meds (Sudafed, Mucinex) which has helped

## 2017-12-21 NOTE — ED Notes (Signed)
ED Provider at bedside. 

## 2017-12-21 NOTE — Discharge Instructions (Signed)
Take zithromax as prescribed until all gone for upper respiratory infection. Drink plenty of fluids. Rest. Robitussin for cough as needed. Follow up with family doctor next week.

## 2017-12-26 LAB — CULTURE, BLOOD (ROUTINE X 2)
CULTURE: NO GROWTH
Culture: NO GROWTH
SPECIAL REQUESTS: ADEQUATE
SPECIAL REQUESTS: ADEQUATE

## 2018-01-18 ENCOUNTER — Ambulatory Visit (HOSPITAL_COMMUNITY)
Admission: EM | Admit: 2018-01-18 | Discharge: 2018-01-18 | Disposition: A | Discharge status: Home / Self Care | Payer: Medicare Other | Attending: Family Medicine | Admitting: Family Medicine

## 2018-01-18 ENCOUNTER — Other Ambulatory Visit: Payer: Self-pay

## 2018-01-18 ENCOUNTER — Encounter (HOSPITAL_COMMUNITY): Payer: Self-pay | Admitting: Emergency Medicine

## 2018-01-18 DIAGNOSIS — R062 Wheezing: Secondary | ICD-10-CM | POA: Diagnosis not present

## 2018-01-18 DIAGNOSIS — R0602 Shortness of breath: Secondary | ICD-10-CM | POA: Diagnosis not present

## 2018-01-18 DIAGNOSIS — J441 Chronic obstructive pulmonary disease with (acute) exacerbation: Secondary | ICD-10-CM | POA: Diagnosis not present

## 2018-01-18 MED ORDER — METHYLPREDNISOLONE SODIUM SUCC 125 MG IJ SOLR
125.0000 mg | Freq: Once | INTRAMUSCULAR | Status: AC
  Administered 2018-01-18: 125 mg via INTRAMUSCULAR

## 2018-01-18 MED ORDER — METHYLPREDNISOLONE SODIUM SUCC 125 MG IJ SOLR
INTRAMUSCULAR | Status: AC
  Filled 2018-01-18: qty 2

## 2018-01-18 MED ORDER — ALBUTEROL SULFATE HFA 108 (90 BASE) MCG/ACT IN AERS: 1.0000 | INHALATION_SPRAY | Freq: Four times a day (QID) | RESPIRATORY_TRACT | 0 refills | Status: DC | PRN

## 2018-01-18 MED ORDER — ALBUTEROL SULFATE (2.5 MG/3ML) 0.083% IN NEBU
INHALATION_SOLUTION | RESPIRATORY_TRACT | Status: AC
  Filled 2018-01-18: qty 3

## 2018-01-18 MED ORDER — CLARITHROMYCIN 500 MG PO TABS: 500.0000 mg | ORAL_TABLET | Freq: Two times a day (BID) | ORAL | 0 refills | Status: DC

## 2018-01-18 MED ORDER — PREDNISONE 10 MG PO TABS: ORAL_TABLET | ORAL | 0 refills | Status: DC

## 2018-01-18 MED ORDER — ALBUTEROL SULFATE (2.5 MG/3ML) 0.083% IN NEBU
2.5000 mg | INHALATION_SOLUTION | Freq: Once | RESPIRATORY_TRACT | Status: AC
  Administered 2018-01-18: 2.5 mg via RESPIRATORY_TRACT

## 2018-01-18 MED ORDER — BENZONATATE 100 MG PO CAPS: 200.0000 mg | ORAL_CAPSULE | Freq: Three times a day (TID) | ORAL | 0 refills | Status: DC | PRN

## 2018-01-18 NOTE — ED Provider Notes (Signed)
[facility]   [id number] 01/18/18 Arrival Time: 1210  ASSESSMENT & PLAN:  1. COPD exacerbation ([place])   2. SOB (shortness of breath)     Meds ordered this encounter  Medications  . albuterol (PROVENTIL) (2.5 MG/3ML) 0.083% nebulizer solution 2.5 mg  . methylPREDNISolone sodium succinate (SOLU-MEDROL) 125 mg/2 mL injection 125 mg  . clarithromycin (BIAXIN) 500 MG tablet    Sig: Take 1 tablet (500 mg total) by mouth 2 (two) times daily.    Dispense:  20 tablet    Refill:  0  . albuterol (PROVENTIL HFA;VENTOLIN HFA) 108 (90 Base) MCG/ACT inhaler    Sig: Inhale 1-2 puffs into the lungs every 6 (six) hours as needed for wheezing or shortness of breath.    Dispense:  1 Inhaler    Refill:  0  . predniSONE (DELTASONE) 10 MG tablet    Sig: Take 4 po qd x 3 days then 3 po qd x 3 days then 2 po qd x 3 days then 1 po qd x 3 days then stop    Dispense:  30 tablet    Refill:  0  . DISCONTD: benzonatate (TESSALON) 100 MG capsule    Sig: Take 2 capsules (200 mg total) by mouth 3 (three) times daily as needed for cough.    Dispense:  21 capsule    Refill:  0  . benzonatate (TESSALON) 100 MG capsule    Sig: Take 2 capsules (200 mg total) by mouth 3 (three) times daily as needed for cough.    Dispense:  21 capsule    Refill:  0    Reviewed expectations re: course of current medical issues. Questions answered. Outlined signs and symptoms indicating need for more acute intervention. Patient verbalized understanding. After Visit Summary given.   SUBJECTIVE: History from: patient. Trevor [name] is a 72 y.o. male who presents with complaint of persistent SOB wheezing and COPD sx's. Reports abrupt onset several weeks ago. Described symptoms have gradually worsened since beginning.  ROS: As per HPI.   OBJECTIVE:  Vitals:   01/18/18 1305  BP: (!) 146/76  Pulse: 77  Resp: 18  Temp: 98.5 F (36.9 C)  SpO2: 96%    General appearance: alert; no distress Eyes: PERRLA; EOMI;  conjunctiva normal HENT: normocephalic; atraumatic; TMs normal; nasal mucosa normal; oral mucosa normal Neck: supple  Lungs: expiratory wheezes throughout bilateral Heart: regular rate and rhythm Abdomen: soft, non-tender; bowel sounds normal; no masses or organomegaly; no guarding or rebound tenderness Back: no CVA tenderness Extremities: no cyanosis or edema; symmetrical with no gross deformities Skin: warm and dry Neurologic: normal gait; normal symmetric reflexes Psychological: alert and cooperative; normal mood and affect  Labs:  Labs Reviewed - No data to display  Imaging: No results found.  Allergies  Allergen Reactions  . Penicillins Other (See Comments)    Unknown reaction  . Latex Hives and Rash    Past Medical History:  Diagnosis Date  . BPH (benign prostatic hyperplasia)   . COPD (chronic obstructive pulmonary disease) ([place])   . Diabetes mellitus without complication ([place])   . GERD (gastroesophageal reflux disease)   . Hypercholesterolemia   . Hypertension   . Seizures ([place])   . Type II or unspecified type diabetes mellitus without mention of complication, not stated as uncontrolled    Social History   Socioeconomic History  . Marital status: Married    Spouse name: Not on file  . Number of children: Not on file  .  Years of education: Not on file  . Highest education level: Not on file  Social Needs  . Financial resource strain: Not on file  . Food insecurity - worry: Not on file  . Food insecurity - inability: Not on file  . Transportation needs - medical: Not on file  . Transportation needs - non-medical: Not on file  Occupational History  . Not on file  Tobacco Use  . Smoking status: Former Smoker    Last attempt to quit: 12/17/2013    Years since quitting: 4.0  . Smokeless tobacco: Never Used  Substance and Sexual Activity  . Alcohol use: No    Alcohol/week: 0.0 oz  . Drug use: No  . Sexual activity: Not on file  Other Topics Concern  .  Not on file  Social History Narrative  . Not on file   Family History  Problem Relation Age of Onset  . CVA Mother   . Thyroid disease Mother   . Heart disease Father   . Diabetes Sister   . Heart disease Sister   . Diabetes Brother   . Heart disease Brother   . Epilepsy Brother   . Schizophrenia Brother    Past Surgical History:  Procedure Laterality Date  . HAND SURGERY Left 1953  . KNEE ARTHROSCOPY Left 1998     Lysbeth Penner, [place] 01/18/18 1359

## 2018-01-18 NOTE — ED Triage Notes (Signed)
Pt c/o sob wheezing, ran out of his inhaler. Pt has audible wheezing. Feels SOB.

## 2018-02-10 ENCOUNTER — Ambulatory Visit: Payer: Self-pay | Admitting: Internal Medicine

## 2018-03-01 ENCOUNTER — Ambulatory Visit (HOSPITAL_COMMUNITY)
Admission: EM | Admit: 2018-03-01 | Discharge: 2018-03-01 | Disposition: A | Discharge status: Home / Self Care | Payer: Medicare Other | Attending: Family Medicine | Admitting: Family Medicine

## 2018-03-01 ENCOUNTER — Encounter (HOSPITAL_COMMUNITY): Payer: Self-pay | Admitting: Emergency Medicine

## 2018-03-01 ENCOUNTER — Other Ambulatory Visit: Payer: Self-pay

## 2018-03-01 DIAGNOSIS — R0602 Shortness of breath: Secondary | ICD-10-CM | POA: Diagnosis not present

## 2018-03-01 DIAGNOSIS — J441 Chronic obstructive pulmonary disease with (acute) exacerbation: Secondary | ICD-10-CM

## 2018-03-01 DIAGNOSIS — R062 Wheezing: Secondary | ICD-10-CM | POA: Diagnosis not present

## 2018-03-01 MED ORDER — AZITHROMYCIN 250 MG PO TABS
ORAL_TABLET | ORAL | Status: AC
  Filled 2018-03-01: qty 4

## 2018-03-01 MED ORDER — IPRATROPIUM-ALBUTEROL 0.5-2.5 (3) MG/3ML IN SOLN
RESPIRATORY_TRACT | Status: AC
  Filled 2018-03-01: qty 3

## 2018-03-01 MED ORDER — AZITHROMYCIN 250 MG PO TABS
1000.0000 mg | ORAL_TABLET | Freq: Once | ORAL | Status: AC
  Administered 2018-03-01: 1000 mg via ORAL

## 2018-03-01 MED ORDER — METHYLPREDNISOLONE ACETATE 80 MG/ML IJ SUSP
INTRAMUSCULAR | Status: AC
  Filled 2018-03-01: qty 1

## 2018-03-01 MED ORDER — HYDROCODONE-HOMATROPINE 5-1.5 MG/5ML PO SYRP: 5.0000 mL | ORAL_SOLUTION | Freq: Four times a day (QID) | ORAL | 0 refills | Status: DC | PRN

## 2018-03-01 MED ORDER — PREDNISONE 20 MG PO TABS: ORAL_TABLET | ORAL | 0 refills | Status: DC

## 2018-03-01 MED ORDER — IPRATROPIUM-ALBUTEROL 0.5-2.5 (3) MG/3ML IN SOLN
3.0000 mL | Freq: Once | RESPIRATORY_TRACT | Status: AC
  Administered 2018-03-01: 3 mL via RESPIRATORY_TRACT

## 2018-03-01 MED ORDER — METHYLPREDNISOLONE ACETATE 80 MG/ML IJ SUSP
80.0000 mg | Freq: Once | INTRAMUSCULAR | Status: AC
  Administered 2018-03-01: 80 mg via INTRAMUSCULAR

## 2018-03-01 NOTE — ED Triage Notes (Addendum)
Sob for a week, worse today.  Initial head congestion, then drainage, phlegm went to throat.  Patient is sop/wheezing  Patient has been like this before per patient and family member

## 2018-03-01 NOTE — ED Provider Notes (Signed)
[facility]   [id number] 03/01/18 Arrival Time: 1933   SUBJECTIVE:  Trevor [name] is a 72 y.o. male who presents to the urgent care with complaint of Sob for a week, worse today.  Initial head congestion, then drainage, phlegm went to throat.  Patient is sop/wheezing  Patient has been like this before per patient and family member  Past Medical History:  Diagnosis Date  . BPH (benign prostatic hyperplasia)   . COPD (chronic obstructive pulmonary disease) ([place])   . Diabetes mellitus without complication ([place])   . GERD (gastroesophageal reflux disease)   . Hypercholesterolemia   . Hypertension   . Seizures ([place])   . Type II or unspecified type diabetes mellitus without mention of complication, not stated as uncontrolled    Family History  Problem Relation Age of Onset  . CVA Mother   . Thyroid disease Mother   . Heart disease Father   . Diabetes Sister   . Heart disease Sister   . Diabetes Brother   . Heart disease Brother   . Epilepsy Brother   . Schizophrenia Brother    Social History   Socioeconomic History  . Marital status: Married    Spouse name: Not on file  . Number of children: Not on file  . Years of education: Not on file  . Highest education level: Not on file  Social Needs  . Financial resource strain: Not on file  . Food insecurity - worry: Not on file  . Food insecurity - inability: Not on file  . Transportation needs - medical: Not on file  . Transportation needs - non-medical: Not on file  Occupational History  . Not on file  Tobacco Use  . Smoking status: Former Smoker    Last attempt to quit: 12/17/2013    Years since quitting: 4.2  . Smokeless tobacco: Never Used  Substance and Sexual Activity  . Alcohol use: No    Alcohol/week: 0.0 oz  . Drug use: No  . Sexual activity: Not on file  Other Topics Concern  . Not on file  Social History Narrative  . Not on file   No outpatient medications have been marked as taking for the  03/01/18 encounter [facility] Encounter).   Allergies  Allergen Reactions  . Penicillins Other (See Comments)    Unknown reaction  . Latex Hives and Rash      ROS: As per HPI, remainder of ROS negative.   OBJECTIVE:   Vitals:   03/01/18 2022 03/01/18 2030  BP: (!) 166/83   Pulse: 79   Resp: (!) 24   Temp: 98.4 F (36.9 C)   TempSrc: Oral   SpO2: 94% 94%     General appearance: alert; mild respiratory distress Eyes: PERRL; EOMI; conjunctiva normal HENT: normocephalic; atraumatic; TMs normal, canal normal, external ears normal without trauma; nasal mucosa normal; oral mucosa normal Neck: supple Lungs: clear to auscultation bilaterally Heart: diffuse exp wheezes bilaterally Back: no CVA tenderness Extremities: no cyanosis or edema; symmetrical with no gross deformities Skin: warm and dry Neurologic: normal gait; grossly normal Psychological: alert and cooperative; normal mood and affect      Labs:  Results for orders placed or performed during the hospital encounter of 12/21/17  Blood culture (routine x 2)  Result Value Ref Range   Specimen Description BLOOD LEFT ANTECUBITAL    Special Requests      BOTTLES DRAWN AEROBIC AND ANAEROBIC Blood Culture adequate volume   Culture NO GROWTH 5 DAYS  Report Status 12/26/2017 FINAL   Blood culture (routine x 2)  Result Value Ref Range   Specimen Description BLOOD RIGHT ANTECUBITAL    Special Requests IN PEDIATRIC BOTTLE Blood Culture adequate volume    Culture NO GROWTH 5 DAYS    Report Status 12/26/2017 FINAL   Comprehensive metabolic panel  Result Value Ref Range   Sodium 137 135 - 145 mmol/L   Potassium 4.5 3.5 - 5.1 mmol/L   Chloride 102 101 - 111 mmol/L   CO2 25 22 - 32 mmol/L   Glucose, Bld 109 (H) 65 - 99 mg/dL   BUN 11 6 - 20 mg/dL   Creatinine, Ser 1.02 0.61 - 1.24 mg/dL   Calcium 9.8 8.9 - 10.3 mg/dL   Total Protein 6.8 6.5 - 8.1 g/dL   Albumin 3.5 3.5 - 5.0 g/dL   AST 38 15 - 41 U/L   ALT 45 17 -  63 U/L   Alkaline Phosphatase 46 38 - 126 U/L   Total Bilirubin 0.7 0.3 - 1.2 mg/dL   GFR calc non Af Amer >60 >60 mL/min   GFR calc Af Amer >60 >60 mL/min   Anion gap 10 5 - 15  CBC with Differential  Result Value Ref Range   WBC 11.1 (H) 4.0 - 10.5 K/uL   RBC 4.43 4.22 - 5.81 MIL/uL   Hemoglobin 12.4 (L) 13.0 - 17.0 g/dL   HCT 40.1 39.0 - 52.0 %   MCV 90.5 78.0 - 100.0 fL   MCH 28.0 26.0 - 34.0 pg   MCHC 30.9 30.0 - 36.0 g/dL   RDW 13.9 11.5 - 15.5 %   Platelets 292 150 - 400 K/uL   Neutrophils Relative % 43 %   Neutro Abs 4.7 1.7 - 7.7 K/uL   Lymphocytes Relative 33 %   Lymphs Abs 3.6 0.7 - 4.0 K/uL   Monocytes Relative 9 %   Monocytes Absolute 1.0 0.1 - 1.0 K/uL   Eosinophils Relative 16 %   Eosinophils Absolute 1.7 (H) 0.0 - 0.7 K/uL   Basophils Relative 1 %   Basophils Absolute 0.1 0.0 - 0.1 K/uL  Urinalysis, Routine w reflex microscopic  Result Value Ref Range   Color, Urine YELLOW YELLOW   APPearance CLEAR CLEAR   Specific Gravity, Urine 1.016 1.005 - 1.030   pH 5.0 5.0 - 8.0   Glucose, UA NEGATIVE NEGATIVE mg/dL   Hgb urine dipstick NEGATIVE NEGATIVE   Bilirubin Urine NEGATIVE NEGATIVE   Ketones, ur 5 (A) NEGATIVE mg/dL   Protein, ur NEGATIVE NEGATIVE mg/dL   Nitrite NEGATIVE NEGATIVE   Leukocytes, UA NEGATIVE NEGATIVE   RBC / HPF NONE SEEN 0 - 5 RBC/hpf   WBC, UA 0-5 0 - 5 WBC/hpf   Bacteria, UA NONE SEEN NONE SEEN   Squamous Epithelial / LPF 0-5 (A) NONE SEEN   Mucus PRESENT   Influenza panel by PCR (type A & B)  Result Value Ref Range   Influenza A By PCR NEGATIVE NEGATIVE   Influenza B By PCR NEGATIVE NEGATIVE  I-Stat CG4 Lactic Acid, ED  Result Value Ref Range   Lactic Acid, Venous 3.69 (HH) 0.5 - 1.9 mmol/L   Comment NOTIFIED PHYSICIAN   I-Stat Troponin, ED (not at [facility])  Result Value Ref Range   Troponin i, poc 0.00 0.00 - 0.08 ng/mL   Comment 3          I-Stat CG4 Lactic Acid, ED  Result Value Ref Range   Lactic Acid,  Venous 2.69 (HH) 0.5 -  1.9 mmol/L  I-Stat CG4 Lactic Acid, ED  Result Value Ref Range   Lactic Acid, Venous 1.86 0.5 - 1.9 mmol/L    Labs Reviewed - No data to display  No results found.     ASSESSMENT & PLAN:  1. COPD exacerbation ([place])     Meds ordered this encounter  Medications  . ipratropium-albuterol (DUONEB) 0.5-2.5 (3) MG/3ML nebulizer solution 3 mL  . methylPREDNISolone acetate (DEPO-MEDROL) injection 80 mg  . azithromycin (ZITHROMAX) tablet 1,000 mg  . HYDROcodone-homatropine (HYDROMET) 5-1.5 MG/5ML syrup    Sig: Take 5 mLs by mouth every 6 (six) hours as needed for cough.    Dispense:  100 mL    Refill:  0  . predniSONE (DELTASONE) 20 MG tablet    Sig: Two daily with food    Dispense:  10 tablet    Refill:  0    Reviewed expectations re: course of current medical issues. Questions answered. Outlined signs and symptoms indicating need for more acute intervention. Patient verbalized understanding. After Visit Summary given.     Robyn Haber, MD 03/01/18 2105

## 2018-04-19 ENCOUNTER — Ambulatory Visit (HOSPITAL_COMMUNITY)
Admission: EM | Admit: 2018-04-19 | Discharge: 2018-04-19 | Disposition: A | Discharge status: Home / Self Care | Payer: Medicare Other | Attending: Family Medicine | Admitting: Family Medicine

## 2018-04-19 ENCOUNTER — Encounter (HOSPITAL_COMMUNITY): Payer: Self-pay | Admitting: Emergency Medicine

## 2018-04-19 DIAGNOSIS — J441 Chronic obstructive pulmonary disease with (acute) exacerbation: Secondary | ICD-10-CM | POA: Diagnosis not present

## 2018-04-19 DIAGNOSIS — R0602 Shortness of breath: Secondary | ICD-10-CM

## 2018-04-19 MED ORDER — IPRATROPIUM-ALBUTEROL 0.5-2.5 (3) MG/3ML IN SOLN
RESPIRATORY_TRACT | Status: AC
  Filled 2018-04-19: qty 3

## 2018-04-19 MED ORDER — PREDNISONE 20 MG PO TABS: ORAL_TABLET | ORAL | 0 refills | Status: DC

## 2018-04-19 MED ORDER — METHYLPREDNISOLONE ACETATE 80 MG/ML IJ SUSP
80.0000 mg | Freq: Once | INTRAMUSCULAR | Status: AC
  Administered 2018-04-19: 80 mg via INTRAMUSCULAR

## 2018-04-19 MED ORDER — ALBUTEROL SULFATE HFA 108 (90 BASE) MCG/ACT IN AERS: 1.0000 | INHALATION_SPRAY | Freq: Four times a day (QID) | RESPIRATORY_TRACT | 11 refills | Status: DC | PRN

## 2018-04-19 MED ORDER — METHYLPREDNISOLONE ACETATE 80 MG/ML IJ SUSP
INTRAMUSCULAR | Status: AC
  Filled 2018-04-19: qty 1

## 2018-04-19 MED ORDER — IPRATROPIUM-ALBUTEROL 0.5-2.5 (3) MG/3ML IN SOLN
3.0000 mL | Freq: Once | RESPIRATORY_TRACT | Status: AC
  Administered 2018-04-19: 3 mL via RESPIRATORY_TRACT

## 2018-04-19 NOTE — ED Triage Notes (Signed)
Pt c/o sob, states hes not sure if its his asthma, copd, or allergies. C/o breathing issues x1 week. Using nasal spray, inhaler without relief.

## 2018-04-19 NOTE — Discharge Instructions (Signed)
Ask your primary provider at the [facility] to perform a yearly CAT scan given your smoking history

## 2018-04-19 NOTE — ED Provider Notes (Signed)
[place]   [id number] 04/19/18 Arrival Time: 1159   SUBJECTIVE:  Trevor [name] is a 72 y.o. male who presents to the urgent care with complaint of sob, states hes not sure if its his asthma, copd, or allergies. C/o breathing issues x1 week. Using nasal spray, inhaler without relief.      Past Medical History:  Diagnosis Date  . BPH (benign prostatic hyperplasia)   . COPD (chronic obstructive pulmonary disease) ([place])   . Diabetes mellitus without complication ([place])   . GERD (gastroesophageal reflux disease)   . Hypercholesterolemia   . Hypertension   . Seizures ([place])   . Type II or unspecified type diabetes mellitus without mention of complication, not stated as uncontrolled    Family History  Problem Relation Age of Onset  . CVA Mother   . Thyroid disease Mother   . Heart disease Father   . Diabetes Sister   . Heart disease Sister   . Diabetes Brother   . Heart disease Brother   . Epilepsy Brother   . Schizophrenia Brother    Social History   Socioeconomic History  . Marital status: Married    Spouse name: Not on file  . Number of children: Not on file  . Years of education: Not on file  . Highest education level: Not on file  Occupational History  . Not on file  Social Needs  . Financial resource strain: Not on file  . Food insecurity:    Worry: Not on file    Inability: Not on file  . Transportation needs:    Medical: Not on file    Non-medical: Not on file  Tobacco Use  . Smoking status: Former Smoker    Last attempt to quit: 12/17/2013    Years since quitting: 4.3  . Smokeless tobacco: Never Used  Substance and Sexual Activity  . Alcohol use: No    Alcohol/week: 0.0 oz  . Drug use: No  . Sexual activity: Not on file  Lifestyle  . Physical activity:    Days per week: Not on file    Minutes per session: Not on file  . Stress: Not on file  Relationships  . Social connections:    Talks on phone: Not on file    Gets together: Not on  file    Attends religious service: Not on file    Active member of club or organization: Not on file    Attends meetings of clubs or organizations: Not on file    Relationship status: Not on file  . Intimate partner violence:    Fear of current or ex partner: Not on file    Emotionally abused: Not on file    Physically abused: Not on file    Forced sexual activity: Not on file  Other Topics Concern  . Not on file  Social History Narrative  . Not on file   No outpatient medications have been marked as taking for the 04/19/18 encounter [facility] Encounter).   Allergies  Allergen Reactions  . Penicillins Other (See Comments)    Unknown reaction  . Latex Hives and Rash      ROS: As per HPI, remainder of ROS negative.   OBJECTIVE:   Vitals:   04/19/18 1211  BP: 127/73  Pulse: 80  Resp: 18  Temp: 98.1 F (36.7 C)  SpO2: 95%     General appearance: alert; mild respiratory distress Eyes: PERRL; EOMI; conjunctiva normal HENT: normocephalic; atraumatic;  canal normal,  external ears normal without trauma; nasal mucosa normal; oral mucosa normal Neck: supple Lungs: very tight with faint wheezes on auscultation bilaterally Heart: regular rate and rhythm Abdomen: soft, non-tender; bowel sounds normal; no masses or organomegaly; no guarding or rebound tenderness Back: no CVA tenderness Extremities: no cyanosis or edema; symmetrical with no gross deformities Skin: warm and dry Neurologic: normal gait; grossly normal Psychological: alert and cooperative; normal mood and affect      Labs:  Results for orders placed or performed during the hospital encounter of 12/21/17  Blood culture (routine x 2)  Result Value Ref Range   Specimen Description BLOOD LEFT ANTECUBITAL    Special Requests      BOTTLES DRAWN AEROBIC AND ANAEROBIC Blood Culture adequate volume   Culture NO GROWTH 5 DAYS    Report Status 12/26/2017 FINAL   Blood culture (routine x 2)  Result Value Ref  Range   Specimen Description BLOOD RIGHT ANTECUBITAL    Special Requests IN PEDIATRIC BOTTLE Blood Culture adequate volume    Culture NO GROWTH 5 DAYS    Report Status 12/26/2017 FINAL   Comprehensive metabolic panel  Result Value Ref Range   Sodium 137 135 - 145 mmol/L   Potassium 4.5 3.5 - 5.1 mmol/L   Chloride 102 101 - 111 mmol/L   CO2 25 22 - 32 mmol/L   Glucose, Bld 109 (H) 65 - 99 mg/dL   BUN 11 6 - 20 mg/dL   Creatinine, Ser 1.02 0.61 - 1.24 mg/dL   Calcium 9.8 8.9 - 10.3 mg/dL   Total Protein 6.8 6.5 - 8.1 g/dL   Albumin 3.5 3.5 - 5.0 g/dL   AST 38 15 - 41 U/L   ALT 45 17 - 63 U/L   Alkaline Phosphatase 46 38 - 126 U/L   Total Bilirubin 0.7 0.3 - 1.2 mg/dL   GFR calc non Af Amer >60 >60 mL/min   GFR calc Af Amer >60 >60 mL/min   Anion gap 10 5 - 15  CBC with Differential  Result Value Ref Range   WBC 11.1 (H) 4.0 - 10.5 K/uL   RBC 4.43 4.22 - 5.81 MIL/uL   Hemoglobin 12.4 (L) 13.0 - 17.0 g/dL   HCT 40.1 39.0 - 52.0 %   MCV 90.5 78.0 - 100.0 fL   MCH 28.0 26.0 - 34.0 pg   MCHC 30.9 30.0 - 36.0 g/dL   RDW 13.9 11.5 - 15.5 %   Platelets 292 150 - 400 K/uL   Neutrophils Relative % 43 %   Neutro Abs 4.7 1.7 - 7.7 K/uL   Lymphocytes Relative 33 %   Lymphs Abs 3.6 0.7 - 4.0 K/uL   Monocytes Relative 9 %   Monocytes Absolute 1.0 0.1 - 1.0 K/uL   Eosinophils Relative 16 %   Eosinophils Absolute 1.7 (H) 0.0 - 0.7 K/uL   Basophils Relative 1 %   Basophils Absolute 0.1 0.0 - 0.1 K/uL  Urinalysis, Routine w reflex microscopic  Result Value Ref Range   Color, Urine YELLOW YELLOW   APPearance CLEAR CLEAR   Specific Gravity, Urine 1.016 1.005 - 1.030   pH 5.0 5.0 - 8.0   Glucose, UA NEGATIVE NEGATIVE mg/dL   Hgb urine dipstick NEGATIVE NEGATIVE   Bilirubin Urine NEGATIVE NEGATIVE   Ketones, ur 5 (A) NEGATIVE mg/dL   Protein, ur NEGATIVE NEGATIVE mg/dL   Nitrite NEGATIVE NEGATIVE   Leukocytes, UA NEGATIVE NEGATIVE   RBC / HPF NONE SEEN 0 - 5 RBC/hpf  WBC, UA 0-5 0  - 5 WBC/hpf   Bacteria, UA NONE SEEN NONE SEEN   Squamous Epithelial / LPF 0-5 (A) NONE SEEN   Mucus PRESENT   Influenza panel by PCR (type A & B)  Result Value Ref Range   Influenza A By PCR NEGATIVE NEGATIVE   Influenza B By PCR NEGATIVE NEGATIVE  I-Stat CG4 Lactic Acid, ED  Result Value Ref Range   Lactic Acid, Venous 3.69 (HH) 0.5 - 1.9 mmol/L   Comment NOTIFIED PHYSICIAN   I-Stat Troponin, ED (not at [facility])  Result Value Ref Range   Troponin i, poc 0.00 0.00 - 0.08 ng/mL   Comment 3          I-Stat CG4 Lactic Acid, ED  Result Value Ref Range   Lactic Acid, Venous 2.69 (HH) 0.5 - 1.9 mmol/L  I-Stat CG4 Lactic Acid, ED  Result Value Ref Range   Lactic Acid, Venous 1.86 0.5 - 1.9 mmol/L    Labs Reviewed - No data to display  No results found.     ASSESSMENT & PLAN:  1. COPD exacerbation [facility])    Ask your primary provider at the [facility] to perform a yearly CAT scan given your smoking history  Meds ordered this encounter  Medications  . ipratropium-albuterol (DUONEB) 0.5-2.5 (3) MG/3ML nebulizer solution 3 mL  . methylPREDNISolone acetate (DEPO-MEDROL) injection 80 mg  . albuterol (PROVENTIL HFA;VENTOLIN HFA) 108 (90 Base) MCG/ACT inhaler    Sig: Inhale 1-2 puffs into the lungs every 6 (six) hours as needed for wheezing or shortness of breath.    Dispense:  1 Inhaler    Refill:  11  . predniSONE (DELTASONE) 20 MG tablet    Sig: Two daily with food    Dispense:  10 tablet    Refill:  0    Reviewed expectations re: course of current medical issues. Questions answered. Outlined signs and symptoms indicating need for more acute intervention. Patient verbalized understanding. After Visit Summary given.    Procedures:      Robyn Haber, MD 04/19/18 1322

## 2018-07-04 ENCOUNTER — Encounter (HOSPITAL_COMMUNITY): Payer: Self-pay

## 2018-07-04 ENCOUNTER — Ambulatory Visit (HOSPITAL_COMMUNITY)
Admission: EM | Admit: 2018-07-04 | Discharge: 2018-07-04 | Disposition: A | Discharge status: Home / Self Care | Payer: Medicare Other | Attending: Internal Medicine | Admitting: Internal Medicine

## 2018-07-04 DIAGNOSIS — J441 Chronic obstructive pulmonary disease with (acute) exacerbation: Secondary | ICD-10-CM | POA: Diagnosis not present

## 2018-07-04 DIAGNOSIS — R0602 Shortness of breath: Secondary | ICD-10-CM | POA: Diagnosis not present

## 2018-07-04 MED ORDER — IPRATROPIUM-ALBUTEROL 0.5-2.5 (3) MG/3ML IN SOLN
3.0000 mL | Freq: Once | RESPIRATORY_TRACT | Status: AC
  Administered 2018-07-04: 3 mL via RESPIRATORY_TRACT

## 2018-07-04 MED ORDER — METHYLPREDNISOLONE ACETATE 80 MG/ML IJ SUSP
INTRAMUSCULAR | Status: AC
  Filled 2018-07-04: qty 1

## 2018-07-04 MED ORDER — METHYLPREDNISOLONE SODIUM SUCC 125 MG IJ SOLR
INTRAMUSCULAR | Status: AC
  Filled 2018-07-04: qty 2

## 2018-07-04 MED ORDER — IPRATROPIUM-ALBUTEROL 0.5-2.5 (3) MG/3ML IN SOLN
RESPIRATORY_TRACT | Status: AC
  Filled 2018-07-04: qty 3

## 2018-07-04 MED ORDER — PREDNISONE 20 MG PO TABS: 40.0000 mg | ORAL_TABLET | Freq: Every day | ORAL | 0 refills | Status: AC

## 2018-07-04 MED ORDER — METHYLPREDNISOLONE SODIUM SUCC 125 MG IJ SOLR
80.0000 mg | Freq: Once | INTRAMUSCULAR | Status: AC
  Administered 2018-07-04: 80 mg via INTRAMUSCULAR

## 2018-07-04 NOTE — ED Triage Notes (Signed)
Pt presents with shortness of breath.  

## 2018-07-04 NOTE — Discharge Instructions (Signed)
Injection of methylprednisolone steroid was given at urgent care today, along with a DuoNeb breathing treatment.  Prescription for prednisone sent to the pharmacy.  Using flonase in addition to wearing a mask when you are outside in the pollen may help decrease frequency of breathlessness episodes.  Continue taking claritin and using your albuterol inhaler as needed.  Anticipate gradual improvement in breathlessness over the next several days.  Recheck for new fever >100.5, increasing phlegm production/nasal discharge, or if not starting to improve in a few days.

## 2018-07-04 NOTE — ED Provider Notes (Signed)
[facility]    CSN: [id number] Arrival date & time: 07/04/18  1002     History   Chief Complaint Chief Complaint  Patient presents with  . Shortness of Breath    HPI Trevor [name] is a 72 y.o. male.   He has past medical history notable for COPD, and presents today with 4-day history of increased breathlessness.  He has some difficulty with episodes of breathlessness after he has been out fishing, when there is pollen.  He takes Claritin daily, and wears a mask when he is outside, to try to minimize exposure to pollen.  He uses Flonase as needed, and albuterol inhaler as needed.  No fever, no change in phlegm production, no change in cough.  No chest discomfort.  No change in chronic mild right greater than left leg swelling.    HPI  Past Medical History:  Diagnosis Date  . BPH (benign prostatic hyperplasia)   . COPD (chronic obstructive pulmonary disease) ([place])   . Diabetes mellitus without complication ([place])   . GERD (gastroesophageal reflux disease)   . Hypercholesterolemia   . Hypertension   . Seizures ([place])   . Type II or unspecified type diabetes mellitus without mention of complication, not stated as uncontrolled     Patient Active Problem List   Diagnosis Date Noted  . Vitamin D deficiency 10/08/2017  . COPD (chronic obstructive pulmonary disease) ([place]) 07/08/2017  . Morbid obesity ([place]) 12/22/2015  . Encounter for Medicare annual wellness exam 12/22/2015  . Hyperlipidemia, mixed   . Hypertension   . Diabetes mellitus due to underlying condition with stage 2 chronic kidney disease ([place])   . Seizures ([place])   . BPH (benign prostatic hyperplasia)   . GERD (gastroesophageal reflux disease)     Past Surgical History:  Procedure Laterality Date  . HAND SURGERY Left 1953  . KNEE ARTHROSCOPY Left 1998       Home Medications    Prior to Admission medications   Medication Sig Start Date End Date Taking? Authorizing Provider  albuterol (PROVENTIL  HFA;VENTOLIN HFA) 108 (90 Base) MCG/ACT inhaler Inhale 1-2 puffs into the lungs every 6 (six) hours as needed for wheezing or shortness of breath. 04/19/18   Trevor Haber, MD  albuterol (VENTOLIN HFA) 108 (90 Base) MCG/ACT inhaler Inhale 2 puffs into the lungs every 4 (four) hours as needed for wheezing or shortness of breath. 12/22/15   Trevor Mutters, PA-C  aspirin 81 MG chewable tablet Chew 81 mg by mouth daily.    [provider]  atenolol (TENORMIN) 100 MG tablet Take 100 mg by mouth daily.    [provider]  divalproex (DEPAKOTE ER) 500 MG 24 hr tablet Take 1 tablet (500 mg total) by mouth 2 (two) times daily. 10/11/17   Trevor Pinto, MD  finasteride (PROSCAR) 5 MG tablet Take 5 mg by mouth every evening.    [provider]  fluticasone (FLONASE) 50 MCG/ACT nasal spray Place 2 sprays into both nostrils daily. Patient taking differently: Place 2 sprays into both nostrils daily as needed for allergies.  12/05/15   [name], Courtney, PA-C  glipiZIDE (GLUCOTROL) 10 MG tablet Take 10 mg by mouth 2 (two) times daily before a meal.    [provider]  guaifenesin (ROBITUSSIN) 100 MG/5ML syrup Take 5-10 mLs (100-200 mg total) by mouth every 4 (four) hours as needed for cough. 12/21/17   [name], Tatyana, PA-C  lisinopril (PRINIVIL,ZESTRIL) 40 MG tablet Take 40 mg by mouth daily.  [provider]  MAGNESIUM PO Take 420 mg by mouth 2 (two) times daily.    [provider]  metFORMIN (GLUCOPHAGE XR) 500 MG 24 hr tablet Take 2 tablets 2 x / day for Diabetes Patient taking differently: Take 500 mg by mouth 3 (three) times daily.  10/02/17 10/02/18  Trevor Pinto, MD  Multiple Vitamin (MULTIVITAMIN) tablet Take 1 tablet by mouth daily.    [provider]  Multiple Vitamins-Minerals (ICAPS AREDS FORMULA PO) Take 1 tablet by mouth daily.    [provider]  pravastatin (PRAVACHOL) 80 MG tablet Take 80 mg by mouth daily.     [provider]  predniSONE (DELTASONE) 20 MG tablet Take 2 tablets (40 mg total) by mouth daily for 5 days. 07/04/18 07/09/18  Trevor Luna, MD    Family History Family History  Problem Relation Age of Onset  . CVA Mother   . Thyroid disease Mother   . Heart disease Father   . Diabetes Sister   . Heart disease Sister   . Diabetes Brother   . Heart disease Brother   . Epilepsy Brother   . Schizophrenia Brother     Social History Social History   Tobacco Use  . Smoking status: Former Smoker    Last attempt to quit: 12/17/2013    Years since quitting: 4.5  . Smokeless tobacco: Never Used  Substance Use Topics  . Alcohol use: No    Alcohol/week: 0.0 oz  . Drug use: No     Allergies   Penicillins and Latex   Review of Systems Review of Systems  All other systems reviewed and are negative.    Physical Exam Triage Vital Signs ED Triage Vitals [07/04/18 1018]  Enc Vitals Group     BP (!) 151/52     Pulse Rate 79     Resp 20     Temp 98.5 F (36.9 C)     Temp Source Oral     SpO2 95 %     Weight      Height      Pain Score      Pain Loc    Updated Vital Signs BP (!) 151/52 (BP Location: Left Arm)   Pulse 79   Temp 98.5 F (36.9 C) (Oral)   Resp 20   SpO2 95%  Physical Exam  Constitutional: He is oriented to person, place, and time.  Alert, nicely groomed Sitting up on the exam table, splinting slightly Speaks in phrases, looks a little breathless  HENT:  Head: Atraumatic.  Eyes:  Conjugate gaze, no eye redness/drainage  Neck: Neck supple.  Cardiovascular: Normal rate and regular rhythm.  Pulmonary/Chest: No respiratory distress.  Markedly diminished breath sounds posteriorly, but symmetric.  Coarse but symmetric breath sounds anteriorly. Occasional soft wheeze, bibasilar faint crackles  Abdominal: He exhibits no distension.  Musculoskeletal: Normal range of motion.  1+ pitting edema in the right lower extremity, trace in the left    Neurological: He is alert and oriented to person, place, and time.  Skin: Skin is warm and dry.  Cheeks are flushed pink  Nursing note and vitals reviewed.    UC Treatments / Results   Procedures Procedures (including critical care time)  Medications Ordered in UC Medications  ipratropium-albuterol (DUONEB) 0.5-2.5 (3) MG/3ML nebulizer solution 3 mL (3 mLs Nebulization Given 07/04/18 1045)  methylPREDNISolone sodium succinate (SOLU-MEDROL) 125 mg/2 mL injection 80 mg (80 mg Intramuscular Given 07/04/18 1045)    Final Clinical  Impressions(s) / UC Diagnoses   Final diagnoses:  COPD exacerbation ([place])     Discharge Instructions     Injection of methylprednisolone steroid was given at urgent care today, along with a DuoNeb breathing treatment.  Prescription for prednisone sent to the pharmacy.  Using flonase in addition to wearing a mask when you are outside in the pollen may help decrease frequency of breathlessness episodes.  Continue taking claritin and using your albuterol inhaler as needed.  Anticipate gradual improvement in breathlessness over the next several days.  Recheck for new fever >100.5, increasing phlegm production/nasal discharge, or if not starting to improve in a few days.      ED Prescriptions    Medication Sig Dispense Auth. Provider   predniSONE (DELTASONE) 20 MG tablet Take 2 tablets (40 mg total) by mouth daily for 5 days. 10 tablet Trevor Luna, MD       Trevor Luna, MD 07/07/18 2109

## 2018-07-29 ENCOUNTER — Encounter (HOSPITAL_COMMUNITY): Payer: Self-pay | Admitting: Emergency Medicine

## 2018-07-29 ENCOUNTER — Other Ambulatory Visit: Payer: Self-pay

## 2018-07-29 ENCOUNTER — Ambulatory Visit (HOSPITAL_COMMUNITY)
Admission: EM | Admit: 2018-07-29 | Discharge: 2018-07-29 | Disposition: A | Discharge status: Home / Self Care | Payer: Medicare Other | Attending: Family Medicine | Admitting: Family Medicine

## 2018-07-29 DIAGNOSIS — R0602 Shortness of breath: Secondary | ICD-10-CM

## 2018-07-29 DIAGNOSIS — J441 Chronic obstructive pulmonary disease with (acute) exacerbation: Secondary | ICD-10-CM

## 2018-07-29 MED ORDER — IPRATROPIUM-ALBUTEROL 0.5-2.5 (3) MG/3ML IN SOLN
RESPIRATORY_TRACT | Status: AC
  Filled 2018-07-29: qty 3

## 2018-07-29 MED ORDER — METHYLPREDNISOLONE ACETATE 80 MG/ML IJ SUSP
INTRAMUSCULAR | Status: AC
  Filled 2018-07-29: qty 1

## 2018-07-29 MED ORDER — PREDNISONE 20 MG PO TABS: 20.0000 mg | ORAL_TABLET | Freq: Every day | ORAL | 0 refills | Status: DC

## 2018-07-29 MED ORDER — IPRATROPIUM-ALBUTEROL 0.5-2.5 (3) MG/3ML IN SOLN
3.0000 mL | Freq: Once | RESPIRATORY_TRACT | Status: AC
  Administered 2018-07-29: 3 mL via RESPIRATORY_TRACT

## 2018-07-29 MED ORDER — METHYLPREDNISOLONE ACETATE 80 MG/ML IJ SUSP
80.0000 mg | Freq: Once | INTRAMUSCULAR | Status: AC
  Administered 2018-07-29: 80 mg via INTRAMUSCULAR

## 2018-07-29 NOTE — ED Triage Notes (Signed)
Sunday evening noticed feeling sob.  Denies a runny nose, denies sinus drainage.  Throat was ticklish.  Patient sleeps propped up as his baseline.  Denies pain.  No changes in medications.    Medicines are not helping as they usually do

## 2018-07-29 NOTE — ED Provider Notes (Signed)
[place]    CSN: [id number] Arrival date & time: 07/29/18  7824     History   Chief Complaint Chief Complaint  Patient presents with  . Shortness of Breath    HPI Trevor [name] is a 72 y.o. male.   Patient has history of COPD comes in today with shortness of breath.  He denies any pain.  Has chronic cough but sputum has not increased urine production or color.  There is some dependent edema but that has not changed.  He has orthopnea, again representing no change from baseline.  HPI  Past Medical History:  Diagnosis Date  . BPH (benign prostatic hyperplasia)   . COPD (chronic obstructive pulmonary disease) ([place])   . Diabetes mellitus without complication ([place])   . GERD (gastroesophageal reflux disease)   . Hypercholesterolemia   . Hypertension   . Seizures ([place])   . Type II or unspecified type diabetes mellitus without mention of complication, not stated as uncontrolled     Patient Active Problem List   Diagnosis Date Noted  . Vitamin D deficiency 10/08/2017  . COPD (chronic obstructive pulmonary disease) ([place]) 07/08/2017  . Morbid obesity ([place]) 12/22/2015  . Encounter for Medicare annual wellness exam 12/22/2015  . Hyperlipidemia, mixed   . Hypertension   . Diabetes mellitus due to underlying condition with stage 2 chronic kidney disease ([place])   . Seizures ([place])   . BPH (benign prostatic hyperplasia)   . GERD (gastroesophageal reflux disease)     Past Surgical History:  Procedure Laterality Date  . HAND SURGERY Left 1953  . KNEE ARTHROSCOPY Left 1998       Home Medications    Prior to Admission medications   Medication Sig Start Date End Date Taking? Authorizing Provider  aspirin 81 MG chewable tablet Chew 81 mg by mouth daily.   Yes [provider]  atenolol (TENORMIN) 100 MG tablet Take 100 mg by mouth daily.   Yes [provider]  divalproex (DEPAKOTE ER) 500 MG 24 hr tablet Take 1 tablet (500 mg total) by mouth 2 (two)  times daily. 10/11/17  Yes Unk Pinto, MD  finasteride (PROSCAR) 5 MG tablet Take 5 mg by mouth every evening.   Yes [provider]  fluticasone (FLONASE) 50 MCG/ACT nasal spray Place 2 sprays into both nostrils daily. Patient taking differently: Place 2 sprays into both nostrils daily as needed for allergies.  12/05/15  Yes Forcucci, Courtney, PA-C  glipiZIDE (GLUCOTROL) 10 MG tablet Take 10 mg by mouth 2 (two) times daily before a meal.   Yes [provider]  lisinopril (PRINIVIL,ZESTRIL) 40 MG tablet Take 40 mg by mouth daily.   Yes [provider]  MAGNESIUM PO Take 420 mg by mouth 2 (two) times daily.   Yes [provider]  metFORMIN (GLUCOPHAGE XR) 500 MG 24 hr tablet Take 2 tablets 2 x / day for Diabetes Patient taking differently: Take 500 mg by mouth 3 (three) times daily.  10/02/17 10/02/18 Yes Unk Pinto, MD  Multiple Vitamin (MULTIVITAMIN) tablet Take 1 tablet by mouth daily.   Yes [provider]  Multiple Vitamins-Minerals (ICAPS AREDS FORMULA PO) Take 1 tablet by mouth daily.   Yes [provider]  NON FORMULARY A cholesterol medicine beginning with ":r"   Yes [provider]  albuterol (PROVENTIL HFA;VENTOLIN HFA) 108 (90 Base) MCG/ACT inhaler Inhale 1-2 puffs into the lungs every 6 (six) hours as needed for wheezing or shortness of breath. 04/19/18  Robyn Haber, MD  albuterol (VENTOLIN HFA) 108 (90 Base) MCG/ACT inhaler Inhale 2 puffs into the lungs every 4 (four) hours as needed for wheezing or shortness of breath. 12/22/15   Vicie Mutters, PA-C  guaifenesin (ROBITUSSIN) 100 MG/5ML syrup Take 5-10 mLs (100-200 mg total) by mouth every 4 (four) hours as needed for cough. 12/21/17   Kirichenko, Tatyana, PA-C  pravastatin (PRAVACHOL) 80 MG tablet Take 80 mg by mouth daily.    [provider]    Family History Family History  Problem Relation Age of Onset  . CVA Mother   . Thyroid disease Mother    . Heart disease Father   . Diabetes Sister   . Heart disease Sister   . Diabetes Brother   . Heart disease Brother   . Epilepsy Brother   . Schizophrenia Brother     Social History Social History   Tobacco Use  . Smoking status: Former Smoker    Last attempt to quit: 12/17/2013    Years since quitting: 4.6  . Smokeless tobacco: Never Used  Substance Use Topics  . Alcohol use: No    Alcohol/week: 0.0 standard drinks  . Drug use: No     Allergies   Penicillins and Latex   Review of Systems Review of Systems  Constitutional: Negative.   HENT: Negative.   Respiratory: Positive for shortness of breath.   Cardiovascular: Positive for leg swelling.  Gastrointestinal: Negative.      Physical Exam Triage Vital Signs ED Triage Vitals  Enc Vitals Group     BP 07/29/18 0852 (!) 153/61     Pulse Rate 07/29/18 0852 72     Resp 07/29/18 0852 (!) 22     Temp 07/29/18 0852 98.5 F (36.9 C)     Temp Source 07/29/18 0852 Oral     SpO2 07/29/18 0852 97 %     Weight --      Height --      Head Circumference --      Peak Flow --      Pain Score 07/29/18 0848 0     Pain Loc --      Pain Edu? --      Excl. in [place]? --    No data found.  Updated Vital Signs BP (!) 153/61 (BP Location: Left Arm)   Pulse 72   Temp 98.5 F (36.9 C) (Oral)   Resp (!) 22 Comment: at rest  SpO2 97% Comment: while regularly talking  Visual Acuity Right Eye Distance:   Left Eye Distance:   Bilateral Distance:    Right Eye Near:   Left Eye Near:    Bilateral Near:     Physical Exam  Constitutional: He is oriented to person, place, and time. He appears well-developed and well-nourished.  HENT:  Mouth/Throat: Oropharynx is clear and moist.  Neck: Normal range of motion.  Cardiovascular: Normal rate.  Pulmonary/Chest: [place] normal.  Neurological: He is alert and oriented to person, place, and time.     UC Treatments / Results  Labs (all labs ordered are listed, but only abnormal  results are displayed) Labs Reviewed - No data to display  EKG None  Radiology No results found.  Procedures Procedures (including critical care time)  Medications Ordered in UC Medications  ipratropium-albuterol (DUONEB) 0.5-2.5 (3) MG/3ML nebulizer solution 3 mL (has no administration in time range)  methylPREDNISolone acetate (DEPO-MEDROL) injection 80 mg (has no administration in time range)    Initial Impression / Assessment  and Plan / UC Course  I have reviewed the triage vital signs and the nursing notes.  Pertinent labs & imaging results that were available during my care of the patient were reviewed by me and considered in my medical decision making (see chart for details).     COPD, acute exacerbation. Final Clinical Impressions(s) / UC Diagnoses   Final diagnoses:  None   Discharge Instructions   None    ED Prescriptions    None     Controlled Substance Prescriptions [place] Controlled Substance Registry consulted? No   Wardell Honour, MD 07/29/18 [phone]

## 2018-11-02 ENCOUNTER — Other Ambulatory Visit: Payer: Self-pay

## 2018-11-02 ENCOUNTER — Ambulatory Visit (HOSPITAL_COMMUNITY)
Admission: EM | Admit: 2018-11-02 | Discharge: 2018-11-02 | Disposition: A | Discharge status: Home / Self Care | Payer: Medicare Other | Attending: Family Medicine | Admitting: Family Medicine

## 2018-11-02 ENCOUNTER — Encounter (HOSPITAL_COMMUNITY): Payer: Self-pay | Admitting: Emergency Medicine

## 2018-11-02 DIAGNOSIS — R2241 Localized swelling, mass and lump, right lower limb: Secondary | ICD-10-CM | POA: Diagnosis not present

## 2018-11-02 DIAGNOSIS — R600 Localized edema: Secondary | ICD-10-CM | POA: Diagnosis not present

## 2018-11-02 DIAGNOSIS — I10 Essential (primary) hypertension: Secondary | ICD-10-CM

## 2018-11-02 DIAGNOSIS — R0989 Other specified symptoms and signs involving the circulatory and respiratory systems: Secondary | ICD-10-CM

## 2018-11-02 DIAGNOSIS — R2242 Localized swelling, mass and lump, left lower limb: Secondary | ICD-10-CM

## 2018-11-02 DIAGNOSIS — J441 Chronic obstructive pulmonary disease with (acute) exacerbation: Secondary | ICD-10-CM | POA: Diagnosis not present

## 2018-11-02 MED ORDER — METHYLPREDNISOLONE ACETATE 80 MG/ML IJ SUSP
INTRAMUSCULAR | Status: AC
  Filled 2018-11-02: qty 1

## 2018-11-02 MED ORDER — PREDNISONE 20 MG PO TABS: ORAL_TABLET | ORAL | 0 refills | Status: DC

## 2018-11-02 MED ORDER — FUROSEMIDE 20 MG PO TABS: 20.0000 mg | ORAL_TABLET | Freq: Every day | ORAL | 0 refills | Status: DC

## 2018-11-02 MED ORDER — IPRATROPIUM-ALBUTEROL 0.5-2.5 (3) MG/3ML IN SOLN
3.0000 mL | Freq: Once | RESPIRATORY_TRACT | Status: AC
  Administered 2018-11-02: 3 mL via RESPIRATORY_TRACT

## 2018-11-02 MED ORDER — METHYLPREDNISOLONE ACETATE 80 MG/ML IJ SUSP
80.0000 mg | Freq: Once | INTRAMUSCULAR | Status: AC
  Administered 2018-11-02: 80 mg via INTRAMUSCULAR

## 2018-11-02 MED ORDER — IPRATROPIUM-ALBUTEROL 0.5-2.5 (3) MG/3ML IN SOLN
RESPIRATORY_TRACT | Status: AC
  Filled 2018-11-02: qty 3

## 2018-11-02 NOTE — ED Triage Notes (Addendum)
The patient presented to the [facility] with a complaint of congestion and fluid build up in his lower extremities x 2 days.

## 2018-11-02 NOTE — ED Provider Notes (Signed)
[place]    CSN: [id number] Arrival date & time: 11/02/18  1012     History   Chief Complaint Chief Complaint  Patient presents with  . Leg Swelling    HPI Trevor [name] is a 72 y.o. male.   72 year old male presents with chest congestion and cough that has become worse in the past 2 to 3 days. Has history of COPD and occasionally has exacerbations. Last one was August 2019 here. He has tried his Albuterol inhaler at home and Flonase with minimal relief. He has not used any cough medication yet. He had worsening of wheezing last night and was unable to sleep. Denies any distinct fever, nausea, vomiting or diarrhea. He has minimal nasal congestion. He has been given a shot of Prednisone and Duoneb breathing treatment in the past with good success. He has chronic bilaterally peripheral edema with left leg slightly worse than right- has noticed slight increase in edema in the past 2 days. He has history of HTN, DM, hyperlipidemia, BPH, GERD, seizures, and COPD and currently on Atenolol, Lisinopril, Proscar, Pravachol, Metformin, Glipizide, aspirin, and  Depakote daily.   The history is provided by the patient.    Past Medical History:  Diagnosis Date  . BPH (benign prostatic hyperplasia)   . COPD (chronic obstructive pulmonary disease) ([place])   . Diabetes mellitus without complication ([place])   . GERD (gastroesophageal reflux disease)   . Hypercholesterolemia   . Hypertension   . Seizures ([place])   . Type II or unspecified type diabetes mellitus without mention of complication, not stated as uncontrolled     Patient Active Problem List   Diagnosis Date Noted  . Vitamin D deficiency 10/08/2017  . COPD (chronic obstructive pulmonary disease) ([place]) 07/08/2017  . Morbid obesity ([place]) 12/22/2015  . Encounter for Medicare annual wellness exam 12/22/2015  . Hyperlipidemia, mixed   . Hypertension   . Diabetes mellitus due to underlying condition with stage 2 chronic kidney  disease ([place])   . Seizures ([place])   . BPH (benign prostatic hyperplasia)   . GERD (gastroesophageal reflux disease)     Past Surgical History:  Procedure Laterality Date  . HAND SURGERY Left 1953  . KNEE ARTHROSCOPY Left 1998       Home Medications    Prior to Admission medications   Medication Sig Start Date End Date Taking? Authorizing Provider  albuterol (PROVENTIL HFA;VENTOLIN HFA) 108 (90 Base) MCG/ACT inhaler Inhale 1-2 puffs into the lungs every 6 (six) hours as needed for wheezing or shortness of breath. 04/19/18   Robyn Haber, MD  aspirin 81 MG chewable tablet Chew 81 mg by mouth daily.    [provider]  atenolol (TENORMIN) 100 MG tablet Take 100 mg by mouth daily.    [provider]  divalproex (DEPAKOTE ER) 500 MG 24 hr tablet Take 1 tablet (500 mg total) by mouth 2 (two) times daily. 10/11/17   Unk Pinto, MD  finasteride (PROSCAR) 5 MG tablet Take 5 mg by mouth every evening.    [provider]  fluticasone (FLONASE) 50 MCG/ACT nasal spray Place 2 sprays into both nostrils daily. Patient taking differently: Place 2 sprays into both nostrils daily as needed for allergies.  12/05/15   Rolene Course, PA-C  furosemide (LASIX) 20 MG tablet Take 1 tablet (20 mg total) by mouth daily. 11/02/18   Katy Apo, NP  glipiZIDE (GLUCOTROL) 10 MG tablet Take 10 mg by mouth 2 (two) times daily  before a meal.    [provider]  guaifenesin (ROBITUSSIN) 100 MG/5ML syrup Take 5-10 mLs (100-200 mg total) by mouth every 4 (four) hours as needed for cough. 12/21/17   Kirichenko, Tatyana, PA-C  lisinopril (PRINIVIL,ZESTRIL) 40 MG tablet Take 40 mg by mouth daily.    [provider]  MAGNESIUM PO Take 420 mg by mouth 2 (two) times daily.    [provider]  metFORMIN (GLUCOPHAGE XR) 500 MG 24 hr tablet Take 2 tablets 2 x / day for Diabetes Patient taking differently: Take 500 mg by mouth 3 (three) times daily.  10/02/17  10/02/18  Unk Pinto, MD  Multiple Vitamin (MULTIVITAMIN) tablet Take 1 tablet by mouth daily.    [provider]  Multiple Vitamins-Minerals (ICAPS AREDS FORMULA PO) Take 1 tablet by mouth daily.    [provider]  NON FORMULARY A cholesterol medicine beginning with ":r"    [provider]  pravastatin (PRAVACHOL) 80 MG tablet Take 80 mg by mouth daily.    [provider]  predniSONE (DELTASONE) 20 MG tablet Take 3 tablets by mouth together on day 1 and day 2, then 2 tablets on day 3 and 4 and 1 tablet on day 5 and 6. 11/02/18   Regenia Erck, Nicholes Stairs, NP    Family History Family History  Problem Relation Age of Onset  . CVA Mother   . Thyroid disease Mother   . Heart disease Father   . Diabetes Sister   . Heart disease Sister   . Diabetes Brother   . Heart disease Brother   . Epilepsy Brother   . Schizophrenia Brother     Social History Social History   Tobacco Use  . Smoking status: Former Smoker    Last attempt to quit: 12/17/2013    Years since quitting: 4.8  . Smokeless tobacco: Never Used  Substance Use Topics  . Alcohol use: No    Alcohol/week: 0.0 standard drinks  . Drug use: No     Allergies   Penicillins and Latex   Review of Systems Review of Systems  Constitutional: Positive for fatigue. Negative for activity change, appetite change, chills and fever.  HENT: Positive for congestion and postnasal drip. Negative for ear discharge, ear pain, facial swelling, mouth sores, nosebleeds, rhinorrhea, sinus pressure, sinus pain, sneezing, sore throat and trouble swallowing.   Eyes: Negative for pain, discharge, redness and itching.  Respiratory: Positive for cough, shortness of breath and wheezing. Negative for chest tightness.   Cardiovascular: Positive for leg swelling (bilateral). Negative for chest pain and palpitations.  Gastrointestinal: Negative for abdominal pain, diarrhea, nausea and vomiting.  Genitourinary: Negative for  decreased urine volume, difficulty urinating, dysuria and hematuria.  Musculoskeletal: Positive for arthralgias (left knee). Negative for myalgias, neck pain and neck stiffness.  Skin: Negative for color change, rash and wound.  Neurological: Positive for seizures (in past- none recently). Negative for dizziness, tremors, syncope, speech difficulty, weakness, light-headedness, numbness and headaches.  Hematological: Negative for adenopathy. Does not bruise/bleed easily.     Physical Exam Triage Vital Signs ED Triage Vitals  Enc Vitals Group     BP 11/02/18 1035 (!) 150/67     Pulse Rate 11/02/18 1035 78     Resp 11/02/18 1035 20     Temp 11/02/18 1035 98 F (36.7 C)     Temp Source 11/02/18 1035 Oral     SpO2 11/02/18 1035 96 %     Weight --  Height --      Head Circumference --      Peak Flow --      Pain Score 11/02/18 1036 0     Pain Loc --      Pain Edu? --      Excl. in [place]? --    No data found.  Updated Vital Signs BP (!) 150/67 (BP Location: Left Arm)   Pulse 78   Temp 98 F (36.7 C) (Oral)   Resp 20   SpO2 96%   Visual Acuity Right Eye Distance:   Left Eye Distance:   Bilateral Distance:    Right Eye Near:   Left Eye Near:    Bilateral Near:     Physical Exam  Constitutional: He is oriented to person, place, and time. He appears well-developed and well-nourished. He is cooperative. No distress.  Patient sitting comfortably on exam table in slight splinting position in no acute distress but appears tired.  Uses a cane for support with ambulation  HENT:  Head: Normocephalic and atraumatic.  Right Ear: Tympanic membrane, external ear and ear canal normal. Decreased hearing is noted.  Left Ear: Tympanic membrane, external ear and ear canal normal. Decreased hearing is noted.  Nose: Nose normal. Right sinus exhibits no maxillary sinus tenderness and no frontal sinus tenderness. Left sinus exhibits no maxillary sinus tenderness and no frontal sinus  tenderness.  Mouth/Throat: Uvula is midline, oropharynx is clear and moist and mucous membranes are normal.  Eyes: Conjunctivae and EOM are normal.  Neck: Normal range of motion. Neck supple.  Cardiovascular: Normal rate, intact distal pulses and normal pulses.  Murmur heard. Pulses:      Radial pulses are 2+ on the right side, and 2+ on the left side.       Dorsalis pedis pulses are 2+ on the right side, and 2+ on the left side.       Posterior tibial pulses are 2+ on the right side, and 2+ on the left side.  Pulmonary/Chest: Tachypneic: borderline. No respiratory distress. He has decreased breath sounds in the right upper field, the right lower field, the left upper field and the left lower field. He has wheezes in the right upper field, the right lower field, the left upper field and the left lower field. He has no rhonchi. He has no rales.  Slight increased effort in breathing.   Musculoskeletal: He exhibits edema. He exhibits no tenderness.       Right foot: There is swelling. There is normal range of motion, no tenderness, normal capillary refill and no deformity.       Left foot: There is swelling. There is normal range of motion, no tenderness, normal capillary refill and no deformity.  Bilateral 1+ pitting edema in right leg and ankle and 2+ pitting edema in left leg and ankle. No numbness or pain. Normal 2+ pedal pulses. Good capillary refill but toenails yellow and in poor repair. No neuro deficits noted.   Feet:  Right Foot:  Skin Integrity: Negative for ulcer, blister, skin breakdown, erythema or warmth.  Left Foot:  Skin Integrity: Negative for ulcer, blister, skin breakdown, erythema or warmth.  Lymphadenopathy:    He has no cervical adenopathy.  Neurological: He is alert and oriented to person, place, and time. He has normal strength. No sensory deficit.  Skin: Skin is warm, dry and intact. Capillary refill takes less than 2 seconds. No bruising, no ecchymosis, no lesion, no  petechiae and no rash noted.  No erythema.  Psychiatric: He has a normal mood and affect. His speech is normal. Judgment and thought content normal. He is slowed.  Vitals reviewed.    UC Treatments / Results  Labs (all labs ordered are listed, but only abnormal results are displayed) Labs Reviewed - No data to display  EKG None  Radiology No results found.  Procedures Procedures (including critical care time)  Medications Ordered in UC Medications  ipratropium-albuterol (DUONEB) 0.5-2.5 (3) MG/3ML nebulizer solution 3 mL (3 mLs Nebulization Given 11/02/18 1124)  methylPREDNISolone acetate (DEPO-MEDROL) injection 80 mg (80 mg Intramuscular Given 11/02/18 1125)    Initial Impression / Assessment and Plan / UC Course  I have reviewed the triage vital signs and the nursing notes.  Pertinent labs & imaging results that were available during my care of the patient were reviewed by me and considered in my medical decision making (see chart for details).    Discussed with patient that he appears to have another exacerbation of his COPD. Do not feel this is bacterial- no antibiotic indicated at this time. Gave DuoNeb treatment and DepoMedrol 80mg  IM. After treatment, patient indicated that he could breathe easier and less chest congestion. Less wheezes noted on exam. Discussed that peripheral edema can have multiple causes but needs further evaluation for possible cardiac cause. Patient has appointment with his PCP in 2 days and wants to discuss further evaluation and treatment with his PCP at that time. May need Echocardiogram. Will trial Lasix 20mg  once daily for the next 3 to 5 days. May start Prednisone 20mg  - take 3 tablets for 2 days then 2 tablets for 2 days then 1 tablet for 2 days to help with wheezing and congestion. May continue Albuterol inhaler as needed.  Continue to monitor symptoms. Recommend follow-up with his PCP in 2 days as planned or go to the ER if cough, congestion or  peripheral edema worsen.  Final Clinical Impressions(s) / UC Diagnoses   Final diagnoses:  COPD exacerbation ([place])  Chest congestion  Bilateral lower extremity edema     Discharge Instructions     Recommend start Prednisone 20mg  tablets- take 3 tablets today and tomorrow and then 2 tablets for 2 days and then 1 tablet for 2 days and finish. May trial Lasix (fluid pill) 1 tablet once daily in AM. Continue to monitor symptoms. May use Albuterol inhaler every 6 hours as needed. If cough, congestion and swelling continue to increase, go to the ER. Otherwise, follow-up with your PCP in 2 days as planned.     ED Prescriptions    Medication Sig Dispense Auth. Provider   predniSONE (DELTASONE) 20 MG tablet Take 3 tablets by mouth together on day 1 and day 2, then 2 tablets on day 3 and 4 and 1 tablet on day 5 and 6. 12 tablet Marna Weniger, Nicholes Stairs, NP   furosemide (LASIX) 20 MG tablet Take 1 tablet (20 mg total) by mouth daily. 5 tablet Katy Apo, NP     Controlled Substance Prescriptions [place] Controlled Substance Registry consulted? Not Applicable   Katy Apo, NP 11/02/18 2310

## 2018-11-02 NOTE — Discharge Instructions (Addendum)
Recommend start Prednisone 20mg  tablets- take 3 tablets today and tomorrow and then 2 tablets for 2 days and then 1 tablet for 2 days and finish. May trial Lasix (fluid pill) 1 tablet once daily in AM. Continue to monitor symptoms. May use Albuterol inhaler every 6 hours as needed. If cough, congestion and swelling continue to increase, go to the ER. Otherwise, follow-up with your PCP in 2 days as planned.

## 2019-01-27 ENCOUNTER — Encounter (HOSPITAL_COMMUNITY): Payer: Self-pay

## 2019-01-27 ENCOUNTER — Other Ambulatory Visit: Payer: Self-pay

## 2019-01-27 ENCOUNTER — Ambulatory Visit (HOSPITAL_COMMUNITY)
Admission: EM | Admit: 2019-01-27 | Discharge: 2019-01-27 | Disposition: A | Discharge status: Home / Self Care | Payer: Medicare Other | Attending: Family Medicine | Admitting: Family Medicine

## 2019-01-27 DIAGNOSIS — J40 Bronchitis, not specified as acute or chronic: Secondary | ICD-10-CM

## 2019-01-27 MED ORDER — PREDNISONE 50 MG PO TABS: 50.0000 mg | ORAL_TABLET | Freq: Every day | ORAL | 0 refills | Status: DC

## 2019-01-27 MED ORDER — DOXYCYCLINE HYCLATE 100 MG PO CAPS: 100.0000 mg | ORAL_CAPSULE | Freq: Two times a day (BID) | ORAL | 0 refills | Status: DC

## 2019-01-27 NOTE — Discharge Instructions (Signed)
Start doxycycline and prednisone as directed. You can continue flonase for nasal congestion/drainage. You can use over the counter nasal saline rinse such as neti pot for nasal congestion. Keep hydrated, your urine should be clear to pale yellow in color. Tylenol/motrin for fever and pain. Monitor for any worsening of symptoms, chest pain, shortness of breath, wheezing, swelling of the throat, follow up for reevaluation.

## 2019-01-27 NOTE — ED Triage Notes (Addendum)
Pt cc cough and congestion. Pt fell last Friday he's not sure how or why. Pt states his legs gave a away. Pt has a tremble that started 6 months ago and he dose not know why.

## 2019-01-27 NOTE — ED Provider Notes (Signed)
Trevor [name]    CSN: [id number] Arrival date & time: 01/27/19  1445     History   Chief Complaint Chief Complaint  Patient presents with  . Cough    HPI Trevor [name] is a 73 y.o. male.   73 year old male with history of COPD, DM, seizure comes in for 1 week history of URI symptoms that seems to be worsening.  States has a cough, rhinorrhea, nasal congestion, subjective fever.  Now with shortness of breath and wheezing that is relieved by albuterol treatments.  He denies chest pain, leg swelling.  Positive sick contact.  Former smoker.  Family member states that patient fell few days ago, feels that his leg gave away.  States he thinks the tremors had caused the fall.  Denies head injury or loss of consciousness.  Has not had any pain since.  Has had tremors for the past 6 months, is currently being evaluated at the [facility] for this condition.  Denies acute worsening on Friday.  Denies one-sided weakness, aphasia, worsening ataxia.     Past Medical History:  Diagnosis Date  . BPH (benign prostatic hyperplasia)   . COPD (chronic obstructive pulmonary disease) ([place])   . Diabetes mellitus without complication ([place])   . GERD (gastroesophageal reflux disease)   . Hypercholesterolemia   . Hypertension   . Seizures ([place])   . Type II or unspecified type diabetes mellitus without mention of complication, not stated as uncontrolled     Patient Active Problem List   Diagnosis Date Noted  . Vitamin D deficiency 10/08/2017  . COPD (chronic obstructive pulmonary disease) ([place]) 07/08/2017  . Morbid obesity ([place]) 12/22/2015  . Encounter for Medicare annual wellness exam 12/22/2015  . Hyperlipidemia, mixed   . Hypertension   . Diabetes mellitus due to underlying condition with stage 2 chronic kidney disease ([place])   . Seizures ([place])   . BPH (benign prostatic hyperplasia)   . GERD (gastroesophageal reflux disease)     Past Surgical History:  Procedure Laterality Date    . HAND SURGERY Left 1953  . KNEE ARTHROSCOPY Left 1998       Home Medications    Prior to Admission medications   Medication Sig Start Date End Date Taking? Authorizing Provider  albuterol (PROVENTIL HFA;VENTOLIN HFA) 108 (90 Base) MCG/ACT inhaler Inhale 1-2 puffs into the lungs every 6 (six) hours as needed for wheezing or shortness of breath. 04/19/18   Robyn Haber, MD  aspirin 81 MG chewable tablet Chew 81 mg by mouth daily.    [provider]  atenolol (TENORMIN) 100 MG tablet Take 100 mg by mouth daily.    [provider]  divalproex (DEPAKOTE ER) 500 MG 24 hr tablet Take 1 tablet (500 mg total) by mouth 2 (two) times daily. 10/11/17   Unk Pinto, MD  doxycycline (VIBRAMYCIN) 100 MG capsule Take 1 capsule (100 mg total) by mouth 2 (two) times daily. 01/27/19   Tasia Catchings, Wonda Goodgame V, PA-C  finasteride (PROSCAR) 5 MG tablet Take 5 mg by mouth every evening.    [provider]  fluticasone (FLONASE) 50 MCG/ACT nasal spray Place 2 sprays into both nostrils daily. Patient taking differently: Place 2 sprays into both nostrils daily as needed for allergies.  12/05/15   Rolene Course, PA-C  furosemide (LASIX) 20 MG tablet Take 1 tablet (20 mg total) by mouth daily. 11/02/18   Katy Apo, NP  glipiZIDE (GLUCOTROL) 10 MG tablet Take 10 mg by mouth 2 (two)  times daily before a meal.    [provider]  guaifenesin (ROBITUSSIN) 100 MG/5ML syrup Take 5-10 mLs (100-200 mg total) by mouth every 4 (four) hours as needed for cough. 12/21/17   Kirichenko, Tatyana, PA-C  lisinopril (PRINIVIL,ZESTRIL) 40 MG tablet Take 40 mg by mouth daily.    [provider]  MAGNESIUM PO Take 420 mg by mouth 2 (two) times daily.    [provider]  metFORMIN (GLUCOPHAGE XR) 500 MG 24 hr tablet Take 2 tablets 2 x / day for Diabetes Patient taking differently: Take 500 mg by mouth 3 (three) times daily.  10/02/17 10/02/18  Unk Pinto, MD  Multiple Vitamin  (MULTIVITAMIN) tablet Take 1 tablet by mouth daily.    [provider]  Multiple Vitamins-Minerals (ICAPS AREDS FORMULA PO) Take 1 tablet by mouth daily.    [provider]  NON FORMULARY A cholesterol medicine beginning with ":r"    [provider]  pravastatin (PRAVACHOL) 80 MG tablet Take 80 mg by mouth daily.    [provider]  predniSONE (DELTASONE) 50 MG tablet Take 1 tablet (50 mg total) by mouth daily. 01/27/19   Ok Edwards, PA-C    Family History Family History  Problem Relation Age of Onset  . CVA Mother   . Thyroid disease Mother   . Heart disease Father   . Diabetes Sister   . Heart disease Sister   . Diabetes Brother   . Heart disease Brother   . Epilepsy Brother   . Schizophrenia Brother     Social History Social History   Tobacco Use  . Smoking status: Former Smoker    Last attempt to quit: 12/17/2013    Years since quitting: 5.1  . Smokeless tobacco: Never Used  Substance Use Topics  . Alcohol use: No    Alcohol/week: 0.0 standard drinks  . Drug use: No     Allergies   Penicillins and Latex   Review of Systems Review of Systems  Reason unable to perform ROS: See HPI as above.     Physical Exam Triage Vital Signs ED Triage Vitals  Enc Vitals Group     BP 01/27/19 1528 121/68     Pulse Rate 01/27/19 1528 82     Resp 01/27/19 1528 18     Temp 01/27/19 1528 97.7 F (36.5 C)     Temp Source 01/27/19 1528 Oral     SpO2 01/27/19 1528 98 %     Weight 01/27/19 1528 192 lb 12.8 oz (87.5 kg)     Height --      Head Circumference --      Peak Flow --      Pain Score 01/27/19 1533 0     Pain Loc --      Pain Edu? --      Excl. in [place]? --    No data found.  Updated Vital Signs BP 121/68 (BP Location: Right Arm)   Pulse 82   Temp 97.7 F (36.5 C) (Oral)   Resp 18   Wt 192 lb 12.8 oz (87.5 kg)   SpO2 98%   BMI 26.89 kg/m   Physical Exam Constitutional:      General: He is not in acute distress.     Appearance: He is well-developed. He is not ill-appearing, toxic-appearing or diaphoretic.  HENT:     Head: Normocephalic and atraumatic.     Right Ear: Tympanic membrane, ear canal and external ear normal. Tympanic membrane  is not erythematous or bulging.     Left Ear: Tympanic membrane, ear canal and external ear normal. Tympanic membrane is not erythematous or bulging.     Nose: Nose normal.     Right Sinus: No maxillary sinus tenderness or frontal sinus tenderness.     Left Sinus: No maxillary sinus tenderness or frontal sinus tenderness.     Mouth/Throat:     Mouth: Mucous membranes are moist.     Pharynx: Oropharynx is clear. Uvula midline.  Eyes:     Conjunctiva/sclera: Conjunctivae normal.     Pupils: Pupils are equal, round, and reactive to light.  Neck:     Musculoskeletal: Normal range of motion and neck supple.  Cardiovascular:     Rate and Rhythm: Normal rate and regular rhythm.     Heart sounds: Normal heart sounds. No murmur. No friction rub. No gallop.   Pulmonary:     Effort: Pulmonary effort is normal.     Breath sounds: Normal breath sounds. No decreased breath sounds, wheezing, rhonchi or rales.  Lymphadenopathy:     Cervical: No cervical adenopathy.  Skin:    General: Skin is warm and dry.  Neurological:     Mental Status: He is alert and oriented to person, place, and time. Mental status is at baseline.     GCS: GCS eye subscore is 4. GCS verbal subscore is 5. GCS motor subscore is 6.     Comments: Left sided tremors at baseline. Walks with a cane. Gait normal per family member. Strength equal bilaterally.   Psychiatric:        Behavior: Behavior normal.        Judgment: Judgment normal.    UC Treatments / Results  Labs (all labs ordered are listed, but only abnormal results are displayed) Labs Reviewed - No data to display  EKG None  Radiology No results found.  Procedures Procedures (including critical care time)  Medications Ordered in  UC Medications - No data to display  Initial Impression / Assessment and Plan / UC Course  I have reviewed the triage vital signs and the nursing notes.  Pertinent labs & imaging results that were available during my care of the patient were reviewed by me and considered in my medical decision making (see chart for details).    Will cover for COPD exacerbation with doxycycline and prednisone.  Other symptomatic treatment discussed.  Push fluids.  Return precautions given.  Patient with 6 months history of left-sided tremors, this is being evaluated by his PCP.  Denies acute worsening, one-sided weakness, worsening ataxia, aphasia.  Strict return precautions given.  Patient and family member expresses understanding and agrees to plan.  Final Clinical Impressions(s) / UC Diagnoses   Final diagnoses:  Bronchitis    ED Prescriptions    Medication Sig Dispense Auth. Provider   predniSONE (DELTASONE) 50 MG tablet Take 1 tablet (50 mg total) by mouth daily. 5 tablet Trenia Tennyson V, PA-C   doxycycline (VIBRAMYCIN) 100 MG capsule Take 1 capsule (100 mg total) by mouth 2 (two) times daily. 20 capsule Tobin Chad, [place] 01/27/19 1636

## 2019-03-16 ENCOUNTER — Encounter (HOSPITAL_COMMUNITY): Payer: Self-pay

## 2019-03-16 ENCOUNTER — Other Ambulatory Visit: Payer: Self-pay

## 2019-03-16 ENCOUNTER — Ambulatory Visit (HOSPITAL_COMMUNITY)
Admission: EM | Admit: 2019-03-16 | Discharge: 2019-03-16 | Disposition: A | Discharge status: Home / Self Care | Payer: Medicare Other | Attending: Family Medicine | Admitting: Family Medicine

## 2019-03-16 ENCOUNTER — Ambulatory Visit (INDEPENDENT_AMBULATORY_CARE_PROVIDER_SITE_OTHER): Payer: Medicare Other

## 2019-03-16 DIAGNOSIS — R062 Wheezing: Secondary | ICD-10-CM | POA: Diagnosis not present

## 2019-03-16 DIAGNOSIS — J441 Chronic obstructive pulmonary disease with (acute) exacerbation: Secondary | ICD-10-CM | POA: Diagnosis not present

## 2019-03-16 DIAGNOSIS — R0602 Shortness of breath: Secondary | ICD-10-CM | POA: Diagnosis not present

## 2019-03-16 DIAGNOSIS — R05 Cough: Secondary | ICD-10-CM | POA: Diagnosis not present

## 2019-03-16 MED ORDER — ALBUTEROL SULFATE HFA 108 (90 BASE) MCG/ACT IN AERS
INHALATION_SPRAY | RESPIRATORY_TRACT | Status: AC
  Filled 2019-03-16: qty 6.7

## 2019-03-16 MED ORDER — PREDNISONE 20 MG PO TABS: 40.0000 mg | ORAL_TABLET | Freq: Every day | ORAL | 0 refills | Status: AC

## 2019-03-16 MED ORDER — ALBUTEROL SULFATE HFA 108 (90 BASE) MCG/ACT IN AERS: 2.0000 | INHALATION_SPRAY | Freq: Once | RESPIRATORY_TRACT | Status: DC

## 2019-03-16 MED ORDER — ALBUTEROL SULFATE HFA 108 (90 BASE) MCG/ACT IN AERS
3.0000 | INHALATION_SPRAY | Freq: Once | RESPIRATORY_TRACT | Status: AC
  Administered 2019-03-16: 3 via RESPIRATORY_TRACT

## 2019-03-16 MED ORDER — IPRATROPIUM-ALBUTEROL 0.5-2.5 (3) MG/3ML IN SOLN: 3.0000 mL | Freq: Four times a day (QID) | RESPIRATORY_TRACT | 0 refills | Status: AC | PRN

## 2019-03-16 MED ORDER — DOXYCYCLINE HYCLATE 100 MG PO CAPS: 100.0000 mg | ORAL_CAPSULE | Freq: Two times a day (BID) | ORAL | 0 refills | Status: AC

## 2019-03-16 MED ORDER — BENZONATATE 200 MG PO CAPS
200.0000 mg | ORAL_CAPSULE | Freq: Three times a day (TID) | ORAL | 0 refills | Status: AC | PRN
Start: 2019-03-16 — End: 2019-03-23

## 2019-03-16 NOTE — ED Triage Notes (Signed)
Patient presents to Urgent Care with complaints of shortness of breath since 3 weeks ago. Patient states he has been seen at multiple clinics. Pt is SOB during triage, not able to speak in complete sentences. Pt also has a baseline tremor that he is concerned with, began 6 months ago.

## 2019-03-16 NOTE — Discharge Instructions (Addendum)
Please continue to use albuterol inhaler as needed for shortness of breath and wheezing Please begin using DuoNeb nebulizing breathing treatments at home to further help with shortness of breath and wheezing as needed Begin doxycycline twice daily over the next 10 days Prednisone daily for 5 days with breakfast May use Tessalon as needed every 8 hours for cough Continue Flonase  If your shortness of breath and wheezing do not improve with using the breathing treatments at home and the above recommendations in the next 24 to 48 hours please follow-up in the emergency room.  Please also follow-up if you develop fevers, worsening symptoms, chest discomfort, persistent symptoms

## 2019-03-16 NOTE — ED Provider Notes (Signed)
[place]    CSN: [id number] Arrival date & time: 03/16/19  0906     History   Chief Complaint Chief Complaint  Patient presents with  . Shortness of Breath    HPI Trevor [name] is a 73 y.o. male history of DM type II, hypertension, COPD, presenting today for evaluation of shortness of breath.  Patient states that he has had cough and shortness of breath since Friday and over the past 4 days.  He denies any fevers.  He is also had associated congestion and rhinorrhea.  Denies sore throat.  Denies chest pain.  Notes that he feels his allergies have flared up which has worsened his COPD.  He has been using Flonase as well as albuterol as needed.  He is not on any daily maintenance inhalers for his COPD.  States that his last exacerbation was early February and has not had symptoms in the past month.  His primary care is at the [place], but did not feel comfortable driving to [place] today due to his shortness of breath.Denies any recent travel, denies any known exposure to COVID-19.  Patient has been remaining at home of recently.   HPI  Past Medical History:  Diagnosis Date  . BPH (benign prostatic hyperplasia)   . COPD (chronic obstructive pulmonary disease) ([place])   . Diabetes mellitus without complication ([place])   . GERD (gastroesophageal reflux disease)   . Hypercholesterolemia   . Hypertension   . Seizures ([place])   . Type II or unspecified type diabetes mellitus without mention of complication, not stated as uncontrolled     Patient Active Problem List   Diagnosis Date Noted  . Vitamin D deficiency 10/08/2017  . COPD (chronic obstructive pulmonary disease) ([place]) 07/08/2017  . Morbid obesity ([place]) 12/22/2015  . Encounter for Medicare annual wellness exam 12/22/2015  . Hyperlipidemia, mixed   . Hypertension   . Diabetes mellitus due to underlying condition with stage 2 chronic kidney disease ([place])   . Seizures ([place])   . BPH (benign prostatic hyperplasia)   .  GERD (gastroesophageal reflux disease)     Past Surgical History:  Procedure Laterality Date  . HAND SURGERY Left 1953  . KNEE ARTHROSCOPY Left 1998       Home Medications    Prior to Admission medications   Medication Sig Start Date End Date Taking? Authorizing Provider  albuterol (PROVENTIL HFA;VENTOLIN HFA) 108 (90 Base) MCG/ACT inhaler Inhale 1-2 puffs into the lungs every 6 (six) hours as needed for wheezing or shortness of breath. 04/19/18   Robyn Haber, MD  aspirin 81 MG chewable tablet Chew 81 mg by mouth daily.    [provider]  atenolol (TENORMIN) 100 MG tablet Take 100 mg by mouth daily.    [provider]  benzonatate (TESSALON) 200 MG capsule Take 1 capsule (200 mg total) by mouth 3 (three) times daily as needed for up to 7 days for cough. 03/16/19 03/23/19  ,  C, PA-C  divalproex (DEPAKOTE ER) 500 MG 24 hr tablet Take 1 tablet (500 mg total) by mouth 2 (two) times daily. 10/11/17   Unk Pinto, MD  doxycycline (VIBRAMYCIN) 100 MG capsule Take 1 capsule (100 mg total) by mouth 2 (two) times daily for 10 days. 03/16/19 03/26/19  ,  C, PA-C  finasteride (PROSCAR) 5 MG tablet Take 5 mg by mouth every evening.    [provider]  fluticasone (FLONASE) 50 MCG/ACT nasal spray Place 2 sprays into both nostrils  daily. Patient taking differently: Place 2 sprays into both nostrils daily as needed for allergies.  12/05/15   Rolene Course, PA-C  furosemide (LASIX) 20 MG tablet Take 1 tablet (20 mg total) by mouth daily. 11/02/18   Katy Apo, NP  glipiZIDE (GLUCOTROL) 10 MG tablet Take 10 mg by mouth 2 (two) times daily before a meal.    [provider]  guaifenesin (ROBITUSSIN) 100 MG/5ML syrup Take 5-10 mLs (100-200 mg total) by mouth every 4 (four) hours as needed for cough. 12/21/17   Kirichenko, Tatyana, PA-C  ipratropium-albuterol (DUONEB) 0.5-2.5 (3) MG/3ML SOLN Take 3 mLs by nebulization every 6 (six) hours  as needed (wheezing, shortness of breath). 03/16/19   ,  C, PA-C  lisinopril (PRINIVIL,ZESTRIL) 40 MG tablet Take 40 mg by mouth daily.    [provider]  MAGNESIUM PO Take 420 mg by mouth 2 (two) times daily.    [provider]  metFORMIN (GLUCOPHAGE XR) 500 MG 24 hr tablet Take 2 tablets 2 x / day for Diabetes Patient taking differently: Take 500 mg by mouth 3 (three) times daily.  10/02/17 10/02/18  Unk Pinto, MD  Multiple Vitamin (MULTIVITAMIN) tablet Take 1 tablet by mouth daily.    [provider]  Multiple Vitamins-Minerals (ICAPS AREDS FORMULA PO) Take 1 tablet by mouth daily.    [provider]  NON FORMULARY A cholesterol medicine beginning with ":r"    [provider]  pravastatin (PRAVACHOL) 80 MG tablet Take 80 mg by mouth daily.    [provider]  predniSONE (DELTASONE) 20 MG tablet Take 2 tablets (40 mg total) by mouth daily with breakfast for 5 days. 03/16/19 03/21/19  , Elesa Hacker, PA-C    Family History Family History  Problem Relation Age of Onset  . CVA Mother   . Thyroid disease Mother   . Heart disease Father   . Diabetes Sister   . Heart disease Sister   . Diabetes Brother   . Heart disease Brother   . Epilepsy Brother   . Schizophrenia Brother     Social History Social History   Tobacco Use  . Smoking status: Former Smoker    Last attempt to quit: 12/17/2013    Years since quitting: 5.2  . Smokeless tobacco: Never Used  Substance Use Topics  . Alcohol use: No    Alcohol/week: 0.0 standard drinks  . Drug use: No     Allergies   Penicillins and Latex   Review of Systems Review of Systems  Constitutional: Negative for activity change, appetite change, chills, fatigue and fever.  HENT: Positive for congestion and rhinorrhea. Negative for ear pain, sinus pressure, sore throat and trouble swallowing.   Eyes: Negative for discharge and redness.  Respiratory: Positive for  cough, shortness of breath and wheezing. Negative for chest tightness.   Cardiovascular: Negative for chest pain.  Gastrointestinal: Negative for abdominal pain, diarrhea, nausea and vomiting.  Musculoskeletal: Negative for myalgias.  Skin: Negative for rash.  Neurological: Negative for dizziness, light-headedness and headaches.     Physical Exam Triage Vital Signs ED Triage Vitals  Enc Vitals Group     BP 03/16/19 0920 (!) 190/67     Pulse Rate 03/16/19 0920 90     Resp 03/16/19 0920 (!) 26     Temp 03/16/19 0920 98 F (36.7 C)     Temp Source 03/16/19 0920 Oral     SpO2 03/16/19 0920 95 %     Weight --  Height --      Head Circumference --      Peak Flow --      Pain Score 03/16/19 0922 0     Pain Loc --      Pain Edu? --      Excl. in [place]? --    No data found.  Updated Vital Signs BP (!) 190/67 (BP Location: Left Arm)   Pulse 90   Temp 98 F (36.7 C) (Oral)   Resp (!) 26   SpO2 95%   Visual Acuity Right Eye Distance:   Left Eye Distance:   Bilateral Distance:    Right Eye Near:   Left Eye Near:    Bilateral Near:     Physical Exam Vitals signs and nursing note reviewed.  Constitutional:      General: He is not in acute distress.    Appearance: Normal appearance. He is well-developed.  HENT:     Head: Normocephalic and atraumatic.     Ears:     Comments: Bilateral ears without tenderness to palpation of external auricle, tragus and mastoid, EAC's without erythema or swelling, TM's with good bony landmarks and cone of light. Non erythematous.     Mouth/Throat:     Comments: Oral mucosa pink and appears dry, no tonsillar enlargement or exudate. Posterior pharynx patent and nonerythematous, no uvula deviation or swelling. Normal phonation. Eyes:     Conjunctiva/sclera: Conjunctivae normal.  Neck:     Musculoskeletal: Neck supple.  Cardiovascular:     Rate and Rhythm: Normal rate and regular rhythm.     Heart sounds: No murmur.  Pulmonary:      Effort: No respiratory distress.     Breath sounds: Wheezing present.     Comments: Slight increase in effort, but no accessory muscle use, audible wheezing prior to auscultation, inspiratory and expiratory wheezing throughout bilateral lung fields Abdominal:     Palpations: Abdomen is soft.     Tenderness: There is no abdominal tenderness.  Skin:    General: Skin is warm and dry.  Neurological:     Mental Status: He is alert.      UC Treatments / Results  Labs (all labs ordered are listed, but only abnormal results are displayed) Labs Reviewed - No data to display  EKG None  Radiology Dg Chest 2 View  Result Date: 03/16/2019 CLINICAL DATA:  Cough, wheezing and shortness of breath 4-5 days. EXAM: CHEST - 2 VIEW COMPARISON:  PA and lateral chest 12/21/2017 and 08/29/2017. FINDINGS: The lungs are clear. Heart size is normal. No pneumothorax or pleural fluid. No acute or focal bony abnormality. IMPRESSION: Negative chest. Electronically Signed   By: Inge Rise M.D.   On: 03/16/2019 10:00    Procedures Procedures (including critical care time)  Medications Ordered in UC Medications  albuterol (PROVENTIL HFA;VENTOLIN HFA) 108 (90 Base) MCG/ACT inhaler 3 puff (has no administration in time range)    Initial Impression / Assessment and Plan / UC Course  I have reviewed the triage vital signs and the nursing notes.  Pertinent labs & imaging results that were available during my care of the patient were reviewed by me and considered in my medical decision making (see chart for details).  Clinical Course as of Mar 15 1014  Mon Mar 16, 2019  1002 Blood pressure rechecked 154/71   [HW]    Clinical Course User Index [HW] , Badin C, PA-C    Chest x-ray negative for pneumonia, vital signs stable,  no hypoxia.  Blood pressure improved on second recheck.  Patient likely with exacerbation of COPD, will provide albuterol inhaler refill in clinic prior to discharge.   Discharging home with nebulizer machine as well as duo nebs to do at home as currently avoiding in clinic nebulizers due to COVID-19.  Advised to start using these and monitor for improvement of shortness of breath and wheezing.  Will also initiate on doxycycline and prednisone.  Feel benefit of initiating prednisone outweighs risk of possible complications with COVID at this time, he seems low risk for having COVID-19 currently, lacking fevers, body aches and other viral symptoms.  Advised if not having any improvement in the next 24 to 48 hours with the initiation of duo nebs, continuing albuterol, prednisone and doxy to follow-up in emergency room.Discussed strict return precautions. Patient verbalized understanding and is agreeable with plan.  Final Clinical Impressions(s) / UC Diagnoses   Final diagnoses:  COPD exacerbation ([place])     Discharge Instructions     Please continue to use albuterol inhaler as needed for shortness of breath and wheezing Please begin using DuoNeb nebulizing breathing treatments at home to further help with shortness of breath and wheezing as needed Begin doxycycline twice daily over the next 10 days Prednisone daily for 5 days with breakfast May use Tessalon as needed every 8 hours for cough Continue Flonase  If your shortness of breath and wheezing do not improve with using the breathing treatments at home and the above recommendations in the next 24 to 48 hours please follow-up in the emergency room.  Please also follow-up if you develop fevers, worsening symptoms, chest discomfort, persistent symptoms    ED Prescriptions    Medication Sig Dispense Auth. Provider   ipratropium-albuterol (DUONEB) 0.5-2.5 (3) MG/3ML SOLN Take 3 mLs by nebulization every 6 (six) hours as needed (wheezing, shortness of breath). 360 mL ,  C, PA-C   predniSONE (DELTASONE) 20 MG tablet Take 2 tablets (40 mg total) by mouth daily with breakfast for 5 days. 10 tablet  ,  C, PA-C   doxycycline (VIBRAMYCIN) 100 MG capsule Take 1 capsule (100 mg total) by mouth 2 (two) times daily for 10 days. 20 capsule ,  C, PA-C   benzonatate (TESSALON) 200 MG capsule Take 1 capsule (200 mg total) by mouth 3 (three) times daily as needed for up to 7 days for cough. 28 capsule ,  C, PA-C     Controlled Substance Prescriptions [place] Controlled Substance Registry consulted? Not Applicable   Janith Lima, [place] 03/16/19 1015

## 2019-04-21 ENCOUNTER — Ambulatory Visit (HOSPITAL_COMMUNITY)
Admission: EM | Admit: 2019-04-21 | Discharge: 2019-04-21 | Disposition: A | Discharge status: Home / Self Care | Payer: Medicare Other

## 2019-04-21 ENCOUNTER — Other Ambulatory Visit: Payer: Self-pay

## 2019-04-21 ENCOUNTER — Encounter (HOSPITAL_COMMUNITY): Payer: Self-pay

## 2019-04-21 DIAGNOSIS — J441 Chronic obstructive pulmonary disease with (acute) exacerbation: Secondary | ICD-10-CM

## 2019-04-21 DIAGNOSIS — I10 Essential (primary) hypertension: Secondary | ICD-10-CM

## 2019-04-21 DIAGNOSIS — R059 Cough, unspecified: Secondary | ICD-10-CM

## 2019-04-21 DIAGNOSIS — R0602 Shortness of breath: Secondary | ICD-10-CM

## 2019-04-21 DIAGNOSIS — R05 Cough: Secondary | ICD-10-CM

## 2019-04-21 MED ORDER — METHYLPREDNISOLONE SODIUM SUCC 125 MG IJ SOLR
80.0000 mg | Freq: Once | INTRAMUSCULAR | Status: AC
  Administered 2019-04-21: 09:00:00 125 mg via INTRAMUSCULAR

## 2019-04-21 MED ORDER — PREDNISONE 20 MG PO TABS: 20.0000 mg | ORAL_TABLET | Freq: Two times a day (BID) | ORAL | 0 refills | Status: AC

## 2019-04-21 MED ORDER — HYDROCODONE-HOMATROPINE 5-1.5 MG/5ML PO SYRP: 5.0000 mL | ORAL_SOLUTION | Freq: Every evening | ORAL | 0 refills | Status: DC | PRN

## 2019-04-21 MED ORDER — METHYLPREDNISOLONE SODIUM SUCC 125 MG IJ SOLR
INTRAMUSCULAR | Status: AC
Start: 2019-04-21 — End: ?
  Filled 2019-04-21: qty 2

## 2019-04-21 NOTE — ED Provider Notes (Signed)
[place]   [phone] 04/21/19 Arrival Time: 3500  Cc: SOB, cough, and wheezing  SUBJECTIVE:  Trevor [name] is a 73 y.o. male who presents with SOB, and wheezing x 1 week and dry cough x 1 day.  Denies sick exposure to COVID, flu or strep.  Denies recent travel.  Has tried breathing treatments at home without relief.  Symptoms are made worse at night.  Reports previous symptoms in the past that improved with steroids.   Denies fever, chills, fatigue, night sweats, sinus pain, rhinorrhea, sore throat, chest pain, nausea, changes in bowel or bladder habits.    ROS: As per HPI.  Past Medical History:  Diagnosis Date  . BPH (benign prostatic hyperplasia)   . COPD (chronic obstructive pulmonary disease) ([place])   . Diabetes mellitus without complication ([place])   . GERD (gastroesophageal reflux disease)   . Hypercholesterolemia   . Hypertension   . Seizures ([place])   . Type II or unspecified type diabetes mellitus without mention of complication, not stated as uncontrolled    Past Surgical History:  Procedure Laterality Date  . HAND SURGERY Left 1953  . KNEE ARTHROSCOPY Left 1998   Allergies  Allergen Reactions  . Penicillins Other (See Comments)    Unknown reaction  . Latex Hives and Rash   No current facility-administered medications on file prior to encounter.    Current Outpatient Medications on File Prior to Encounter  Medication Sig Dispense Refill  . amLODipine (NORVASC) 5 MG tablet Take 5 mg by mouth daily.    Marland Kitchen albuterol (PROVENTIL HFA;VENTOLIN HFA) 108 (90 Base) MCG/ACT inhaler Inhale 1-2 puffs into the lungs every 6 (six) hours as needed for wheezing or shortness of breath. 1 Inhaler 11  . aspirin 81 MG chewable tablet Chew 81 mg by mouth daily.    . divalproex (DEPAKOTE ER) 500 MG 24 hr tablet Take 1 tablet (500 mg total) by mouth 2 (two) times daily. 180 tablet 1  . finasteride (PROSCAR) 5 MG tablet Take 5 mg by mouth every evening.    . fluticasone  (FLONASE) 50 MCG/ACT nasal spray Place 2 sprays into both nostrils daily. (Patient taking differently: Place 2 sprays into both nostrils daily as needed for allergies. ) 16 g 0  . furosemide (LASIX) 20 MG tablet Take 1 tablet (20 mg total) by mouth daily. 5 tablet 0  . glipiZIDE (GLUCOTROL) 10 MG tablet Take 10 mg by mouth 2 (two) times daily before a meal.    . guaifenesin (ROBITUSSIN) 100 MG/5ML syrup Take 5-10 mLs (100-200 mg total) by mouth every 4 (four) hours as needed for cough. 60 mL 0  . ipratropium-albuterol (DUONEB) 0.5-2.5 (3) MG/3ML SOLN Take 3 mLs by nebulization every 6 (six) hours as needed (wheezing, shortness of breath). 360 mL 0  . lisinopril (PRINIVIL,ZESTRIL) 40 MG tablet Take 40 mg by mouth daily.    Marland Kitchen MAGNESIUM PO Take 420 mg by mouth 2 (two) times daily.    . metFORMIN (GLUCOPHAGE XR) 500 MG 24 hr tablet Take 2 tablets 2 x / day for Diabetes (Patient taking differently: Take 500 mg by mouth 3 (three) times daily. ) 360 tablet 3  . Multiple Vitamin (MULTIVITAMIN) tablet Take 1 tablet by mouth daily.    . Multiple Vitamins-Minerals (ICAPS AREDS FORMULA PO) Take 1 tablet by mouth daily.    . NON FORMULARY A cholesterol medicine beginning with ":r"    . pravastatin (PRAVACHOL) 80 MG tablet Take 80 mg by mouth daily.  Social History   Socioeconomic History  . Marital status: Married    Spouse name: Not on file  . Number of children: Not on file  . Years of education: Not on file  . Highest education level: Not on file  Occupational History  . Not on file  Social Needs  . Financial resource strain: Not on file  . Food insecurity:    Worry: Not on file    Inability: Not on file  . Transportation needs:    Medical: Not on file    Non-medical: Not on file  Tobacco Use  . Smoking status: Former Smoker    Last attempt to quit: 12/17/2013    Years since quitting: 5.3  . Smokeless tobacco: Never Used  Substance and Sexual Activity  . Alcohol use: No    Alcohol/week:  0.0 standard drinks  . Drug use: No  . Sexual activity: Not on file  Lifestyle  . Physical activity:    Days per week: Not on file    Minutes per session: Not on file  . Stress: Not on file  Relationships  . Social connections:    Talks on phone: Not on file    Gets together: Not on file    Attends religious service: Not on file    Active member of club or organization: Not on file    Attends meetings of clubs or organizations: Not on file    Relationship status: Not on file  . Intimate partner violence:    Fear of current or ex partner: Not on file    Emotionally abused: Not on file    Physically abused: Not on file    Forced sexual activity: Not on file  Other Topics Concern  . Not on file  Social History Narrative  . Not on file   Family History  Problem Relation Age of Onset  . CVA Mother   . Thyroid disease Mother   . Heart disease Father   . Diabetes Sister   . Heart disease Sister   . Diabetes Brother   . Heart disease Brother   . Epilepsy Brother   . Schizophrenia Brother      OBJECTIVE:  Vitals:   04/21/19 0818  BP: (!) 149/71  Pulse: 93  Temp: 97.8 F (36.6 C)  SpO2: 95%     General appearance: Alert, chronically ill appearing, but nontoxic; speaking in full sentences without difficulty HEENT:NCAT; Ears: EACs clear, TMs pearly gray; Eyes: PERRL.  EOM grossly intact. Nose: nares patent without rhinorrhea; Throat: tonsils nonerythematous or enlarged, uvula midline  Neck: supple without LAD Lungs: inspiratory and expiratory wheezes heard throughout Heart: regular rate and rhythm.  Radial pulses 2+ symmetrical bilaterally Skin: warm and dry Psychological: alert and cooperative; normal mood and affect   ASSESSMENT & PLAN:  1. COPD exacerbation ([place])   2. Shortness of breath   3. Cough     Meds ordered this encounter  Medications  . methylPREDNISolone sodium succinate (SOLU-MEDROL) 125 mg/2 mL injection 80 mg  . predniSONE (DELTASONE) 20 MG  tablet    Sig: Take 1 tablet (20 mg total) by mouth 2 (two) times daily with a meal for 5 days.    Dispense:  10 tablet    Refill:  0    Order Specific Question:   Supervising Provider    Answer:   Raylene Everts [[id number]]  . HYDROcodone-homatropine (HYCODAN) 5-1.5 MG/5ML syrup    Sig: Take 5 mLs by mouth at bedtime as  needed for cough.    Dispense:  30 mL    Refill:  0    Order Specific Question:   Supervising Provider    Answer:   Raylene Everts [[id number]]    Steroid shot given in office Use inhaler and nebulizer machine at home as needed for shortness of breath and/or wheezing Get plenty of rest and push fluids Prednisone prescribed.  Take as directed and to completion Hycodan cough syrup prescribed.  Use at night for nighttime relief of cough Follow up with PCP for recheck this week or next week, and/or if symptoms persists Call or go to ER if you have any new or worsening symptoms such as fever, chills, fatigue, shortness of breath, wheezing, chest pain, nausea, changes in bowel or bladder habits, etc...  Reviewed expectations re: course of current medical issues. Questions answered. Outlined signs and symptoms indicating need for more acute intervention. Patient verbalized understanding. After Visit Summary given.          Stacey Drain Grover, PA-C 04/21/19 [phone]

## 2019-04-21 NOTE — ED Triage Notes (Signed)
Pt began having SOB yesterday , states that he had 2 breathing treatments yesterday with some relief, but states he has worsening  symptoms this morning, pt sob with minimal exertion O2 91%, increase to 95 with rest .

## 2019-04-21 NOTE — Discharge Instructions (Signed)
Steroid shot given in office Use inhaler and nebulizer machine at home as needed for shortness of breath and/or wheezing Get plenty of rest and push fluids Prednisone prescribed.  Take as directed and to completion Hycodan cough syrup prescribed.  Use at night for nighttime relief of cough Follow up with PCP for recheck this week or next week, and/or if symptoms persists Call or go to ER if you have any new or worsening symptoms such as fever, chills, fatigue, shortness of breath, wheezing, chest pain, nausea, changes in bowel or bladder habits, etc..Marland Kitchen

## 2019-06-17 ENCOUNTER — Ambulatory Visit (HOSPITAL_COMMUNITY)
Admission: EM | Admit: 2019-06-17 | Discharge: 2019-06-17 | Disposition: A | Discharge status: Home / Self Care | Payer: Medicare Other | Attending: Internal Medicine | Admitting: Internal Medicine

## 2019-06-17 ENCOUNTER — Encounter (HOSPITAL_COMMUNITY): Payer: Self-pay

## 2019-06-17 ENCOUNTER — Other Ambulatory Visit: Payer: Self-pay

## 2019-06-17 DIAGNOSIS — J441 Chronic obstructive pulmonary disease with (acute) exacerbation: Secondary | ICD-10-CM

## 2019-06-17 DIAGNOSIS — E119 Type 2 diabetes mellitus without complications: Secondary | ICD-10-CM | POA: Diagnosis not present

## 2019-06-17 MED ORDER — METHYLPREDNISOLONE SODIUM SUCC 125 MG IJ SOLR
60.0000 mg | Freq: Once | INTRAMUSCULAR | Status: AC
  Administered 2019-06-17: 60 mg via INTRAMUSCULAR

## 2019-06-17 MED ORDER — PREDNISONE 20 MG PO TABS: 40.0000 mg | ORAL_TABLET | Freq: Every day | ORAL | 0 refills | Status: AC

## 2019-06-17 MED ORDER — BENZONATATE 100 MG PO CAPS
100.0000 mg | ORAL_CAPSULE | Freq: Three times a day (TID) | ORAL | 0 refills | Status: DC
Start: 2019-06-17 — End: 2020-02-29

## 2019-06-17 MED ORDER — BUDESONIDE-FORMOTEROL FUMARATE 80-4.5 MCG/ACT IN AERO: 2.0000 | INHALATION_SPRAY | Freq: Two times a day (BID) | RESPIRATORY_TRACT | 1 refills | Status: DC

## 2019-06-17 MED ORDER — METHYLPREDNISOLONE SODIUM SUCC 125 MG IJ SOLR
INTRAMUSCULAR | Status: AC
  Filled 2019-06-17: qty 2

## 2019-06-17 MED ORDER — AZITHROMYCIN 250 MG PO TABS: ORAL_TABLET | ORAL | 0 refills | Status: DC

## 2019-06-17 NOTE — ED Triage Notes (Signed)
Patient presents to Urgent Care with complaints of recurrent cough and wheezing. Patient reports he has been taking multiple otc and prescription meds for his breathing but they don't fix the problem for good.

## 2019-06-17 NOTE — ED Provider Notes (Addendum)
[place]    CSN: [id number] Arrival date & time: 06/17/19  1207     History   Chief Complaint Chief Complaint  Patient presents with   Cough   Wheezing    HPI TERUO STILLEY is a 73 y.o. male history of COPD uncontrolled, diabetes mellitus type 2-controlled comes to urgent care with complaints of shortness of breath, wheezing and nonproductive cough of 1 day duration.  Patient says symptoms started yesterday and is been bothersome overnight.  He used his albuterol nebulizer treatments at home with no improvement.  He denies any fever, chills or chest pain.  No nausea or vomiting.    Past Medical History:  Diagnosis Date   BPH (benign prostatic hyperplasia)    COPD (chronic obstructive pulmonary disease) (HCC)    Diabetes mellitus without complication (HCC)    GERD (gastroesophageal reflux disease)    Hypercholesterolemia    Hypertension    Seizures ([place])    Type II or unspecified type diabetes mellitus without mention of complication, not stated as uncontrolled     Patient Active Problem List   Diagnosis Date Noted   Vitamin D deficiency 10/08/2017   COPD (chronic obstructive pulmonary disease) ([place]) 07/08/2017   Morbid obesity ([place]) 12/22/2015   Encounter for Medicare annual wellness exam 12/22/2015   Hyperlipidemia, mixed    Hypertension    Diabetes mellitus due to underlying condition with stage 2 chronic kidney disease ([place])    Seizures ([place])    BPH (benign prostatic hyperplasia)    GERD (gastroesophageal reflux disease)     Past Surgical History:  Procedure Laterality Date   HAND SURGERY Left 1953   KNEE ARTHROSCOPY Left 1998       Home Medications    Prior to Admission medications   Medication Sig Start Date End Date Taking? Authorizing Provider  fluticasone (FLONASE) 50 MCG/ACT nasal spray Place 2 sprays into both nostrils daily. Patient taking differently: Place 2 sprays into both nostrils daily as needed for  allergies.  12/05/15  Yes Rolene Course, PA-C  albuterol (PROVENTIL HFA;VENTOLIN HFA) 108 (90 Base) MCG/ACT inhaler Inhale 1-2 puffs into the lungs every 6 (six) hours as needed for wheezing or shortness of breath. 04/19/18   Robyn Haber, MD  amLODipine (NORVASC) 5 MG tablet Take 5 mg by mouth daily.    [provider]  aspirin 81 MG chewable tablet Chew 81 mg by mouth daily.    [provider]  azithromycin (ZITHROMAX Z-PAK) 250 MG tablet Please take 2 tablets on Day 1 and then 1 tablet orally Day 2-5 06/17/19   Dearis Danis, Myrene Galas, MD  benzonatate (TESSALON) 100 MG capsule Take 1 capsule (100 mg total) by mouth every 8 (eight) hours. 06/17/19   Chase Picket, MD  budesonide-formoterol (SYMBICORT) 80-4.5 MCG/ACT inhaler Inhale 2 puffs into the lungs 2 (two) times a day. 06/17/19   Chase Picket, MD  divalproex (DEPAKOTE ER) 500 MG 24 hr tablet Take 1 tablet (500 mg total) by mouth 2 (two) times daily. 10/11/17   Unk Pinto, MD  finasteride (PROSCAR) 5 MG tablet Take 5 mg by mouth every evening.    [provider]  furosemide (LASIX) 20 MG tablet Take 1 tablet (20 mg total) by mouth daily. 11/02/18   Katy Apo, NP  glipiZIDE (GLUCOTROL) 10 MG tablet Take 10 mg by mouth 2 (two) times daily before a meal.    [provider]  guaifenesin (ROBITUSSIN) 100 MG/5ML syrup Take 5-10 mLs (  100-200 mg total) by mouth every 4 (four) hours as needed for cough. 12/21/17   Kirichenko, Tatyana, PA-C  HYDROcodone-homatropine (HYCODAN) 5-1.5 MG/5ML syrup Take 5 mLs by mouth at bedtime as needed for cough. 04/21/19   Wurst, [place], PA-C  ipratropium-albuterol (DUONEB) 0.5-2.5 (3) MG/3ML SOLN Take 3 mLs by nebulization every 6 (six) hours as needed (wheezing, shortness of breath). 03/16/19   Wieters, Hallie C, PA-C  lisinopril (PRINIVIL,ZESTRIL) 40 MG tablet Take 40 mg by mouth daily.    [provider]  MAGNESIUM PO Take 420 mg by mouth 2 (two) times daily.     [provider]  metFORMIN (GLUCOPHAGE XR) 500 MG 24 hr tablet Take 2 tablets 2 x / day for Diabetes Patient taking differently: Take 500 mg by mouth 3 (three) times daily.  10/02/17 10/02/18  Unk Pinto, MD  Multiple Vitamin (MULTIVITAMIN) tablet Take 1 tablet by mouth daily.    [provider]  Multiple Vitamins-Minerals (ICAPS AREDS FORMULA PO) Take 1 tablet by mouth daily.    [provider]  NON FORMULARY A cholesterol medicine beginning with ":r"    [provider]  pravastatin (PRAVACHOL) 80 MG tablet Take 80 mg by mouth daily.    [provider]  predniSONE (DELTASONE) 20 MG tablet Take 2 tablets (40 mg total) by mouth daily for 5 days. 06/17/19 06/22/19  Chase Picket, MD    Family History Family History  Problem Relation Age of Onset   CVA Mother    Thyroid disease Mother    Heart disease Father    Diabetes Sister    Heart disease Sister    Diabetes Brother    Heart disease Brother    Epilepsy Brother    Schizophrenia Brother     Social History Social History   Tobacco Use   Smoking status: Former Smoker    Quit date: 12/17/2013    Years since quitting: 5.5   Smokeless tobacco: Never Used  Substance Use Topics   Alcohol use: No    Alcohol/week: 0.0 standard drinks   Drug use: No     Allergies   Penicillins and Latex   Review of Systems Review of Systems  Constitutional: Negative for activity change, appetite change, chills, fatigue and fever.  HENT: Negative for sinus pressure, sinus pain, sneezing and sore throat.   Eyes: Negative.   Respiratory: Positive for cough, shortness of breath and wheezing. Negative for chest tightness.   Cardiovascular: Negative.   Gastrointestinal: Negative.   Genitourinary: Negative.   Neurological: Negative for dizziness, weakness, light-headedness and headaches.  Psychiatric/Behavioral: Negative.      Physical Exam Triage Vital Signs ED Triage Vitals  Enc  Vitals Group     BP 06/17/19 1230 110/71     Pulse Rate 06/17/19 1230 93     Resp 06/17/19 1230 19     Temp 06/17/19 1230 97.9 F (36.6 C)     Temp Source 06/17/19 1230 Oral     SpO2 06/17/19 1230 97 %     Weight --      Height --      Head Circumference --      Peak Flow --      Pain Score 06/17/19 1227 0     Pain Loc --      Pain Edu? --      Excl. in [place]? --    No data found.  Updated Vital Signs BP 110/71 (BP Location: Right Arm)    Pulse 93  Temp 97.9 F (36.6 C) (Oral)    Resp 19    SpO2 97%   Visual Acuity Right Eye Distance:   Left Eye Distance:   Bilateral Distance:    Right Eye Near:   Left Eye Near:    Bilateral Near:     Physical Exam Constitutional:      General: He is not in acute distress.    Appearance: He is ill-appearing. He is not toxic-appearing.  HENT:     Right Ear: Tympanic membrane normal.     Left Ear: Tympanic membrane normal.     Mouth/Throat:     Mouth: Mucous membranes are moist.     Pharynx: No oropharyngeal exudate or posterior oropharyngeal erythema.  Cardiovascular:     Rate and Rhythm: Normal rate and regular rhythm.     Pulses: Normal pulses.     Heart sounds: Normal heart sounds.  Pulmonary:     Effort: No respiratory distress.     Breath sounds: Wheezing present. No rhonchi or rales.  Abdominal:     General: Bowel sounds are normal.     Palpations: Abdomen is soft.  Musculoskeletal: Normal range of motion.  Skin:    General: Skin is warm and dry.     Capillary Refill: Capillary refill takes less than 2 seconds.  Neurological:     General: No focal deficit present.     Mental Status: He is alert and oriented to person, place, and time. Mental status is at baseline.      UC Treatments / Results  Labs (all labs ordered are listed, but only abnormal results are displayed) Labs Reviewed - No data to display  EKG   Radiology No results found.  Procedures Procedures (including critical care time)  Medications  Ordered in UC Medications  methylPREDNISolone sodium succinate (SOLU-MEDROL) 125 mg/2 mL injection 60 mg (60 mg Intramuscular Given 06/17/19 1316)  methylPREDNISolone sodium succinate (SOLU-MEDROL) 125 mg/2 mL injection (has no administration in time range)    Initial Impression / Assessment and Plan / UC Course  I have reviewed the triage vital signs and the nursing notes.  Pertinent labs & imaging results that were available during my care of the patient were reviewed by me and considered in my medical decision making (see chart for details).     1.  COPD with acute exacerbation: Add Symbicort to albuterol inhalers Zithromax 500 mg day 1 followed by 250 mg a day 2-5 Prednisone 40 mg orally daily for 5 days Tessalon Perles Methylprednisolone 60 mg IM x1 dose If patient's symptoms worsen, he is advised to come to urgent care to be reevaluated.  2.  Diabetes mellitus type 2: Patient's blood sugars will be elevated because of steroids. Frequent blood sugar checks recommended. Final Clinical Impressions(s) / UC Diagnoses   Final diagnoses:  COPD exacerbation [facility])   Discharge Instructions   None    ED Prescriptions    Medication Sig Dispense Auth. Provider   azithromycin (ZITHROMAX Z-PAK) 250 MG tablet Please take 2 tablets on Day 1 and then 1 tablet orally Day 2-5 6 tablet Amari Burnsworth, Myrene Galas, MD   predniSONE (DELTASONE) 20 MG tablet Take 2 tablets (40 mg total) by mouth daily for 5 days. 10 tablet Zahid Carneiro, Myrene Galas, MD   budesonide-formoterol [facility]) 80-4.5 MCG/ACT inhaler Inhale 2 puffs into the lungs 2 (two) times a day. 1 Inhaler Fatuma Dowers, Myrene Galas, MD   benzonatate (TESSALON) 100 MG capsule Take 1 capsule (100 mg total) by mouth every  8 (eight) hours. 21 capsule Lafern Brinkley, Myrene Galas, MD     Controlled Substance Prescriptions [place] Controlled Substance Registry consulted? No   Chase Picket, MD 06/17/19 [id number]    Chase Picket, MD 06/17/19 [phone]

## 2019-07-20 ENCOUNTER — Other Ambulatory Visit: Payer: Self-pay

## 2019-09-14 ENCOUNTER — Ambulatory Visit (HOSPITAL_COMMUNITY)
Admission: EM | Admit: 2019-09-14 | Discharge: 2019-09-14 | Disposition: A | Discharge status: Home / Self Care | Payer: Medicare Other | Attending: Emergency Medicine | Admitting: Emergency Medicine

## 2019-09-14 ENCOUNTER — Encounter (HOSPITAL_COMMUNITY): Payer: Self-pay

## 2019-09-14 ENCOUNTER — Other Ambulatory Visit: Payer: Self-pay

## 2019-09-14 DIAGNOSIS — Z20828 Contact with and (suspected) exposure to other viral communicable diseases: Secondary | ICD-10-CM | POA: Diagnosis not present

## 2019-09-14 DIAGNOSIS — Z79899 Other long term (current) drug therapy: Secondary | ICD-10-CM | POA: Insufficient documentation

## 2019-09-14 DIAGNOSIS — E782 Mixed hyperlipidemia: Secondary | ICD-10-CM | POA: Diagnosis not present

## 2019-09-14 DIAGNOSIS — Z87891 Personal history of nicotine dependence: Secondary | ICD-10-CM | POA: Diagnosis not present

## 2019-09-14 DIAGNOSIS — J441 Chronic obstructive pulmonary disease with (acute) exacerbation: Secondary | ICD-10-CM | POA: Diagnosis not present

## 2019-09-14 DIAGNOSIS — I129 Hypertensive chronic kidney disease with stage 1 through stage 4 chronic kidney disease, or unspecified chronic kidney disease: Secondary | ICD-10-CM | POA: Insufficient documentation

## 2019-09-14 DIAGNOSIS — K219 Gastro-esophageal reflux disease without esophagitis: Secondary | ICD-10-CM | POA: Diagnosis not present

## 2019-09-14 DIAGNOSIS — Z7982 Long term (current) use of aspirin: Secondary | ICD-10-CM | POA: Diagnosis not present

## 2019-09-14 DIAGNOSIS — E1122 Type 2 diabetes mellitus with diabetic chronic kidney disease: Secondary | ICD-10-CM | POA: Insufficient documentation

## 2019-09-14 DIAGNOSIS — N182 Chronic kidney disease, stage 2 (mild): Secondary | ICD-10-CM | POA: Diagnosis not present

## 2019-09-14 DIAGNOSIS — Z8249 Family history of ischemic heart disease and other diseases of the circulatory system: Secondary | ICD-10-CM | POA: Diagnosis not present

## 2019-09-14 DIAGNOSIS — Z7984 Long term (current) use of oral hypoglycemic drugs: Secondary | ICD-10-CM | POA: Diagnosis not present

## 2019-09-14 MED ORDER — PREDNISONE 20 MG PO TABS: 40.0000 mg | ORAL_TABLET | Freq: Every day | ORAL | 0 refills | Status: AC

## 2019-09-14 MED ORDER — DM-GUAIFENESIN ER 30-600 MG PO TB12: 1.0000 | ORAL_TABLET | Freq: Two times a day (BID) | ORAL | 0 refills | Status: DC

## 2019-09-14 MED ORDER — METHYLPREDNISOLONE SODIUM SUCC 125 MG IJ SOLR
80.0000 mg | Freq: Once | INTRAMUSCULAR | Status: AC
  Administered 2019-09-14: 11:00:00 80 mg via INTRAMUSCULAR

## 2019-09-14 MED ORDER — METHYLPREDNISOLONE SODIUM SUCC 125 MG IJ SOLR
INTRAMUSCULAR | Status: AC
  Filled 2019-09-14: qty 2

## 2019-09-14 NOTE — ED Triage Notes (Signed)
Patient presents to Urgent Care with complaints of shortness of breath and cough, recurrent since last night while he was sleeping. Patient reports he does well during the day but his symptoms get bad at night when he lays down. Pt denies cardiac hx.

## 2019-09-14 NOTE — Discharge Instructions (Signed)
COVID swab pending We gave you solumedrol May continue with prednisone daily at home if continuing to have wheezing/chest congestion Continue albuterol as needed Flonase for congestion Mucinex dm to further help with congestion/cough  Follow up if not resolving or worsneing

## 2019-09-14 NOTE — ED Notes (Signed)
Patient ambulatory to bathroom with steady gait at this time, pt refused assistance from staff.

## 2019-09-14 NOTE — ED Provider Notes (Signed)
[place]    CSN: [id number] Arrival date & time: 09/14/19  [id number]      History   Chief Complaint Chief Complaint  Patient presents with  . Shortness of Breath  . Cough    HPI Trevor [name] is a 73 y.o. male history of BPH, COPD, DM type II, GERD, hypertension, presenting today for evaluation of cough and congestion.  Patient states that last night he started to develop congestion cough and wheezing.  Had some shortness of breath.  Prior to arrival he was able to have a productive cough which improved his wheezing and shortness of breath.  He denies fevers.  He is also use Flonase as he has had some nasal congestion.  Denies fevers chills or body aches.  Denies known exposure to COVID.  States that yesterday he went fishing.  States that he typically responds well to a steroid shot.  HPI  Past Medical History:  Diagnosis Date  . BPH (benign prostatic hyperplasia)   . COPD (chronic obstructive pulmonary disease) ([place])   . Diabetes mellitus without complication ([place])   . GERD (gastroesophageal reflux disease)   . Hypercholesterolemia   . Hypertension   . Seizures ([place])   . Type II or unspecified type diabetes mellitus without mention of complication, not stated as uncontrolled     Patient Active Problem List   Diagnosis Date Noted  . Vitamin D deficiency 10/08/2017  . COPD (chronic obstructive pulmonary disease) ([place]) 07/08/2017  . Morbid obesity ([place]) 12/22/2015  . Encounter for Medicare annual wellness exam 12/22/2015  . Hyperlipidemia, mixed   . Hypertension   . Diabetes mellitus due to underlying condition with stage 2 chronic kidney disease ([place])   . Seizures ([place])   . BPH (benign prostatic hyperplasia)   . GERD (gastroesophageal reflux disease)     Past Surgical History:  Procedure Laterality Date  . HAND SURGERY Left 1953  . KNEE ARTHROSCOPY Left 1998       Home Medications    Prior to Admission medications   Medication Sig Start Date End  Date Taking? Authorizing Provider  albuterol (PROVENTIL HFA;VENTOLIN HFA) 108 (90 Base) MCG/ACT inhaler Inhale 1-2 puffs into the lungs every 6 (six) hours as needed for wheezing or shortness of breath. 04/19/18   Robyn Haber, MD  amLODipine (NORVASC) 5 MG tablet Take 5 mg by mouth daily.    [provider]  aspirin 81 MG chewable tablet Chew 81 mg by mouth daily.    [provider]  azithromycin (ZITHROMAX Z-PAK) 250 MG tablet Please take 2 tablets on Day 1 and then 1 tablet orally Day 2-5 06/17/19   Lamptey, Myrene Galas, MD  benzonatate (TESSALON) 100 MG capsule Take 1 capsule (100 mg total) by mouth every 8 (eight) hours. 06/17/19   Chase Picket, MD  budesonide-formoterol (SYMBICORT) 80-4.5 MCG/ACT inhaler Inhale 2 puffs into the lungs 2 (two) times a day. 06/17/19   Chase Picket, MD  dextromethorphan-guaiFENesin (MUCINEX DM) 30-600 MG 12hr tablet Take 1 tablet by mouth 2 (two) times daily. 09/14/19   Jaimey Franchini C, PA-C  divalproex (DEPAKOTE ER) 500 MG 24 hr tablet Take 1 tablet (500 mg total) by mouth 2 (two) times daily. 10/11/17   Unk Pinto, MD  finasteride (PROSCAR) 5 MG tablet Take 5 mg by mouth every evening.    [provider]  fluticasone (FLONASE) 50 MCG/ACT nasal spray Place 2 sprays into both nostrils daily. Patient taking differently: Place 2 sprays into  both nostrils daily as needed for allergies.  12/05/15   Rolene Course, PA-C  furosemide (LASIX) 20 MG tablet Take 1 tablet (20 mg total) by mouth daily. 11/02/18   Katy Apo, NP  glipiZIDE (GLUCOTROL) 10 MG tablet Take 10 mg by mouth 2 (two) times daily before a meal.    [provider]  guaifenesin (ROBITUSSIN) 100 MG/5ML syrup Take 5-10 mLs (100-200 mg total) by mouth every 4 (four) hours as needed for cough. 12/21/17   Kirichenko, Tatyana, PA-C  HYDROcodone-homatropine (HYCODAN) 5-1.5 MG/5ML syrup Take 5 mLs by mouth at bedtime as needed for cough. 04/21/19   Wurst, [place],  PA-C  ipratropium-albuterol (DUONEB) 0.5-2.5 (3) MG/3ML SOLN Take 3 mLs by nebulization every 6 (six) hours as needed (wheezing, shortness of breath). 03/16/19   Jeanean Hollett C, PA-C  lisinopril (PRINIVIL,ZESTRIL) 40 MG tablet Take 40 mg by mouth daily.    [provider]  MAGNESIUM PO Take 420 mg by mouth 2 (two) times daily.    [provider]  metFORMIN (GLUCOPHAGE XR) 500 MG 24 hr tablet Take 2 tablets 2 x / day for Diabetes Patient taking differently: Take 500 mg by mouth 3 (three) times daily.  10/02/17 10/02/18  Unk Pinto, MD  Multiple Vitamin (MULTIVITAMIN) tablet Take 1 tablet by mouth daily.    [provider]  Multiple Vitamins-Minerals (ICAPS AREDS FORMULA PO) Take 1 tablet by mouth daily.    [provider]  NON FORMULARY A cholesterol medicine beginning with ":r"    [provider]  pravastatin (PRAVACHOL) 80 MG tablet Take 80 mg by mouth daily.    [provider]  predniSONE (DELTASONE) 20 MG tablet Take 2 tablets (40 mg total) by mouth daily with breakfast for 5 days. 09/14/19 09/19/19  Cashtyn Pouliot, Elesa Hacker, PA-C    Family History Family History  Problem Relation Age of Onset  . CVA Mother   . Thyroid disease Mother   . Heart disease Father   . Diabetes Sister   . Heart disease Sister   . Diabetes Brother   . Heart disease Brother   . Epilepsy Brother   . Schizophrenia Brother     Social History Social History   Tobacco Use  . Smoking status: Former Smoker    Quit date: 12/17/2013    Years since quitting: 5.7  . Smokeless tobacco: Never Used  Substance Use Topics  . Alcohol use: No    Alcohol/week: 0.0 standard drinks  . Drug use: No     Allergies   Penicillins and Latex   Review of Systems Review of Systems  Constitutional: Negative for activity change, appetite change, chills, fatigue and fever.  HENT: Positive for congestion and rhinorrhea. Negative for ear pain, sinus pressure, sore throat and  trouble swallowing.   Eyes: Negative for discharge and redness.  Respiratory: Positive for cough, shortness of breath and wheezing. Negative for chest tightness.   Cardiovascular: Negative for chest pain.  Gastrointestinal: Negative for abdominal pain, diarrhea, nausea and vomiting.  Musculoskeletal: Negative for myalgias.  Skin: Negative for rash.  Neurological: Negative for dizziness, light-headedness and headaches.     Physical Exam Triage Vital Signs ED Triage Vitals  Enc Vitals Group     BP 09/14/19 1017 (!) 148/77     Pulse Rate 09/14/19 1017 79     Resp 09/14/19 1017 17     Temp 09/14/19 1017 98.5 F (36.9 C)     Temp Source 09/14/19 1017 Oral  SpO2 09/14/19 1017 97 %     Weight --      Height --      Head Circumference --      Peak Flow --      Pain Score 09/14/19 1016 0     Pain Loc --      Pain Edu? --      Excl. in [place]? --    No data found.  Updated Vital Signs BP (!) 148/77 (BP Location: Left Arm)   Pulse 79   Temp 98.5 F (36.9 C) (Oral)   Resp 17   SpO2 97%   Visual Acuity Right Eye Distance:   Left Eye Distance:   Bilateral Distance:    Right Eye Near:   Left Eye Near:    Bilateral Near:     Physical Exam Vitals signs and nursing note reviewed.  Constitutional:      Appearance: He is well-developed.  HENT:     Head: Normocephalic and atraumatic.     Ears:     Comments: Bilateral ears without tenderness to palpation of external auricle, tragus and mastoid, EAC's without erythema or swelling, TM's with good bony landmarks and cone of light. Non erythematous.     Mouth/Throat:     Comments: Oral mucosa pink and moist, no tonsillar enlargement or exudate. Posterior pharynx patent and nonerythematous, no uvula deviation or swelling. Normal phonation.  Eyes:     Conjunctiva/sclera: Conjunctivae normal.     Comments: Wearing glasses  Neck:     Musculoskeletal: Neck supple.  Cardiovascular:     Rate and Rhythm: Normal rate and regular  rhythm.     Heart sounds: No murmur.  Pulmonary:     Effort: Pulmonary effort is normal. No respiratory distress.     Breath sounds: Normal breath sounds.     Comments: Breathing comfortably at rest, CTABL, no wheezing, rales or other adventitious sounds auscultated Abdominal:     Palpations: Abdomen is soft.     Tenderness: There is no abdominal tenderness.  Skin:    General: Skin is warm and dry.  Neurological:     Mental Status: He is alert.      UC Treatments / Results  Labs (all labs ordered are listed, but only abnormal results are displayed) Labs Reviewed  NOVEL CORONAVIRUS, NAA (HOSP ORDER, SEND-OUT TO REF LAB; TAT 18-24 HRS)    EKG   Radiology No results found.  Procedures Procedures (including critical care time)  Medications Ordered in UC Medications  methylPREDNISolone sodium succinate (SOLU-MEDROL) 125 mg/2 mL injection 80 mg (80 mg Intramuscular Given 09/14/19 1103)  methylPREDNISolone sodium succinate (SOLU-MEDROL) 125 mg/2 mL injection (has no administration in time range)    Initial Impression / Assessment and Plan / UC Course  I have reviewed the triage vital signs and the nursing notes.  Pertinent labs & imaging results that were available during my care of the patient were reviewed by me and considered in my medical decision making (see chart for details).     1 day of symptoms, vital signs stable without fever, tachycardia or hypoxia.  Lungs clear to auscultation today, not actively coughing in room.  Deferring chest x-ray.  Avoiding breathing treatments given current COVID pandemic.  Advised to continue albuterol inhaler at home.  Flonase, may add in Mucinex.  Will provide Solu-Medrol IM today in hopes to prevent COPD exacerbation from worsening.  Continue to monitor.  Advised if he continues to have wheezing/chest congestion he may continue course  of prednisone at home.  Does not seem to warrant antibiotic therapy at this time.Discussed strict  return precautions. Patient verbalized understanding and is agreeable with plan.  Final Clinical Impressions(s) / UC Diagnoses   Final diagnoses:  COPD exacerbation [facility])     Discharge Instructions     COVID swab pending We gave you solumedrol May continue with prednisone daily at home if continuing to have wheezing/chest congestion Continue albuterol as needed Flonase for congestion Mucinex dm to further help with congestion/cough  Follow up if not resolving or worsneing   ED Prescriptions    Medication Sig Dispense Auth. Provider   predniSONE (DELTASONE) 20 MG tablet Take 2 tablets (40 mg total) by mouth daily with breakfast for 5 days. 10 tablet Drue Camera C, PA-C   dextromethorphan-guaiFENesin (MUCINEX DM) 30-600 MG 12hr tablet Take 1 tablet by mouth 2 (two) times daily. 20 tablet Kennesha Brewbaker, [place] C, PA-C     PDMP not reviewed this encounter.   Janith Lima, [place] 09/14/19 1114

## 2019-09-16 LAB — NOVEL CORONAVIRUS, NAA (HOSP ORDER, SEND-OUT TO REF LAB; TAT 18-24 HRS): SARS-CoV-2, NAA: NOT DETECTED

## 2020-01-01 ENCOUNTER — Ambulatory Visit: Payer: Medicare Other | Attending: Internal Medicine

## 2020-01-01 DIAGNOSIS — Z20822 Contact with and (suspected) exposure to covid-19: Secondary | ICD-10-CM | POA: Diagnosis not present

## 2020-01-02 LAB — NOVEL CORONAVIRUS, NAA: SARS-CoV-2, NAA: DETECTED — AB

## 2020-01-03 ENCOUNTER — Telehealth: Payer: Self-pay | Admitting: Nurse Practitioner

## 2020-01-03 ENCOUNTER — Other Ambulatory Visit: Payer: Self-pay | Admitting: Adult Health

## 2020-01-03 DIAGNOSIS — U071 COVID-19: Secondary | ICD-10-CM

## 2020-01-03 NOTE — Progress Notes (Signed)
  I connected by phone with Trevor [name] on 01/03/2020 at 11:30 AM to discuss the potential use of an new treatment for mild to moderate COVID-19 viral infection in non-hospitalized patients.  This patient is a 74 y.o. male that meets the FDA criteria for Emergency Use Authorization of bamlanivimab or casirivimab\imdevimab.  Has a (+) direct SARS-CoV-2 viral test result  Has mild or moderate COVID-19   Is ? 73 years of age and weighs ? 40 kg  Is NOT hospitalized due to COVID-19  Is NOT requiring oxygen therapy or requiring an increase in baseline oxygen flow rate due to COVID-19  Is within 10 days of symptom onset  Has at least one of the high risk factor(s) for progression to severe COVID-19 and/or hospitalization as defined in EUA.  Specific high risk criteria : >/= 73 yo   I have spoken and communicated the following to the patient or parent/caregiver:  1. FDA has authorized the emergency use of bamlanivimab and casirivimab\imdevimab for the treatment of mild to moderate COVID-19 in adults and pediatric patients with positive results of direct SARS-CoV-2 viral testing who are 28 years of age and older weighing at least 40 kg, and who are at high risk for progressing to severe COVID-19 and/or hospitalization.  2. The significant known and potential risks and benefits of bamlanivimab and casirivimab\imdevimab, and the extent to which such potential risks and benefits are unknown.  3. Information on available alternative treatments and the risks and benefits of those alternatives, including clinical trials.  4. Patients treated with bamlanivimab and casirivimab\imdevimab should continue to self-isolate and use infection control measures (e.g., wear mask, isolate, social distance, avoid sharing personal items, clean and disinfect "high touch" surfaces, and frequent handwashing) according to CDC guidelines.   5. The patient or parent/caregiver has the option to accept or refuse  bamlanivimab or casirivimab\imdevimab .  After reviewing this information with the patient, The patient agreed to proceed with receiving the bamlanimivab infusion and will be provided a copy of the Fact sheet prior to receiving the infusion.Lynelle Smoke Myrakle Wingler 01/03/2020 11:30 AM

## 2020-01-03 NOTE — Telephone Encounter (Signed)
Called to Discuss with patient about Covid symptoms and the use of bamlanivimab, a monoclonal antibody infusion for those with mild to moderate Covid symptoms and at a high risk of hospitalization.     Pt is qualified for this infusion at the [facility] infusion center due to co-morbid conditions and/or a member of an at-risk group.     Unable to reach pt. Message left to return call.

## 2020-01-04 IMAGING — DX DG CHEST 2V
2 series · 2 of 2 positions shown · non-contrast
Comparison: Prior radiograph from 08/29/2017.

CLINICAL DATA: Initial evaluation for [place] chest pain.

EXAM:
CHEST  2 VIEW

[chest pa]
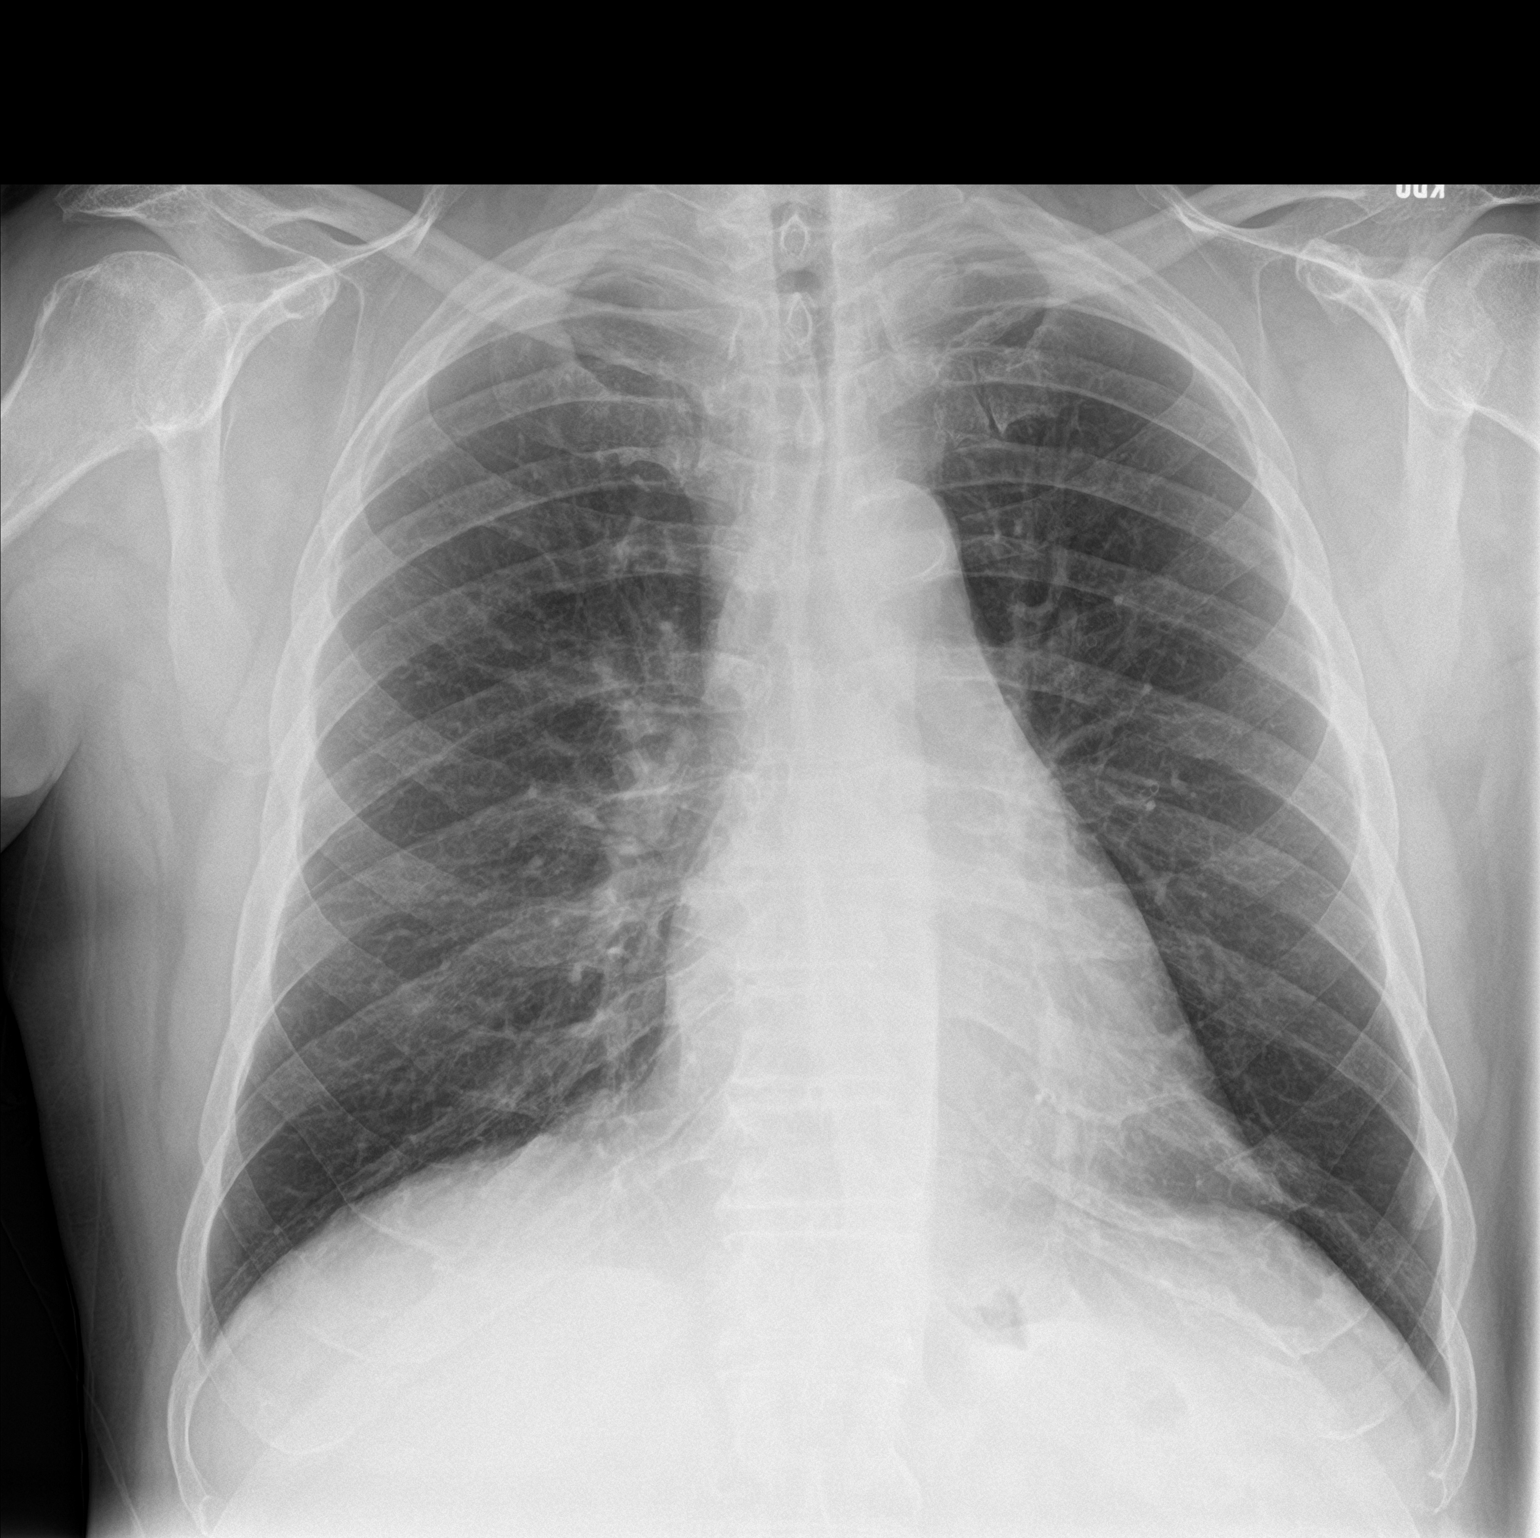

[chest lat]
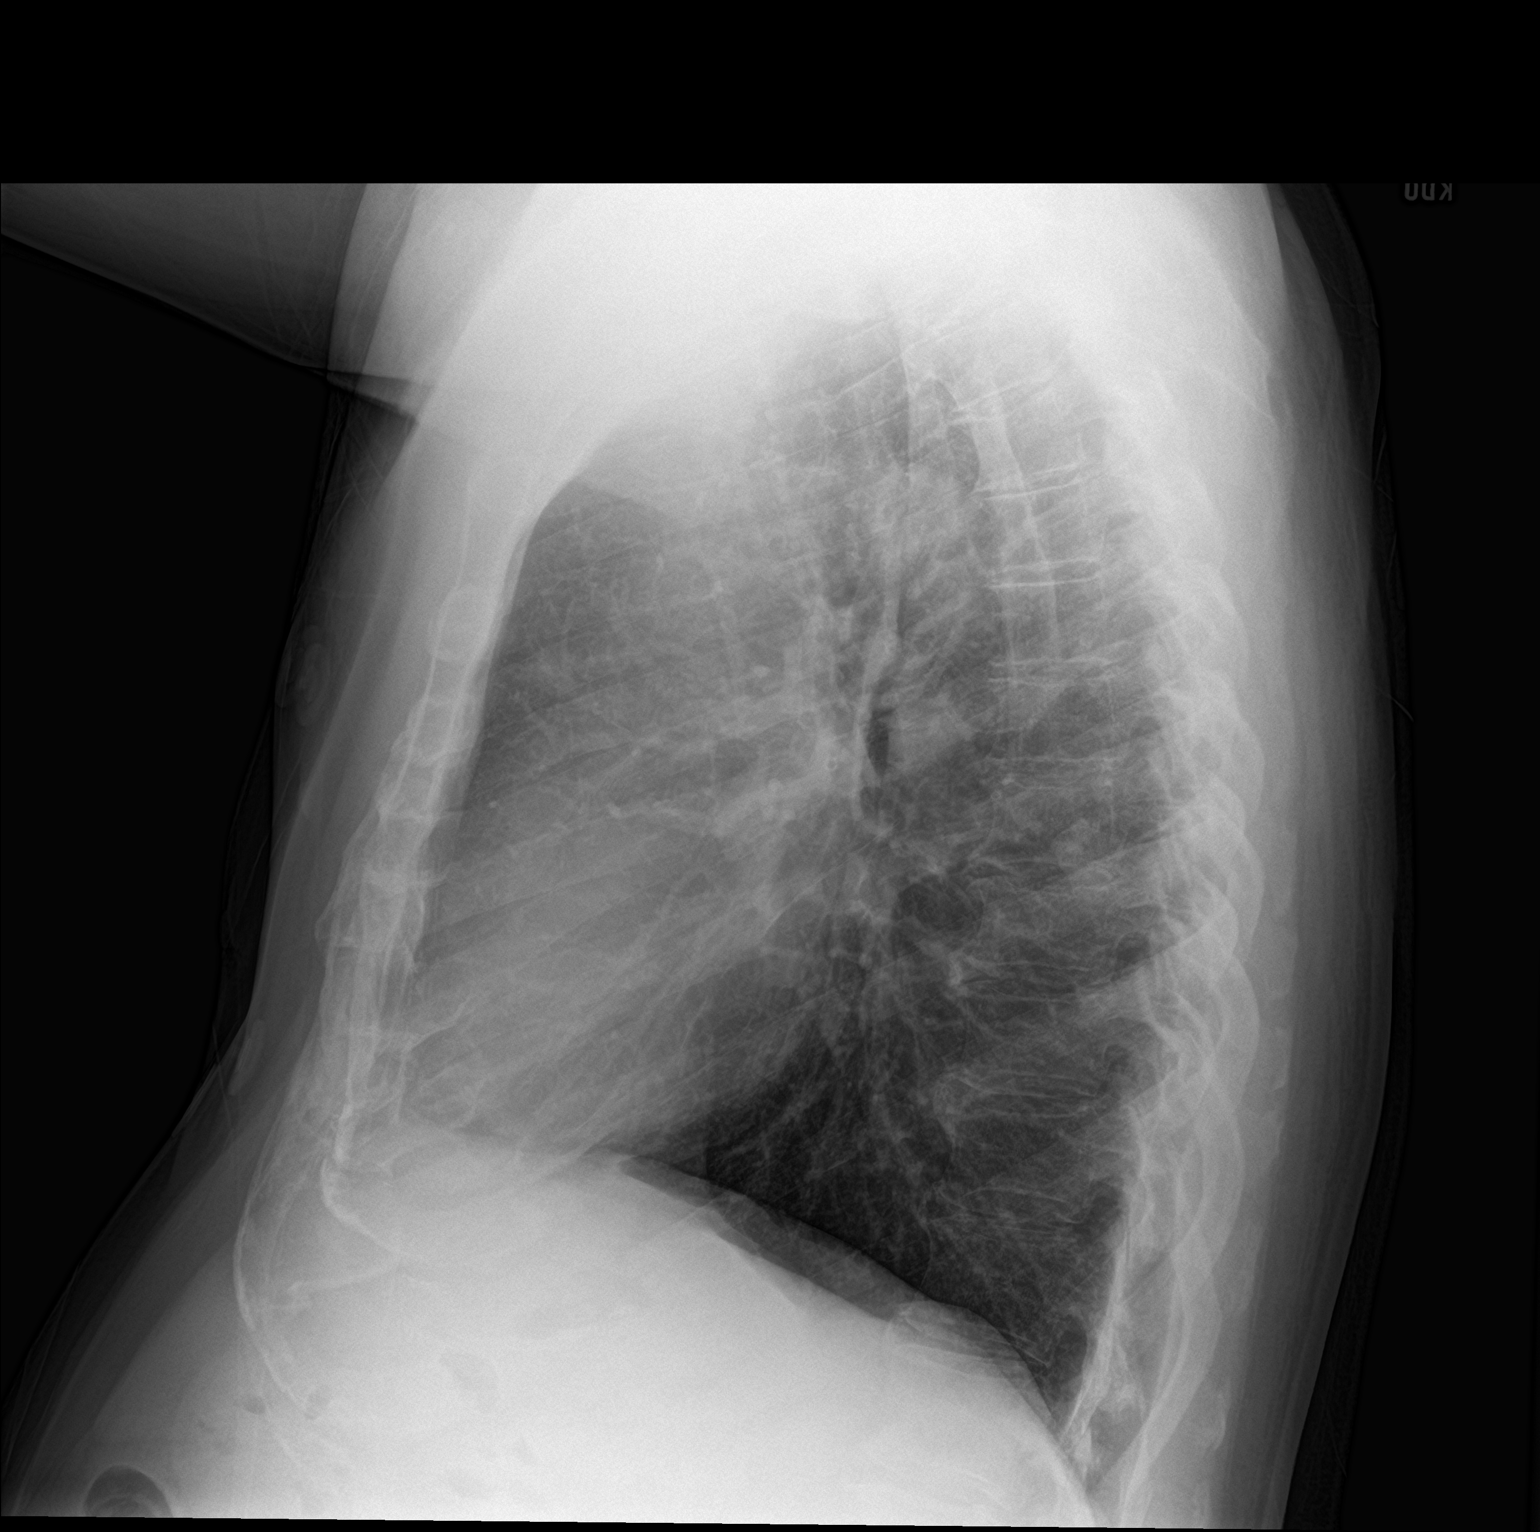

[2 of 2 positions shown; findings below may reference images not displayed]

FINDINGS: The cardiac and mediastinal silhouettes are stable in size and
contour, and remain within normal limits. Aortic atherosclerosis.

The lungs are normally inflated. No airspace consolidation, pleural
effusion, or pulmonary edema is identified. There is no
pneumothorax.

No acute osseous abnormality identified.
IMPRESSION: No active cardiopulmonary disease.

## 2020-01-05 ENCOUNTER — Ambulatory Visit (HOSPITAL_COMMUNITY)
Admission: RE | Admit: 2020-01-05 | Discharge: 2020-01-05 | Disposition: A | Discharge status: Home / Self Care | Payer: Medicare Other | Source: Ambulatory Visit | Attending: Pulmonary Disease | Admitting: Pulmonary Disease

## 2020-01-05 DIAGNOSIS — Z23 Encounter for immunization: Secondary | ICD-10-CM | POA: Diagnosis not present

## 2020-01-05 DIAGNOSIS — U071 COVID-19: Secondary | ICD-10-CM

## 2020-01-05 MED ORDER — FAMOTIDINE IN NACL 20-0.9 MG/50ML-% IV SOLN: 20.0000 mg | Freq: Once | INTRAVENOUS | Status: DC | PRN

## 2020-01-05 MED ORDER — ALBUTEROL SULFATE HFA 108 (90 BASE) MCG/ACT IN AERS: 2.0000 | INHALATION_SPRAY | Freq: Once | RESPIRATORY_TRACT | Status: DC | PRN

## 2020-01-05 MED ORDER — DIPHENHYDRAMINE HCL 50 MG/ML IJ SOLN: 50.0000 mg | Freq: Once | INTRAMUSCULAR | Status: DC | PRN

## 2020-01-05 MED ORDER — SODIUM CHLORIDE 0.9 % IV SOLN
INTRAVENOUS | Status: DC | PRN
  Administered 2020-01-05: 250 mL via INTRAVENOUS

## 2020-01-05 MED ORDER — METHYLPREDNISOLONE SODIUM SUCC 125 MG IJ SOLR: 125.0000 mg | Freq: Once | INTRAMUSCULAR | Status: DC | PRN

## 2020-01-05 MED ORDER — SODIUM CHLORIDE 0.9 % IV SOLN
700.0000 mg | Freq: Once | INTRAVENOUS | Status: AC
  Administered 2020-01-05: 700 mg via INTRAVENOUS
  Filled 2020-01-05: qty 20

## 2020-01-05 MED ORDER — EPINEPHRINE 0.3 MG/0.3ML IJ SOAJ: 0.3000 mg | Freq: Once | INTRAMUSCULAR | Status: DC | PRN

## 2020-01-05 NOTE — Discharge Instructions (Signed)

## 2020-01-05 NOTE — Progress Notes (Signed)
  Diagnosis: COVID-19  Physician: Dr. Lamar Benes  Procedure: [facility] Med: bamlanivimab infusion - Provided patient with bamlanimivab fact sheet for patients, parents and caregivers prior to infusion.  Complications: No immediate complications noted.  Discharge: Discharged home   Trevor [name] 01/05/2020

## 2020-02-29 ENCOUNTER — Encounter (HOSPITAL_COMMUNITY): Payer: Self-pay

## 2020-02-29 ENCOUNTER — Telehealth (HOSPITAL_COMMUNITY): Payer: Self-pay | Admitting: Emergency Medicine

## 2020-02-29 ENCOUNTER — Ambulatory Visit (HOSPITAL_COMMUNITY)
Admission: EM | Admit: 2020-02-29 | Discharge: 2020-02-29 | Disposition: A | Discharge status: Home / Self Care | Payer: Medicare Other

## 2020-02-29 DIAGNOSIS — J441 Chronic obstructive pulmonary disease with (acute) exacerbation: Secondary | ICD-10-CM | POA: Diagnosis not present

## 2020-02-29 HISTORY — DX: COVID-19: U07.1

## 2020-02-29 MED ORDER — METHYLPREDNISOLONE SODIUM SUCC 125 MG IJ SOLR
80.0000 mg | Freq: Once | INTRAMUSCULAR | Status: AC
  Administered 2020-02-29: 80 mg via INTRAMUSCULAR

## 2020-02-29 MED ORDER — FLUTICASONE FUROATE-VILANTEROL 100-25 MCG/INH IN AEPB: 1.0000 | INHALATION_SPRAY | Freq: Every day | RESPIRATORY_TRACT | 0 refills | Status: DC

## 2020-02-29 MED ORDER — CETIRIZINE HCL 10 MG PO TABS: 10.0000 mg | ORAL_TABLET | Freq: Every day | ORAL | 0 refills | Status: DC

## 2020-02-29 MED ORDER — BUDESONIDE-FORMOTEROL FUMARATE 80-4.5 MCG/ACT IN AERO: 2.0000 | INHALATION_SPRAY | Freq: Two times a day (BID) | RESPIRATORY_TRACT | 1 refills | Status: DC

## 2020-02-29 MED ORDER — METHYLPREDNISOLONE SODIUM SUCC 125 MG IJ SOLR
INTRAMUSCULAR | Status: AC
  Filled 2020-02-29: qty 2

## 2020-02-29 NOTE — ED Triage Notes (Signed)
Pt presents with c/o possible COPD flare he thinks is attributed to seasonal allergies as he has been spending more time outside recently. Pt is having shob, wheeze, mostly dry cough and some nasal congestion. Pt has been taking Flonase, albuterol HFA and a nasal inhaler. Pt denies fever/chills, n/v/d or other symptoms.

## 2020-02-29 NOTE — ED Provider Notes (Signed)
[place]    CSN: [id number] Arrival date & time: 02/29/20  1117      History   Chief Complaint Chief Complaint  Patient presents with  . COPD    HPI Trevor [name] is a 74 y.o. male.   74 year old male with past medical history of BPH, COPD, Covid, diabetes, GERD, high cholesterol, hypertension, seizures.  He presents today with possible COPD flare.  Reporting for the past couple days he has had some sneezing, coughing, wheezing, runny nose and nasal congestion.  Symptoms have been constant.  He has been taking Flonase and albuterol inhaler with some relief.  Symptoms started after Saturday.  Denies any fever, chills, body aches, night sweats.  Denies any specific chest pain, shortness of breath.  ROS per HPI      Past Medical History:  Diagnosis Date  . BPH (benign prostatic hyperplasia)   . COPD (chronic obstructive pulmonary disease) ([place])   . COPD (chronic obstructive pulmonary disease) ([place])   . COVID-19   . Diabetes mellitus without complication ([place])   . GERD (gastroesophageal reflux disease)   . Hypercholesterolemia   . Hypertension   . Seizures ([place])   . Type II or unspecified type diabetes mellitus without mention of complication, not stated as uncontrolled     Patient Active Problem List   Diagnosis Date Noted  . Vitamin D deficiency 10/08/2017  . COPD (chronic obstructive pulmonary disease) ([place]) 07/08/2017  . Morbid obesity ([place]) 12/22/2015  . Encounter for Medicare annual wellness exam 12/22/2015  . Hyperlipidemia, mixed   . Hypertension   . Diabetes mellitus due to underlying condition with stage 2 chronic kidney disease ([place])   . Seizures ([place])   . BPH (benign prostatic hyperplasia)   . GERD (gastroesophageal reflux disease)     Past Surgical History:  Procedure Laterality Date  . HAND SURGERY Left 1953  . KNEE ARTHROSCOPY Left 1998       Home Medications    Prior to Admission medications   Medication Sig Start Date End  Date Taking? Authorizing Provider  albuterol (PROVENTIL HFA;VENTOLIN HFA) 108 (90 Base) MCG/ACT inhaler Inhale 1-2 puffs into the lungs every 6 (six) hours as needed for wheezing or shortness of breath. 04/19/18  Yes Robyn Haber, MD  amLODipine (NORVASC) 5 MG tablet Take 5 mg by mouth daily.   Yes [provider]  aspirin 81 MG chewable tablet Chew 81 mg by mouth daily.   Yes [provider]  divalproex (DEPAKOTE ER) 500 MG 24 hr tablet Take 1 tablet (500 mg total) by mouth 2 (two) times daily. 10/11/17  Yes Unk Pinto, MD  finasteride (PROSCAR) 5 MG tablet Take 5 mg by mouth every evening.   Yes [provider]  fluticasone (FLONASE) 50 MCG/ACT nasal spray Place 2 sprays into both nostrils daily. Patient taking differently: Place 2 sprays into both nostrils daily as needed for allergies.  12/05/15  Yes Rolene Course, PA-C  furosemide (LASIX) 20 MG tablet Take 1 tablet (20 mg total) by mouth daily. 11/02/18  Yes Amyot, Nicholes Stairs, NP  glipiZIDE (GLUCOTROL) 10 MG tablet Take 10 mg by mouth 2 (two) times daily before a meal.   Yes [provider]  ipratropium-albuterol (DUONEB) 0.5-2.5 (3) MG/3ML SOLN Take 3 mLs by nebulization every 6 (six) hours as needed (wheezing, shortness of breath). 03/16/19  Yes Wieters, Hallie C, PA-C  lisinopril (PRINIVIL,ZESTRIL) 40 MG tablet Take 40 mg by mouth daily.   Yes [provider]  MAGNESIUM PO Take 420 mg by mouth 2 (two) times daily.   Yes [provider]  metFORMIN (GLUCOPHAGE XR) 500 MG 24 hr tablet Take 2 tablets 2 x / day for Diabetes Patient taking differently: Take 500 mg by mouth 3 (three) times daily.  10/02/17 02/29/20 Yes Unk Pinto, MD  Multiple Vitamin (MULTIVITAMIN) tablet Take 1 tablet by mouth daily.   Yes [provider]  Multiple Vitamins-Minerals (ICAPS AREDS FORMULA PO) Take 1 tablet by mouth daily.   Yes [provider]  rosuvastatin (CRESTOR) 40 MG tablet  Take 40 mg by mouth daily.   Yes [provider]  budesonide-formoterol (SYMBICORT) 80-4.5 MCG/ACT inhaler Inhale 2 puffs into the lungs 2 (two) times daily. 02/29/20   Loura Halt A, NP  cetirizine (ZYRTEC) 10 MG tablet Take 1 tablet (10 mg total) by mouth daily. 02/29/20   Orvan July, NP    Family History Family History  Problem Relation Age of Onset  . CVA Mother   . Thyroid disease Mother   . Heart disease Father   . Diabetes Sister   . Heart disease Sister   . Diabetes Brother   . Heart disease Brother   . Epilepsy Brother   . Schizophrenia Brother     Social History Social History   Tobacco Use  . Smoking status: Former Smoker    Quit date: 12/17/2013    Years since quitting: 6.2  . Smokeless tobacco: Never Used  Substance Use Topics  . Alcohol use: No    Alcohol/week: 0.0 standard drinks  . Drug use: No     Allergies   Penicillins and Latex   Review of Systems Review of Systems   Physical Exam Triage Vital Signs ED Triage Vitals  Enc Vitals Group     BP 02/29/20 1151 (!) 144/66     Pulse Rate 02/29/20 1151 85     Resp 02/29/20 1151 18     Temp 02/29/20 1151 98.4 F (36.9 C)     Temp Source 02/29/20 1151 Oral     SpO2 02/29/20 1151 100 %     Weight 02/29/20 1146 187 lb (84.8 kg)     Height 02/29/20 1146 5\' 11"  (1.803 m)     Head Circumference --      Peak Flow --      Pain Score 02/29/20 1146 0     Pain Loc --      Pain Edu? --      Excl. in [place]? --    No data found.  Updated Vital Signs BP (!) 144/66 (BP Location: Left Arm)   Pulse 85   Temp 98.4 F (36.9 C) (Oral)   Resp 18   Ht 5\' 11"  (1.803 m)   Wt 187 lb (84.8 kg)   SpO2 100%   BMI 26.08 kg/m   Visual Acuity Right Eye Distance:   Left Eye Distance:   Bilateral Distance:    Right Eye Near:   Left Eye Near:    Bilateral Near:     Physical Exam Vitals and nursing note reviewed.  Constitutional:      General: He is not in acute distress.    Appearance: Normal  appearance. He is not ill-appearing, toxic-appearing or diaphoretic.  HENT:     Head: Normocephalic and atraumatic.     Nose: Nose normal.  Eyes:     Conjunctiva/sclera: Conjunctivae normal.  Cardiovascular:     Rate and Rhythm: Normal rate and regular rhythm.  Pulmonary:  Effort: Pulmonary effort is normal.     Breath sounds: Wheezing present.     Comments: Diffuse expiratory wheezing. Abdominal:     Palpations: Abdomen is soft.     Tenderness: There is no abdominal tenderness.  Musculoskeletal:        General: Normal range of motion.     Cervical back: Normal range of motion.  Skin:    General: Skin is warm and dry.  Neurological:     Mental Status: He is alert.  Psychiatric:        Mood and Affect: Mood normal.      UC Treatments / Results  Labs (all labs ordered are listed, but only abnormal results are displayed) Labs Reviewed - No data to display  EKG   Radiology No results found.  Procedures Procedures (including critical care time)  Medications Ordered in UC Medications  methylPREDNISolone sodium succinate (SOLU-MEDROL) 125 mg/2 mL injection 80 mg (80 mg Intramuscular Given 02/29/20 1217)    Initial Impression / Assessment and Plan / UC Course  I have reviewed the triage vital signs and the nursing notes.  Pertinent labs & imaging results that were available during my care of the patient were reviewed by me and considered in my medical decision making (see chart for details).     COPD exacerbation- most likely allergy induced Treated with steroid injection here in clinic today.  Refilled patient's Symbicort.  Reports he has not had this in a while.  This should help decrease exacerbations. Zyrtec daily for allergies and continue Flonase Albuterol inhaler as needed Follow up as needed for continued or worsening symptoms  Final Clinical Impressions(s) / UC Diagnoses   Final diagnoses:  COPD exacerbation [facility])     Discharge Instructions      Sending an inhaler to use daily. This is a steroid maintenance inhaler.  You can use the albuterol you already have for wheezing and shortness of breath.  Zyrtec daily for allergies. Continue the flonase.  Steroid injection given here.  Follow up as needed for continued or worsening symptoms      ED Prescriptions    Medication Sig Dispense Auth. Provider   budesonide-formoterol (SYMBICORT) 80-4.5 MCG/ACT inhaler Inhale 2 puffs into the lungs 2 (two) times daily. 1 Inhaler Keidrick Murty A, NP   cetirizine (ZYRTEC) 10 MG tablet Take 1 tablet (10 mg total) by mouth daily. 30 tablet Loura Halt A, NP     PDMP not reviewed this encounter.   Orvan July, NP 02/29/20 1223

## 2020-02-29 NOTE — Discharge Instructions (Addendum)
Sending an inhaler to use daily. This is a steroid maintenance inhaler.  You can use the albuterol you already have for wheezing and shortness of breath.  Zyrtec daily for allergies. Continue the flonase.  Steroid injection given here.  Follow up as needed for continued or worsening symptoms

## 2020-02-29 NOTE — Telephone Encounter (Signed)
Patient reached out that prescribed symbicort was too unaffordable. Switched to [facility] as this appears to be more affordable.

## 2020-02-29 NOTE — ED Notes (Signed)
96% Room Air HR 86

## 2020-02-29 NOTE — Telephone Encounter (Signed)
Augusto Gamble, NP reviewed medical record and made changes to medications.  Called patient and verified identity with 2 identifiers .  Patient has already bought the initial prescription.

## 2020-04-12 DIAGNOSIS — Z23 Encounter for immunization: Secondary | ICD-10-CM | POA: Diagnosis not present

## 2020-05-04 DIAGNOSIS — Z23 Encounter for immunization: Secondary | ICD-10-CM | POA: Diagnosis not present

## 2020-06-06 ENCOUNTER — Telehealth: Payer: Self-pay | Admitting: Gastroenterology

## 2020-06-06 NOTE — Telephone Encounter (Signed)
Hi Dr.Nandigam,  We received a [facility] for repeat colonoscopy. Prev colon 2011 x polyps, 2015 normal colon requested OP\Path reports will be sending them to you for review.   Please advise on scheduling. Thank you

## 2020-06-28 NOTE — Telephone Encounter (Signed)
Hi Dr Silverio Decamp just checking on the status of the records being reviewed for the pt.

## 2020-07-06 NOTE — Telephone Encounter (Signed)
Called patient to schedule mailbox is full 

## 2020-07-06 NOTE — Telephone Encounter (Signed)
Reviewed, ok to schedule. Thanks

## 2020-07-26 NOTE — Telephone Encounter (Signed)
Called patient to follow up and schedule colonoscopy left voicemail

## 2020-11-17 ENCOUNTER — Ambulatory Visit (HOSPITAL_COMMUNITY)
Admission: EM | Admit: 2020-11-17 | Discharge: 2020-11-17 | Disposition: A | Discharge status: Home / Self Care | Payer: Medicare Other | Attending: Family Medicine | Admitting: Family Medicine

## 2020-11-17 ENCOUNTER — Encounter (HOSPITAL_COMMUNITY): Payer: Self-pay

## 2020-11-17 ENCOUNTER — Ambulatory Visit (INDEPENDENT_AMBULATORY_CARE_PROVIDER_SITE_OTHER): Payer: Medicare Other

## 2020-11-17 ENCOUNTER — Other Ambulatory Visit: Payer: Self-pay

## 2020-11-17 DIAGNOSIS — J205 Acute bronchitis due to respiratory syncytial virus: Secondary | ICD-10-CM | POA: Diagnosis not present

## 2020-11-17 DIAGNOSIS — Z79899 Other long term (current) drug therapy: Secondary | ICD-10-CM | POA: Diagnosis not present

## 2020-11-17 DIAGNOSIS — Z87891 Personal history of nicotine dependence: Secondary | ICD-10-CM | POA: Insufficient documentation

## 2020-11-17 DIAGNOSIS — Z7984 Long term (current) use of oral hypoglycemic drugs: Secondary | ICD-10-CM | POA: Diagnosis not present

## 2020-11-17 DIAGNOSIS — J440 Chronic obstructive pulmonary disease with acute lower respiratory infection: Secondary | ICD-10-CM | POA: Insufficient documentation

## 2020-11-17 DIAGNOSIS — J209 Acute bronchitis, unspecified: Secondary | ICD-10-CM

## 2020-11-17 DIAGNOSIS — Z7982 Long term (current) use of aspirin: Secondary | ICD-10-CM | POA: Insufficient documentation

## 2020-11-17 DIAGNOSIS — R058 Other specified cough: Secondary | ICD-10-CM

## 2020-11-17 DIAGNOSIS — R0602 Shortness of breath: Secondary | ICD-10-CM

## 2020-11-17 DIAGNOSIS — Z880 Allergy status to penicillin: Secondary | ICD-10-CM | POA: Insufficient documentation

## 2020-11-17 DIAGNOSIS — R051 Acute cough: Secondary | ICD-10-CM | POA: Diagnosis not present

## 2020-11-17 DIAGNOSIS — Z20822 Contact with and (suspected) exposure to covid-19: Secondary | ICD-10-CM | POA: Insufficient documentation

## 2020-11-17 DIAGNOSIS — Z7951 Long term (current) use of inhaled steroids: Secondary | ICD-10-CM | POA: Insufficient documentation

## 2020-11-17 LAB — RESP PANEL BY RT-PCR (RSV, FLU A&B, COVID)  RVPGX2
Influenza A by PCR: NEGATIVE
Influenza B by PCR: NEGATIVE
Resp Syncytial Virus by PCR: POSITIVE — AB
SARS Coronavirus 2 by RT PCR: NEGATIVE

## 2020-11-17 MED ORDER — BUDESONIDE-FORMOTEROL FUMARATE 80-4.5 MCG/ACT IN AERO: 2.0000 | INHALATION_SPRAY | Freq: Two times a day (BID) | RESPIRATORY_TRACT | 0 refills | Status: DC

## 2020-11-17 MED ORDER — ALBUTEROL SULFATE HFA 108 (90 BASE) MCG/ACT IN AERS: 1.0000 | INHALATION_SPRAY | Freq: Four times a day (QID) | RESPIRATORY_TRACT | 0 refills | Status: AC | PRN

## 2020-11-17 MED ORDER — PROMETHAZINE-CODEINE 6.25-10 MG/5ML PO SYRP: 5.0000 mL | ORAL_SOLUTION | Freq: Four times a day (QID) | ORAL | 0 refills | Status: DC | PRN

## 2020-11-17 MED ORDER — PREDNISONE 20 MG PO TABS: 20.0000 mg | ORAL_TABLET | Freq: Every day | ORAL | 0 refills | Status: AC

## 2020-11-17 NOTE — ED Triage Notes (Signed)
Pt presents with complaints of cough and shortness of breath x 4 days. Reports he had covid a year ago. Pt denies any other symptoms. Cough is productive. Reports some relief with otc medications. Reports cough is worse at night.

## 2020-11-17 NOTE — ED Provider Notes (Signed)
[place]    CSN: [id number] Arrival date & time: 11/17/20  0801      History   Chief Complaint Chief Complaint  Patient presents with  . Cough    HPI RICKIE GANGE is a 74 y.o. male.   HPI Patient presents today with 4 days of progressively worsening shortness of breath and coughing.  Cough is now productive.  Patient has a medical history significant of COPD and had Covid 19 back in March.  He is fully vaccinated against COVID-19.  He has not received his influenza vaccine.  He is afebrile however tachypneic at present.  He endorses use of his albuterol inhaler consistently, as prescribed.  He normally receives care at the [facility] and has been unable to obtain the Symbicort as previously prescribed it was sent to the regular pharmacy and his insurance does not cover medications not ordered through the [place].  He denies any chest pain but endorses occasional tightness when the shortness of breath worsens.  He denies any unintentional weight gain, lower extremity swelling, dizziness or generalized weakness. Past Medical History:  Diagnosis Date  . BPH (benign prostatic hyperplasia)   . COPD (chronic obstructive pulmonary disease) ([place])   . COPD (chronic obstructive pulmonary disease) ([place])   . COVID-19   . Diabetes mellitus without complication ([place])   . GERD (gastroesophageal reflux disease)   . Hypercholesterolemia   . Hypertension   . Seizures ([place])   . Type II or unspecified type diabetes mellitus without mention of complication, not stated as uncontrolled     Patient Active Problem List   Diagnosis Date Noted  . Vitamin D deficiency 10/08/2017  . COPD (chronic obstructive pulmonary disease) ([place]) 07/08/2017  . Morbid obesity ([place]) 12/22/2015  . Encounter for Medicare annual wellness exam 12/22/2015  . Hyperlipidemia, mixed   . Hypertension   . Diabetes mellitus due to underlying condition with stage 2 chronic kidney disease ([place])   . Seizures ([place])   . BPH  (benign prostatic hyperplasia)   . GERD (gastroesophageal reflux disease)     Past Surgical History:  Procedure Laterality Date  . HAND SURGERY Left 1953  . KNEE ARTHROSCOPY Left 1998       Home Medications    Prior to Admission medications   Medication Sig Start Date End Date Taking? Authorizing Provider  albuterol (PROVENTIL HFA;VENTOLIN HFA) 108 (90 Base) MCG/ACT inhaler Inhale 1-2 puffs into the lungs every 6 (six) hours as needed for wheezing or shortness of breath. 04/19/18   Robyn Haber, MD  amLODipine (NORVASC) 5 MG tablet Take 5 mg by mouth daily.    [provider]  aspirin 81 MG chewable tablet Chew 81 mg by mouth daily.    [provider]  budesonide-formoterol (SYMBICORT) 80-4.5 MCG/ACT inhaler Inhale 2 puffs into the lungs 2 (two) times daily. 02/29/20   Loura Halt A, NP  cetirizine (ZYRTEC) 10 MG tablet Take 1 tablet (10 mg total) by mouth daily. 02/29/20   Loura Halt A, NP  divalproex (DEPAKOTE ER) 500 MG 24 hr tablet Take 1 tablet (500 mg total) by mouth 2 (two) times daily. 10/11/17   Unk Pinto, MD  finasteride (PROSCAR) 5 MG tablet Take 5 mg by mouth every evening.    [provider]  fluticasone (FLONASE) 50 MCG/ACT nasal spray Place 2 sprays into both nostrils daily. Patient taking differently: Place 2 sprays into both nostrils daily as needed for allergies.  12/05/15   Rolene Course, PA-C  fluticasone  furoate-vilanterol (BREO ELLIPTA) 100-25 MCG/INH AEPB Inhale 1 puff into the lungs daily. 02/29/20   Zigmund Gottron, NP  furosemide (LASIX) 20 MG tablet Take 1 tablet (20 mg total) by mouth daily. 11/02/18   Katy Apo, NP  glipiZIDE (GLUCOTROL) 10 MG tablet Take 10 mg by mouth 2 (two) times daily before a meal.    [provider]  ipratropium-albuterol (DUONEB) 0.5-2.5 (3) MG/3ML SOLN Take 3 mLs by nebulization every 6 (six) hours as needed (wheezing, shortness of breath). 03/16/19   Wieters, Hallie C, PA-C   lisinopril (PRINIVIL,ZESTRIL) 40 MG tablet Take 40 mg by mouth daily.    [provider]  MAGNESIUM PO Take 420 mg by mouth 2 (two) times daily.    [provider]  metFORMIN (GLUCOPHAGE XR) 500 MG 24 hr tablet Take 2 tablets 2 x / day for Diabetes Patient taking differently: Take 500 mg by mouth 3 (three) times daily.  10/02/17 02/29/20  Unk Pinto, MD  Multiple Vitamin (MULTIVITAMIN) tablet Take 1 tablet by mouth daily.    [provider]  Multiple Vitamins-Minerals (ICAPS AREDS FORMULA PO) Take 1 tablet by mouth daily.    [provider]  rosuvastatin (CRESTOR) 40 MG tablet Take 40 mg by mouth daily.    [provider]    Family History Family History  Problem Relation Age of Onset  . CVA Mother   . Thyroid disease Mother   . Heart disease Father   . Diabetes Sister   . Heart disease Sister   . Diabetes Brother   . Heart disease Brother   . Epilepsy Brother   . Schizophrenia Brother     Social History Social History   Tobacco Use  . Smoking status: Former Smoker    Quit date: 12/17/2013    Years since quitting: 6.9  . Smokeless tobacco: Never Used  Vaping Use  . Vaping Use: Never used  Substance Use Topics  . Alcohol use: No    Alcohol/week: 0.0 standard drinks  . Drug use: No     Allergies   Penicillins and Latex  Review of Systems Review of Systems Pertinent negatives listed in HPI Physical Exam Triage Vital Signs ED Triage Vitals  Enc Vitals Group     BP      Pulse      Resp      Temp      Temp src      SpO2      Weight      Height      Head Circumference      Peak Flow      Pain Score      Pain Loc      Pain Edu?      Excl. in [place]?    No data found.  Updated Vital Signs There were no vitals taken for this visit.  Visual Acuity Right Eye Distance:   Left Eye Distance:   Bilateral Distance:    Right Eye Near:   Left Eye Near:    Bilateral Near:     Physical Exam Constitutional:       General: He is not in acute distress.    Appearance: He is obese.  HENT:     Right Ear: Tympanic membrane normal.     Left Ear: Tympanic membrane normal.     Nose: Congestion and rhinorrhea present.     Mouth/Throat:     Pharynx: Posterior oropharyngeal erythema present. No oropharyngeal exudate.  Eyes:  Extraocular Movements: Extraocular movements intact.     Pupils: Pupils are equal, round, and reactive to light.  Cardiovascular:     Rate and Rhythm: Normal rate and regular rhythm.  Pulmonary:     Effort: Tachypnea present. No prolonged expiration.     Breath sounds: No decreased air movement. Rhonchi present.  Skin:    General: Skin is warm and dry.     Capillary Refill: Capillary refill takes less than 2 seconds.  Neurological:     General: No focal deficit present.     Mental Status: He is alert and oriented to person, place, and time.  Psychiatric:        Mood and Affect: Mood normal.        Behavior: Behavior normal.        Thought Content: Thought content normal.        Judgment: Judgment normal.      UC Treatments / Results  Labs (all labs ordered are listed, but only abnormal results are displayed) Labs Reviewed - No data to display  EKG   Radiology DG Chest 2 View  Result Date: 11/17/2020 CLINICAL DATA:  Shortness of breath EXAM: CHEST - 2 VIEW COMPARISON:  March 16, 2019 FINDINGS: Lungs are clear. Heart size and pulmonary vascularity are normal. No adenopathy. There is aortic atherosclerosis. There is mild degenerative change in the thoracic spine. IMPRESSION: Lungs clear.  Heart size normal. Aortic Atherosclerosis (ICD10-I70.0). Electronically Signed   By: Lowella Grip III M.D.   On: 11/17/2020 08:41    Procedures Procedures (including critical care time)  Medications Ordered in UC Medications - No data to display  Initial Impression / Assessment and Plan / UC Course  I have reviewed the triage vital signs and the nursing notes.  Pertinent  labs & imaging results that were available during my care of the patient were reviewed by me and considered in my medical decision making (see chart for details).    Chest x-ray negative for pneumonia.  Will treat for an acute bronchitis related to COPD.  Will defer any antibiotics at present given negative chest x-ray.  See discharge medications below.  Respiratory panel pending.  Red flags discussed that warrant immediate follow-up at the ER.  Patient provided a printed copy of the Symbicort prescription advised he can attempt to take it over to the [place] pharmacy to see if they will fill the medication as most VA plans will allow patients to be seen at urgent cares however I am uncertain if this extends to allowing coverage for medications prescribed through the urgent care.  Patient agreed with plan and will return if symptoms worsen or do not improve. Final Clinical Impressions(s) / UC Diagnoses   Final diagnoses:  Productive cough  SOB (shortness of breath)  Acute bronchitis, unspecified organism   Discharge Instructions   None    ED Prescriptions    None     PDMP not reviewed this encounter.   Scot Jun, [place] 11/19/20 [phone]

## 2020-12-02 ENCOUNTER — Encounter (HOSPITAL_COMMUNITY): Payer: Self-pay | Admitting: Emergency Medicine

## 2020-12-02 ENCOUNTER — Ambulatory Visit (HOSPITAL_COMMUNITY)
Admission: EM | Admit: 2020-12-02 | Discharge: 2020-12-02 | Disposition: A | Discharge status: Home / Self Care | Payer: No Typology Code available for payment source | Attending: Family Medicine | Admitting: Family Medicine

## 2020-12-02 ENCOUNTER — Other Ambulatory Visit: Payer: Self-pay

## 2020-12-02 DIAGNOSIS — L989 Disorder of the skin and subcutaneous tissue, unspecified: Secondary | ICD-10-CM

## 2020-12-02 MED ORDER — DOXYCYCLINE HYCLATE 100 MG PO TABS: 100.0000 mg | ORAL_TABLET | Freq: Two times a day (BID) | ORAL | 0 refills | Status: DC

## 2020-12-02 NOTE — ED Provider Notes (Signed)
[name]    CSN: [id number] Arrival date & time: 12/02/20  1550      History   Chief Complaint Chief Complaint  Patient presents with  . Insect Bite    Tick bite, removed 1 month ago    HPI Trevor [name] is a 74 y.o. male.   Here today with a lesion on his right cheek that started as a tick bite that has not healed. Has gotten worse the past few days and become red and swollen. States he frequently pulls the scab off so he can put neosporin on the area. Denies fever, chills, drainage from the area.      Past Medical History:  Diagnosis Date  . BPH (benign prostatic hyperplasia)   . COPD (chronic obstructive pulmonary disease) ([place])   . COPD (chronic obstructive pulmonary disease) ([place])   . COVID-19   . Diabetes mellitus without complication ([place])   . GERD (gastroesophageal reflux disease)   . Hypercholesterolemia   . Hypertension   . Seizures ([place])   . Type II or unspecified type diabetes mellitus without mention of complication, not stated as uncontrolled     Patient Active Problem List   Diagnosis Date Noted  . Vitamin D deficiency 10/08/2017  . COPD (chronic obstructive pulmonary disease) ([place]) 07/08/2017  . Morbid obesity ([place]) 12/22/2015  . Encounter for Medicare annual wellness exam 12/22/2015  . Hyperlipidemia, mixed   . Hypertension   . Diabetes mellitus due to underlying condition with stage 2 chronic kidney disease ([place])   . Seizures ([place])   . BPH (benign prostatic hyperplasia)   . GERD (gastroesophageal reflux disease)     Past Surgical History:  Procedure Laterality Date  . HAND SURGERY Left 1953  . KNEE ARTHROSCOPY Left 1998       Home Medications    Prior to Admission medications   Medication Sig Start Date End Date Taking? Authorizing Provider  albuterol (VENTOLIN HFA) 108 (90 Base) MCG/ACT inhaler Inhale 1-2 puffs into the lungs every 6 (six) hours as needed for wheezing or shortness of breath. 11/17/20   Scot Jun, FNP  amLODipine (NORVASC) 5 MG tablet Take 5 mg by mouth daily.    [provider]  aspirin 81 MG chewable tablet Chew 81 mg by mouth daily.    [provider]  budesonide-formoterol (SYMBICORT) 80-4.5 MCG/ACT inhaler Inhale 2 puffs into the lungs 2 (two) times daily. 11/17/20   Scot Jun, FNP  cetirizine (ZYRTEC) 10 MG tablet Take 1 tablet (10 mg total) by mouth daily. 02/29/20   Loura Halt A, NP  divalproex (DEPAKOTE ER) 500 MG 24 hr tablet Take 1 tablet (500 mg total) by mouth 2 (two) times daily. 10/11/17   Unk Pinto, MD  doxycycline (VIBRA-TABS) 100 MG tablet Take 1 tablet (100 mg total) by mouth 2 (two) times daily. 12/02/20   Volney American, PA-C  finasteride (PROSCAR) 5 MG tablet Take 5 mg by mouth every evening.    [provider]  fluticasone (FLONASE) 50 MCG/ACT nasal spray Place 2 sprays into both nostrils daily. Patient taking differently: Place 2 sprays into both nostrils daily as needed for allergies.  12/05/15   Rolene Course, PA-C  fluticasone furoate-vilanterol (BREO ELLIPTA) 100-25 MCG/INH AEPB Inhale 1 puff into the lungs daily. 02/29/20   Zigmund Gottron, NP  furosemide (LASIX) 20 MG tablet Take 1 tablet (20 mg total) by mouth daily. 11/02/18   Katy Apo, NP  glipiZIDE ([place])  10 MG tablet Take 10 mg by mouth 2 (two) times daily before a meal.    [provider]  ipratropium-albuterol (DUONEB) 0.5-2.5 (3) MG/3ML SOLN Take 3 mLs by nebulization every 6 (six) hours as needed (wheezing, shortness of breath). 03/16/19   Wieters, Hallie C, PA-C  lisinopril (PRINIVIL,ZESTRIL) 40 MG tablet Take 40 mg by mouth daily.    [provider]  MAGNESIUM PO Take 420 mg by mouth 2 (two) times daily.    [provider]  metFORMIN (GLUCOPHAGE XR) 500 MG 24 hr tablet Take 2 tablets 2 x / day for Diabetes Patient taking differently: Take 500 mg by mouth 3 (three) times daily.  10/02/17 02/29/20  Unk Pinto, MD  Multiple Vitamin (MULTIVITAMIN) tablet Take 1 tablet by mouth daily.    [provider]  Multiple Vitamins-Minerals (ICAPS AREDS FORMULA PO) Take 1 tablet by mouth daily.    [provider]  promethazine-codeine (PHENERGAN WITH CODEINE) 6.25-10 MG/5ML syrup Take 5 mLs by mouth every 6 (six) hours as needed for cough. 11/17/20   Scot Jun, FNP  rosuvastatin (CRESTOR) 40 MG tablet Take 40 mg by mouth daily.    [provider]    Family History Family History  Problem Relation Age of Onset  . CVA Mother   . Thyroid disease Mother   . Heart disease Father   . Diabetes Sister   . Heart disease Sister   . Diabetes Brother   . Heart disease Brother   . Epilepsy Brother   . Schizophrenia Brother     Social History Social History   Tobacco Use  . Smoking status: Former Smoker    Quit date: 12/17/2013    Years since quitting: 6.9  . Smokeless tobacco: Never Used  Vaping Use  . Vaping Use: Never used  Substance Use Topics  . Alcohol use: No    Alcohol/week: 0.0 standard drinks  . Drug use: No     Allergies   Penicillins and Latex   Review of Systems Review of Systems PER HPI    Physical Exam Triage Vital Signs ED Triage Vitals [12/02/20 1707]  Enc Vitals Group     BP 140/67     Pulse Rate 80     Resp 16     Temp 98.9 F (37.2 C)     Temp Source Oral     SpO2 98 %     Weight      Height      Head Circumference      Peak Flow      Pain Score 2     Pain Loc      Pain Edu?      Excl. in [place]?    No data found.  Updated Vital Signs BP 140/67   Pulse 80   Temp 98.9 F (37.2 C) (Oral)   Resp 16   SpO2 98%   Visual Acuity Right Eye Distance:   Left Eye Distance:   Bilateral Distance:    Right Eye Near:   Left Eye Near:    Bilateral Near:     Physical Exam Vitals and nursing note reviewed.  Constitutional:      Appearance: Normal appearance.  HENT:     Head: Atraumatic.     Mouth/Throat:     Mouth:  Mucous membranes are moist.     Pharynx: Oropharynx is clear.  Eyes:     Extraocular Movements: Extraocular movements intact.     Conjunctiva/sclera: Conjunctivae  normal.  Cardiovascular:     Rate and Rhythm: Normal rate and regular rhythm.  Pulmonary:     Effort: Pulmonary effort is normal.     Breath sounds: Normal breath sounds.  Musculoskeletal:        General: Normal range of motion.     Cervical back: Normal range of motion and neck supple.  Skin:    General: Skin is warm.     Findings: Lesion (ulceration of right cheek with surrounding erythema, not actively bleeding or draining. ttp) present.  Neurological:     Mental Status: He is oriented to person, place, and time. Mental status is at baseline.     Motor: No weakness.     Gait: Abnormal gait: walks with a cane.  Psychiatric:        Mood and Affect: Mood normal.        Thought Content: Thought content normal.        Judgment: Judgment normal.      UC Treatments / Results  Labs (all labs ordered are listed, but only abnormal results are displayed) Labs Reviewed - No data to display  EKG   Radiology No results found.  Procedures Procedures (including critical care time)  Medications Ordered in UC Medications - No data to display  Initial Impression / Assessment and Plan / UC Course  I have reviewed the triage vital signs and the nursing notes.  Pertinent labs & imaging results that were available during my care of the patient were reviewed by me and considered in my medical decision making (see chart for details).     Will start doxycycline to cover for infection, tick bourne illnesses as he is having some mild generalized aching which he states is not abnormal for him. Discussed wound care, close PCP f/u next week to ensure area is improving and he is feeling well overall. ED precautions given if worsening.   Final Clinical Impressions(s) / UC Diagnoses   Final diagnoses:  Skin lesion   Discharge  Instructions   None    ED Prescriptions    Medication Sig Dispense Auth. Provider   doxycycline (VIBRA-TABS) 100 MG tablet Take 1 tablet (100 mg total) by mouth 2 (two) times daily. 20 tablet Volney American, [place]     PDMP not reviewed this encounter.   Volney American, [place] 12/02/20 1759

## 2020-12-02 NOTE — ED Triage Notes (Signed)
PT reports he pulled a tick off of the right side of his face and the area has not healed. Removed tick 1 month ago. Reports some dizziness / lightheadedness since removal.

## 2020-12-13 DIAGNOSIS — Z23 Encounter for immunization: Secondary | ICD-10-CM | POA: Diagnosis not present

## 2021-06-15 ENCOUNTER — Encounter (HOSPITAL_COMMUNITY): Payer: Self-pay | Admitting: Emergency Medicine

## 2021-06-15 ENCOUNTER — Other Ambulatory Visit: Payer: Self-pay

## 2021-06-15 ENCOUNTER — Emergency Department (HOSPITAL_COMMUNITY): Payer: No Typology Code available for payment source

## 2021-06-15 ENCOUNTER — Inpatient Hospital Stay (HOSPITAL_COMMUNITY)
Admission: EM | Admit: 2021-06-15 | Discharge: 2021-06-22 | DRG: 435 | Disposition: A | Discharge status: Home / Self Care | Payer: No Typology Code available for payment source | Attending: Student in an Organized Health Care Education/Training Program | Admitting: Student in an Organized Health Care Education/Training Program

## 2021-06-15 DIAGNOSIS — E7800 Pure hypercholesterolemia, unspecified: Secondary | ICD-10-CM | POA: Diagnosis present

## 2021-06-15 DIAGNOSIS — J189 Pneumonia, unspecified organism: Secondary | ICD-10-CM | POA: Diagnosis present

## 2021-06-15 DIAGNOSIS — Z823 Family history of stroke: Secondary | ICD-10-CM

## 2021-06-15 DIAGNOSIS — Z833 Family history of diabetes mellitus: Secondary | ICD-10-CM

## 2021-06-15 DIAGNOSIS — C220 Liver cell carcinoma: Secondary | ICD-10-CM | POA: Diagnosis not present

## 2021-06-15 DIAGNOSIS — J440 Chronic obstructive pulmonary disease with acute lower respiratory infection: Secondary | ICD-10-CM | POA: Diagnosis present

## 2021-06-15 DIAGNOSIS — Z20822 Contact with and (suspected) exposure to covid-19: Secondary | ICD-10-CM | POA: Diagnosis present

## 2021-06-15 DIAGNOSIS — Z880 Allergy status to penicillin: Secondary | ICD-10-CM

## 2021-06-15 DIAGNOSIS — Z91040 Latex allergy status: Secondary | ICD-10-CM

## 2021-06-15 DIAGNOSIS — Z8249 Family history of ischemic heart disease and other diseases of the circulatory system: Secondary | ICD-10-CM

## 2021-06-15 DIAGNOSIS — N179 Acute kidney failure, unspecified: Secondary | ICD-10-CM | POA: Diagnosis present

## 2021-06-15 DIAGNOSIS — J159 Unspecified bacterial pneumonia: Secondary | ICD-10-CM | POA: Diagnosis present

## 2021-06-15 DIAGNOSIS — R197 Diarrhea, unspecified: Secondary | ICD-10-CM | POA: Diagnosis not present

## 2021-06-15 DIAGNOSIS — E440 Moderate protein-calorie malnutrition: Secondary | ICD-10-CM | POA: Insufficient documentation

## 2021-06-15 DIAGNOSIS — Z8349 Family history of other endocrine, nutritional and metabolic diseases: Secondary | ICD-10-CM

## 2021-06-15 DIAGNOSIS — Z820 Family history of epilepsy and other diseases of the nervous system: Secondary | ICD-10-CM

## 2021-06-15 DIAGNOSIS — N400 Enlarged prostate without lower urinary tract symptoms: Secondary | ICD-10-CM | POA: Diagnosis present

## 2021-06-15 DIAGNOSIS — I5189 Other ill-defined heart diseases: Secondary | ICD-10-CM

## 2021-06-15 DIAGNOSIS — D649 Anemia, unspecified: Secondary | ICD-10-CM | POA: Diagnosis present

## 2021-06-15 DIAGNOSIS — Z87891 Personal history of nicotine dependence: Secondary | ICD-10-CM

## 2021-06-15 DIAGNOSIS — K219 Gastro-esophageal reflux disease without esophagitis: Secondary | ICD-10-CM | POA: Diagnosis present

## 2021-06-15 DIAGNOSIS — E119 Type 2 diabetes mellitus without complications: Secondary | ICD-10-CM | POA: Diagnosis present

## 2021-06-15 DIAGNOSIS — Z8616 Personal history of COVID-19: Secondary | ICD-10-CM

## 2021-06-15 DIAGNOSIS — I10 Essential (primary) hypertension: Secondary | ICD-10-CM | POA: Diagnosis present

## 2021-06-15 DIAGNOSIS — W06XXXA Fall from bed, initial encounter: Secondary | ICD-10-CM | POA: Diagnosis present

## 2021-06-15 DIAGNOSIS — R160 Hepatomegaly, not elsewhere classified: Secondary | ICD-10-CM

## 2021-06-15 LAB — URINALYSIS, ROUTINE W REFLEX MICROSCOPIC
Bacteria, UA: NONE SEEN
Bilirubin Urine: NEGATIVE
Glucose, UA: >=500 mg/dL — AB
Hgb urine dipstick: NEGATIVE
Ketones, ur: NEGATIVE mg/dL
Leukocytes,Ua: NEGATIVE
Nitrite: NEGATIVE
Protein, ur: NEGATIVE mg/dL
Specific Gravity, Urine: 1.024 (ref 1.005–1.030)
pH: 6 (ref 5.0–8.0)

## 2021-06-15 LAB — COMPREHENSIVE METABOLIC PANEL
ALT: 51 U/L — ABNORMAL HIGH (ref 0–44)
AST: 59 U/L — ABNORMAL HIGH (ref 15–41)
Albumin: 2.8 g/dL — ABNORMAL LOW (ref 3.5–5.0)
Alkaline Phosphatase: 179 U/L — ABNORMAL HIGH (ref 38–126)
Anion gap: 12 (ref 5–15)
BUN: 22 mg/dL (ref 8–23)
CO2: 22 mmol/L (ref 22–32)
Calcium: 9.4 mg/dL (ref 8.9–10.3)
Chloride: 101 mmol/L (ref 98–111)
Creatinine, Ser: 1.32 mg/dL — ABNORMAL HIGH (ref 0.61–1.24)
GFR, Estimated: 57 mL/min — ABNORMAL LOW (ref >60)
Glucose, Bld: 220 mg/dL — ABNORMAL HIGH (ref 70–99)
Potassium: 5 mmol/L (ref 3.5–5.1)
Sodium: 135 mmol/L (ref 135–145)
Total Bilirubin: 1.5 mg/dL — ABNORMAL HIGH (ref 0.3–1.2)
Total Protein: 6.7 g/dL (ref 6.5–8.1)

## 2021-06-15 LAB — CK: Total CK: 24 U/L — ABNORMAL LOW (ref 49–397)

## 2021-06-15 LAB — CBC
HCT: 36.4 % — ABNORMAL LOW (ref 39.0–52.0)
Hemoglobin: 10.6 g/dL — ABNORMAL LOW (ref 13.0–17.0)
MCH: 23.7 pg — ABNORMAL LOW (ref 26.0–34.0)
MCHC: 29.1 g/dL — ABNORMAL LOW (ref 30.0–36.0)
MCV: 81.4 fL (ref 80.0–100.0)
Platelets: 598 10*3/uL — ABNORMAL HIGH (ref 150–400)
RBC: 4.47 MIL/uL (ref 4.22–5.81)
RDW: 17.6 % — ABNORMAL HIGH (ref 11.5–15.5)
WBC: 16.6 10*3/uL — ABNORMAL HIGH (ref 4.0–10.5)
nRBC: 0.3 % — ABNORMAL HIGH (ref 0.0–0.2)

## 2021-06-15 LAB — RETIC PANEL
Immature Retic Fract: 31.6 % — ABNORMAL HIGH (ref 2.3–15.9)
RBC.: 4.47 MIL/uL (ref 4.22–5.81)
Retic Count, Absolute: 90.3 10*3/uL (ref 19.0–186.0)
Retic Ct Pct: 2 % (ref 0.4–3.1)
Reticulocyte Hemoglobin: 24 pg — ABNORMAL LOW (ref >27.9)

## 2021-06-15 LAB — BRAIN NATRIURETIC PEPTIDE: B Natriuretic Peptide: 177.4 pg/mL — ABNORMAL HIGH (ref 0.0–100.0)

## 2021-06-15 LAB — LIPASE, BLOOD: Lipase: 33 U/L (ref 11–51)

## 2021-06-15 LAB — TSH: TSH: 1.231 u[IU]/mL (ref 0.350–4.500)

## 2021-06-15 LAB — CBG MONITORING, ED: Glucose-Capillary: 189 mg/dL — ABNORMAL HIGH (ref 70–99)

## 2021-06-15 LAB — LACTIC ACID, PLASMA
Lactic Acid, Venous: 2.2 mmol/L (ref 0.5–1.9)
Lactic Acid, Venous: 2.6 mmol/L (ref 0.5–1.9)

## 2021-06-15 LAB — RESP PANEL BY RT-PCR (FLU A&B, COVID) ARPGX2
Influenza A by PCR: NEGATIVE
Influenza B by PCR: NEGATIVE
SARS Coronavirus 2 by RT PCR: NEGATIVE

## 2021-06-15 LAB — GLUCOSE, CAPILLARY: Glucose-Capillary: 239 mg/dL — ABNORMAL HIGH (ref 70–99)

## 2021-06-15 MED ORDER — INSULIN ASPART 100 UNIT/ML IJ SOLN
0.0000 [IU] | Freq: Three times a day (TID) | INTRAMUSCULAR | Status: DC
  Administered 2021-06-15: 2 [IU] via SUBCUTANEOUS
  Administered 2021-06-16: 1 [IU] via SUBCUTANEOUS
  Administered 2021-06-16: 3 [IU] via SUBCUTANEOUS
  Administered 2021-06-16 – 2021-06-17 (×2): 2 [IU] via SUBCUTANEOUS
  Administered 2021-06-17: 3 [IU] via SUBCUTANEOUS
  Administered 2021-06-18: 2 [IU] via SUBCUTANEOUS
  Administered 2021-06-18 – 2021-06-20 (×5): 1 [IU] via SUBCUTANEOUS
  Administered 2021-06-21: 3 [IU] via SUBCUTANEOUS
  Administered 2021-06-21 – 2021-06-22 (×3): 2 [IU] via SUBCUTANEOUS

## 2021-06-15 MED ORDER — ROSUVASTATIN CALCIUM 5 MG PO TABS
10.0000 mg | ORAL_TABLET | Freq: Every day | ORAL | Status: DC
  Administered 2021-06-15 – 2021-06-21 (×7): 10 mg via ORAL
  Filled 2021-06-15 (×7): qty 2

## 2021-06-15 MED ORDER — ENSURE ENLIVE PO LIQD
237.0000 mL | Freq: Two times a day (BID) | ORAL | Status: DC
  Administered 2021-06-16: 237 mL via ORAL
  Filled 2021-06-15 (×3): qty 237

## 2021-06-15 MED ORDER — ACETAMINOPHEN 650 MG RE SUPP: 650.0000 mg | Freq: Four times a day (QID) | RECTAL | Status: DC | PRN

## 2021-06-15 MED ORDER — IOHEXOL 350 MG/ML SOLN
100.0000 mL | Freq: Once | INTRAVENOUS | Status: AC | PRN
  Administered 2021-06-15: 100 mL via INTRAVENOUS

## 2021-06-15 MED ORDER — SODIUM CHLORIDE 0.9 % IV SOLN
500.0000 mg | Freq: Once | INTRAVENOUS | Status: AC
  Administered 2021-06-15: 500 mg via INTRAVENOUS
  Filled 2021-06-15: qty 500

## 2021-06-15 MED ORDER — SODIUM CHLORIDE 0.9 % IV SOLN
1.0000 g | Freq: Once | INTRAVENOUS | Status: AC
  Administered 2021-06-15: 1 g via INTRAVENOUS
  Filled 2021-06-15: qty 10

## 2021-06-15 MED ORDER — DIVALPROEX SODIUM ER 500 MG PO TB24
500.0000 mg | ORAL_TABLET | Freq: Two times a day (BID) | ORAL | Status: DC
  Administered 2021-06-15 – 2021-06-22 (×14): 500 mg via ORAL
  Filled 2021-06-15 (×16): qty 1

## 2021-06-15 MED ORDER — ALBUTEROL SULFATE HFA 108 (90 BASE) MCG/ACT IN AERS
1.0000 | INHALATION_SPRAY | Freq: Four times a day (QID) | RESPIRATORY_TRACT | Status: DC | PRN
  Administered 2021-06-16 (×2): 2 via RESPIRATORY_TRACT
  Filled 2021-06-15: qty 6.7

## 2021-06-15 MED ORDER — IPRATROPIUM-ALBUTEROL 0.5-2.5 (3) MG/3ML IN SOLN: 3.0000 mL | Freq: Four times a day (QID) | RESPIRATORY_TRACT | Status: DC | PRN

## 2021-06-15 MED ORDER — ALBUTEROL SULFATE HFA 108 (90 BASE) MCG/ACT IN AERS
6.0000 | INHALATION_SPRAY | Freq: Once | RESPIRATORY_TRACT | Status: AC
  Administered 2021-06-15: 6 via RESPIRATORY_TRACT
  Filled 2021-06-15: qty 6.7

## 2021-06-15 MED ORDER — AEROCHAMBER PLUS FLO-VU LARGE MISC
1.0000 | Freq: Once | Status: AC
  Administered 2021-06-15: 1

## 2021-06-15 MED ORDER — FLUTICASONE FUROATE-VILANTEROL 100-25 MCG/INH IN AEPB
1.0000 | INHALATION_SPRAY | Freq: Every day | RESPIRATORY_TRACT | Status: DC
  Administered 2021-06-16 – 2021-06-22 (×7): 1 via RESPIRATORY_TRACT
  Filled 2021-06-15 (×2): qty 28

## 2021-06-15 MED ORDER — SODIUM CHLORIDE 0.9 % IV BOLUS
1000.0000 mL | Freq: Once | INTRAVENOUS | Status: AC
  Administered 2021-06-15: 1000 mL via INTRAVENOUS

## 2021-06-15 MED ORDER — FINASTERIDE 5 MG PO TABS
5.0000 mg | ORAL_TABLET | Freq: Every evening | ORAL | Status: DC
  Administered 2021-06-15 – 2021-06-21 (×7): 5 mg via ORAL
  Filled 2021-06-15 (×7): qty 1

## 2021-06-15 MED ORDER — ENOXAPARIN SODIUM 30 MG/0.3ML IJ SOSY
30.0000 mg | PREFILLED_SYRINGE | INTRAMUSCULAR | Status: DC
  Administered 2021-06-15: 30 mg via SUBCUTANEOUS
  Filled 2021-06-15: qty 0.3

## 2021-06-15 MED ORDER — GUAIFENESIN ER 600 MG PO TB12
600.0000 mg | ORAL_TABLET | Freq: Two times a day (BID) | ORAL | Status: DC
  Administered 2021-06-15 – 2021-06-22 (×14): 600 mg via ORAL
  Filled 2021-06-15 (×14): qty 1

## 2021-06-15 MED ORDER — SODIUM CHLORIDE 0.9 % IV SOLN: INTRAVENOUS | Status: AC

## 2021-06-15 MED ORDER — ACETAMINOPHEN 325 MG PO TABS
650.0000 mg | ORAL_TABLET | Freq: Four times a day (QID) | ORAL | Status: DC | PRN
  Administered 2021-06-16 – 2021-06-22 (×8): 650 mg via ORAL
  Filled 2021-06-15 (×8): qty 2

## 2021-06-15 MED ORDER — SODIUM CHLORIDE 0.9 % IV BOLUS
500.0000 mL | Freq: Once | INTRAVENOUS | Status: AC
  Administered 2021-06-15: 500 mL via INTRAVENOUS

## 2021-06-15 MED ORDER — LACTATED RINGERS IV BOLUS
500.0000 mL | Freq: Once | INTRAVENOUS | Status: AC
  Administered 2021-06-15: 500 mL via INTRAVENOUS

## 2021-06-15 NOTE — ED Notes (Signed)
This RN attempted to give report.  RN to call back, unable to take report at this time.

## 2021-06-15 NOTE — ED Notes (Signed)
Pt reporting he is here for 1 episode of diarrhea that occurred "the other day". Pt denies any other complaints at this time. Pt abd distended which pt reports is baseline and is minimally tender to LLQ upon palpation. Bowel sounds hyperactive.

## 2021-06-15 NOTE — ED Provider Notes (Signed)
[facility] DEPART[facility] Provider Note   CSN: [id number] Arrival date & time: 06/15/21  1017     History Chief Complaint  Patient presents with   Diarrhea    GIOVANNI BIBY is a 75 y.o. male with a history of COPD, diabetes mellitus, hypertension, hypercholesterolemia, and BPH who presents to the emergency department with complaints of cough for the past 5 to 6 days as well as diarrhea over the past few days.  Patient states he has had nasal congestion, sore throat, cough productive of phlegm sputum, dyspnea and some wheezing.  He has been using his inhalers with some improvement at times.  He has had some subjective fevers at home as well.  The past few days he had some diarrhea, mild loose stools, states that this seems to be getting better.  He denies chest pain, abdominal pain, nausea, vomiting, melena, hematochezia, or dysuria.  HPI     Past Medical History:  Diagnosis Date   BPH (benign prostatic hyperplasia)    COPD (chronic obstructive pulmonary disease) ([facility])    COPD (chronic obstructive pulmonary disease) ([place])    COVID-19    Diabetes mellitus without complication ([facility])    GERD (gastroesophageal reflux disease)    Hypercholesterolemia    Hypertension    Seizures ([facility])    Type II or unspecified type diabetes mellitus without mention of complication, not stated as uncontrolled     Patient Active Problem List   Diagnosis Date Noted   Vitamin D deficiency 10/08/2017   COPD (chronic obstructive pulmonary disease) ([place]) 07/08/2017   Morbid obesity ([place]) 12/22/2015   Encounter for Medicare annual wellness exam 12/22/2015   Hyperlipidemia, mixed    Hypertension    Diabetes mellitus due to underlying condition with stage 2 chronic kidney disease ([facility])    Seizures ([facility])    BPH (benign prostatic hyperplasia)    GERD (gastroesophageal reflux disease)     Past Surgical History:  Procedure Laterality Date   HAND SURGERY Left 1953   KNEE  ARTHROSCOPY Left 1998       Family History  Problem Relation Age of Onset   CVA Mother    Thyroid disease Mother    Heart disease Father    Diabetes Sister    Heart disease Sister    Diabetes Brother    Heart disease Brother    Epilepsy Brother    Schizophrenia Brother     Social History   Tobacco Use   Smoking status: Former    Pack years: 0.00    Types: Cigarettes    Quit date: 12/17/2013    Years since quitting: 7.4   Smokeless tobacco: Never  Vaping Use   Vaping Use: Never used  Substance Use Topics   Alcohol use: No    Alcohol/week: 0.0 standard drinks   Drug use: No    Home Medications Prior to Admission medications   Medication Sig Start Date End Date Taking? Authorizing Provider  albuterol (VENTOLIN HFA) 108 (90 Base) MCG/ACT inhaler Inhale 1-2 puffs into the lungs every 6 (six) hours as needed for wheezing or shortness of breath. 11/17/20   Scot Jun, FNP  amLODipine (NORVASC) 5 MG tablet Take 5 mg by mouth daily.    [provider]  aspirin 81 MG chewable tablet Chew 81 mg by mouth daily.    [provider]  budesonide-formoterol (SYMBICORT) 80-4.5 MCG/ACT inhaler Inhale 2 puffs into the lungs 2 (two) times daily. 11/17/20   Scot Jun,  FNP  cetirizine (ZYRTEC) 10 MG tablet Take 1 tablet (10 mg total) by mouth daily. 02/29/20   Loura Halt A, NP  divalproex (DEPAKOTE ER) 500 MG 24 hr tablet Take 1 tablet (500 mg total) by mouth 2 (two) times daily. 10/11/17   Unk Pinto, MD  doxycycline (VIBRA-TABS) 100 MG tablet Take 1 tablet (100 mg total) by mouth 2 (two) times daily. 12/02/20   Volney American, PA-C  finasteride (PROSCAR) 5 MG tablet Take 5 mg by mouth every evening.    [provider]  fluticasone (FLONASE) 50 MCG/ACT nasal spray Place 2 sprays into both nostrils daily. Patient taking differently: Place 2 sprays into both nostrils daily as needed for allergies.  12/05/15   Rolene Course, PA-C   fluticasone furoate-vilanterol (BREO ELLIPTA) 100-25 MCG/INH AEPB Inhale 1 puff into the lungs daily. 02/29/20   Zigmund Gottron, NP  furosemide (LASIX) 20 MG tablet Take 1 tablet (20 mg total) by mouth daily. 11/02/18   Katy Apo, NP  glipiZIDE (GLUCOTROL) 10 MG tablet Take 10 mg by mouth 2 (two) times daily before a meal.    [provider]  ipratropium-albuterol (DUONEB) 0.5-2.5 (3) MG/3ML SOLN Take 3 mLs by nebulization every 6 (six) hours as needed (wheezing, shortness of breath). 03/16/19   Wieters, Hallie C, PA-C  lisinopril (PRINIVIL,ZESTRIL) 40 MG tablet Take 40 mg by mouth daily.    [provider]  MAGNESIUM PO Take 420 mg by mouth 2 (two) times daily.    [provider]  metFORMIN (GLUCOPHAGE XR) 500 MG 24 hr tablet Take 2 tablets 2 x / day for Diabetes Patient taking differently: Take 500 mg by mouth 3 (three) times daily.  10/02/17 02/29/20  Unk Pinto, MD  Multiple Vitamin (MULTIVITAMIN) tablet Take 1 tablet by mouth daily.    [provider]  Multiple Vitamins-Minerals (ICAPS AREDS FORMULA PO) Take 1 tablet by mouth daily.    [provider]  promethazine-codeine (PHENERGAN WITH CODEINE) 6.25-10 MG/5ML syrup Take 5 mLs by mouth every 6 (six) hours as needed for cough. 11/17/20   Scot Jun, FNP  rosuvastatin (CRESTOR) 40 MG tablet Take 40 mg by mouth daily.    [provider]    Allergies    Penicillins and Latex  Review of Systems   Review of Systems  Constitutional:  Positive for fever (subjective).  HENT:  Positive for congestion and sore throat.   Respiratory:  Positive for cough, shortness of breath and wheezing.   Cardiovascular:  Negative for chest pain and leg swelling.  Gastrointestinal:  Positive for diarrhea. Negative for abdominal pain, blood in stool, nausea and vomiting.  Genitourinary:  Negative for dysuria.  Neurological:  Negative for syncope.  All other systems reviewed and are  negative.  Physical Exam Updated Vital Signs BP 105/63   Pulse 93   Temp 98.6 F (37 C) (Oral)   Resp 18   SpO2 95%   Physical Exam Vitals and nursing note reviewed.  Constitutional:      General: He is not in acute distress.    Appearance: He is well-developed. He is not toxic-appearing.  HENT:     Head: Normocephalic and atraumatic.     Nose: Congestion present.     Comments: No sinus tenderness.     Mouth/Throat:     Pharynx: Oropharynx is clear.  Eyes:     General:        Right eye: No discharge.  Left eye: No discharge.     Conjunctiva/sclera: Conjunctivae normal.  Cardiovascular:     Rate and Rhythm: Normal rate and regular rhythm.  Pulmonary:     Effort: Pulmonary effort is normal. No respiratory distress.     Breath sounds: Normal breath sounds. No wheezing, rhonchi or rales.  Abdominal:     General: There is no distension.     Palpations: Abdomen is soft.     Tenderness: There is abdominal tenderness (LLQ). There is no guarding or rebound.  Musculoskeletal:     Cervical back: Neck supple.     Right lower leg: No edema.     Left lower leg: No edema.  Skin:    General: Skin is warm and dry.  Neurological:     Mental Status: He is alert.     Comments: Clear speech.   Psychiatric:        Behavior: Behavior normal.    ED Results / Procedures / Treatments   Labs (all labs ordered are listed, but only abnormal results are displayed) Labs Reviewed  COMPREHENSIVE METABOLIC PANEL - Abnormal; Notable for the following components:      Result Value   Glucose, Bld 220 (*)    Creatinine, Ser 1.32 (*)    Albumin 2.8 (*)    AST 59 (*)    ALT 51 (*)    Alkaline Phosphatase 179 (*)    Total Bilirubin 1.5 (*)    GFR, Estimated 57 (*)    All other components within normal limits  CBC - Abnormal; Notable for the following components:   WBC 16.6 (*)    Hemoglobin 10.6 (*)    HCT 36.4 (*)    MCH 23.7 (*)    MCHC 29.1 (*)    RDW 17.6 (*)    Platelets 598  (*)    nRBC 0.3 (*)    All other components within normal limits  RESP PANEL BY RT-PCR (FLU A&B, COVID) ARPGX2  LIPASE, BLOOD  URINALYSIS, ROUTINE W REFLEX MICROSCOPIC    EKG None  Radiology DG Chest 2 View  Result Date: 06/15/2021 CLINICAL DATA:  Shortness of breath with cough and congestion EXAM: CHEST - 2 VIEW COMPARISON:  November 17, 2020 FINDINGS: Lungs are clear. Heart size and pulmonary vascularity are normal. No adenopathy. There is aortic atherosclerosis. No bone lesions. IMPRESSION: Lungs clear. Cardiac silhouette normal. Aortic Atherosclerosis (ICD10-I70.0). Electronically Signed   By: Lowella Grip III M.D.   On: 06/15/2021 09:20   CT Abdomen Pelvis W Contrast  Result Date: 06/15/2021 CLINICAL DATA:  Left lower quadrant abdominal pain EXAM: CT ABDOMEN AND PELVIS WITH CONTRAST TECHNIQUE: Multidetector CT imaging of the abdomen and pelvis was performed using the standard protocol following bolus administration of intravenous contrast. CONTRAST:  157mL OMNIPAQUE IOHEXOL 350 MG/ML SOLN COMPARISON:  None. FINDINGS: Lower chest: Clustered, heterogeneous airspace disease in the right greater than left lung bases (series 5, image 10). Coronary artery calcifications. Hepatobiliary: Large, somewhat ill-defined, masslike heterogeneous lesion of the anterior right lobe of the liver, with areas of heterogeneous hypodensity and capsular retraction, the central, dominant component measuring approximately 11.7 x 6.8 cm (series 3, image 18). Small gallstones near the gallbladder neck. Pancreas: Unremarkable. No pancreatic ductal dilatation or surrounding inflammatory changes. Spleen: Normal in size without significant abnormality. Adrenals/Urinary Tract: Adrenal glands are unremarkable. Kidneys are normal, without renal calculi, solid lesion, or hydronephrosis. Bladder is unremarkable. Stomach/Bowel: Stomach is within normal limits. Appendix appears normal. No evidence of bowel wall thickening,  distention, or inflammatory changes. Vascular/Lymphatic: Aortic atherosclerosis. No enlarged abdominal or pelvic lymph nodes. Reproductive: No mass or other significant abnormality. Other: No abdominal wall hernia or abnormality. No abdominopelvic ascites. Musculoskeletal: No acute or significant osseous findings. IMPRESSION: 1. No acute CT findings of the abdomen or pelvis to explain left lower quadrant abdominal pain. 2. Clustered, heterogeneous airspace disease in the right greater than left lung bases , concerning for infection or aspiration. 3. Large, somewhat ill-defined, masslike heterogeneous lesion of the anterior right lobe of the liver, with areas of heterogeneous hypodensity and capsular retraction, the central, dominant component measuring approximately 11.7 x 6.8 cm This is of uncertain nature but concerning for malignancy, particularly intrahepatic cholangiocarcinoma. Recommend nonemergent multiphasic contrast enhanced MRI to further evaluate. 4. Cholelithiasis. 5. Coronary artery disease. Aortic Atherosclerosis (ICD10-I70.0). Electronically Signed   By: Eddie Candle M.D.   On: 06/15/2021 12:47    Procedures Procedures   Medications Ordered in ED Medications  albuterol (VENTOLIN HFA) 108 (90 Base) MCG/ACT inhaler 6 puff (6 puffs Inhalation Given 06/15/21 0939)  AeroChamber Plus Flo-Vu Large MISC 1 each (1 each Other Given 06/15/21 5631)    ED Course  I have reviewed the triage vital signs and the nursing notes.  Pertinent labs & imaging results that were available during my care of the patient were reviewed by me and considered in my medical decision making (see chart for details).  FREDERIC TONES was evaluated in Emergency Department on 06/15/2021 for the symptoms described in the history of present illness. He/she was evaluated in the context of the global COVID-19 pandemic, which necessitated consideration that the patient might be at risk for infection with the SARS-CoV-2 virus that  causes COVID-19. Institutional protocols and algorithms that pertain to the evaluation of patients at risk for COVID-19 are in a state of rapid change based on information released by regulatory bodies including the CDC and federal and state organizations. These policies and algorithms were followed during the patient's care in the ED.    MDM Rules/Calculators/A&P                          Patient presents to the ED with complaints of respiratory symptoms for the past 1 week and diarrhea over the past couple of days.  Patient is nontoxic, tachycardic on arrival.  No obvious wheezing on exam, given history of COPD will trial albuterol inhaler.  Additional history obtained:  Additional history obtained from chart review & nursing note review.   Lab Tests:  I Ordered, reviewed, and interpreted labs, which included:  CBC: Leukocytosis to 16.6, mild anemia, thrombocytosis CMP: Mild transaminitis with elevated alkaline phosphatase and total bilirubin.  Creatinine mildly increased, most recent on record is from 3 years prior. Lipase: Within normal limits COVID/flu testing: Negative   Imaging Studies ordered:  I ordered imaging studies which included chest x-ray and CT abdomen/pelvis with contrast, I independently reviewed, formal radiology impression shows:  [id number] clear. Cardiac silhouette normal. Aortic Atherosclerosis CT A/P: 1. No acute CT findings of the abdomen or pelvis to explain left lower quadrant abdominal pain. 2. Clustered, heterogeneous airspace disease in the right greater than left lung bases , concerning for infection or aspiration. 3. Large, somewhat ill-defined, masslike heterogeneous lesion of the anterior right lobe of the liver, with areas of heterogeneous hypodensity and capsular retraction, the central, dominant component measuring approximately 11.7 x 6.8 cm This is of uncertain nature but concerning for malignancy, particularly intrahepatic  cholangiocarcinoma. Recommend  nonemergent multiphasic contrast enhanced MRI to further evaluate. 4. Cholelithiasis. 5. Coronary artery disease. Aortic Atherosclerosis  ED Course:  CT with airspace disease at the lung bases, clinically patient presentation consistent with pneumonia..  He has associated leukocytosis and tachycardia.  Lactic acid and blood cultures ordered.  Antibiotics ordered.  Patient also with suspicious mass on CT imaging, per his wife he also has a suspicious mass in his heart that he associated MRI for in the future, overall concern for malignancy.  Will discuss with medicine for admission.  Findings and plan of care discussed with supervising physician Dr. Johnney Killian who is in agreement.  14:58: CONSULT: Discussed with internal medicine residency service- accept admission.   Portions of this note were generated with [employer]. Dictation errors may occur despite best attempts at proofreading.  Final Clinical Impression(s) / ED Diagnoses Final diagnoses:  Community acquired pneumonia, unspecified laterality  Liver mass    Rx / DC Orders ED Discharge Orders     None        Amaryllis Dyke, PA-C 06/15/21 1459    Charlesetta Shanks, MD 06/21/21 1038

## 2021-06-15 NOTE — ED Triage Notes (Signed)
Patient complains of cough, congestion, and diarrhea that started five days ago. Patient afebrile in triage. Patient alert, oriented, and in no apparent distress at this time.

## 2021-06-15 NOTE — H&P (Addendum)
Date: 06/15/2021               Patient Name:  Trevor [name] MRN: [id number]  DOB: 1946-06-27 Age / Sex: 75 y.o., male   PCP: [facility] Service: Internal Medicine Teaching Service              Attending Physician: Dr. Evette Doffing, Mallie Mussel, *    First Contact: Ok Edwards, [place] 4 Pager: [phone]  Second Contact: Dr. Maudie Mercury Pager: [phone]            After Hours (After 5p/  First Contact Pager: [phone]  weekends / holidays): Second Contact Pager: [phone]   Chief Complaint: Productive Cough   History of Present Illness:   Mr. Chukwuemeka Artola is a 75 year old male with a history of COPD, diabetes mellitus, HTN, hypercholesteremia, seizures and BPH who presents with productive cough and congestion.   The cough began approximately 2 weeks ago and is accompanied with green-colored sputum. He denies chest pain and shortness of breath feels that the mucus is clogged up in the his upper chest and is unable to get it out. He has tried using Albuterol which minimally alleviates the cough. He feels subjectively that he is warm but this morning  when he felt similarly warm, his temperature was was 98 degrees F. There are no known sick contacts. As per wife, reason for coming to the ED is the development of diarrhea with these symptoms.   Of note, he slipped out of bed this morning while getting up to use the restroom and lay on the floor for many hours until his wife could help him up upon awakening. He did not lose consciousness or injure his head during this fall.   Patient's wife self reports that a mass in the upper heart chamber was visualized in an echo completed at their last [facility] appointment. He endorses that he sleeps in a bed with the head angled up and does tend to develop swelling in his legs often. Wife states that he tends to get dizzy occasionally. He is not dizzy at this time. He is not on home oxygen.  Additionally, the patient states that he has  lost about 37 pounds over the last few month which he attributes to poor PO intake due to abdominal discomfort and diarrhea preceded by tenesmus. He recently had an EGD and Colonoscopy at the [facility] which were unremarkable. He denies abdominal pain, bloody stools or melena.    Meds:  divalproex  500 mg Oral BID   enoxaparin (LOVENOX) injection  30 mg Subcutaneous Q24H   finasteride  5 mg Oral QPM   fluticasone furoate-vilanterol  1 puff Inhalation Daily   guaiFENesin  600 mg Oral BID   insulin aspart  0-9 Units Subcutaneous TID WC   rosuvastatin  10 mg Oral QHS      Allergies: Allergies as of 06/15/2021 - Review Complete 06/15/2021  Allergen Reaction Noted   Atorvastatin Other (See Comments) 11/14/2009   Latex Hives and Rash 12/17/2012   Penicillin g Rash 09/13/2009   Penicillins Rash 12/17/2012   Past Medical History:  Diagnosis Date   BPH (benign prostatic hyperplasia)    COPD (chronic obstructive pulmonary disease) (HCC)    COPD (chronic obstructive pulmonary disease) ([place])    COVID-19    Diabetes mellitus without complication (HCC)    GERD (gastroesophageal reflux disease)    Hypercholesterolemia  Hypertension    Seizures ([place])    Type II or unspecified type diabetes mellitus without mention of complication, not stated as uncontrolled     Family History: Mother- CVA, Thyroid disease       Father- Heart disease                             Brother- Diabetes, heart disease, epilepsy                             Sister- Diabetes, Heart disease  Social History: Patient lives at home with his wife. He smoked a pack a day of cigarettes for 50 years but quit about 11 years ago. He has a remote history of alcohol use. Denies recreational drug use.   Review of Systems: A complete ROS was negative except as per HPI.   Physical Exam: Blood pressure 132/66, pulse 75, temperature 98.6 F (37 C), temperature source Oral, resp. rate 11, SpO2 100 %. Constitutional: weak-appearing  male, laying in bed, in no acute distress HENT: normocephalic atraumatic Eyes: conjunctiva non-erythematous, no scleral icterus Cardiovascular: regular rate and rhythm, muffled heart sounds,, no m/r/g, trace pitting edema present in BLEs Pulmonary/Chest: normal work of breathing on room air, decreased breath sounds in left lower lung field Abdominal: soft, non-tender, distended MSK: normal bulk and tone Neurological: alert & oriented x 3, no focal deficits Skin: upper and lower extremities cool to touch bilaterally Psych: affect mood congruent   EKG: personally reviewed my interpretation is no ST- elevations or ST- Depressions.   CXR: personally reviewed my interpretation is no cardiomegaly, no tracheal deviation, no consolidations  Assessment & Plan by Problem: Active Problems:   Pneumonia   Acute kidney injury (HCC)   Normocytic anemia  Cough, congestion, and Leukocytosis; Concern for Pneumonia Patient presents with a two-week history of cough, congestion, subjective fevers and leukocytosis of 16.6. CXR unremarkable and CTAP shows heterogenous airspace disease in R>L lung base. Lung exam significant for decreased breath sounds in lower lung bases. UA is unremarkable except for >500 glucose. Covid test was negative. Most likely etiology at this time is viral/ bacterial pneumonia, given leukocytosis and green sputum. Of note, patient has also been endorsing diarrhea raising clinical suspicion for Legionnaire's disease. Vascular congestion may also be playing a part in the patient's presentation given reported orthopnea and trace pitting edema on exam. We could also consider COPD exacerbation given mild improvement in cough with albuterol, however this is very unlikely given the absence of tachypnea and oxygen requirement. At this time will continue broad-spectrum antibiotic coverage for CAP, but will discontinue it if procalcitonin is unremarkable. - follow up procalcitonin, respiratory  pathogen panel - follow up Legionella antigen - follow up Strep Pneumo urine antigen - continue ceftriaxone and vancomycin; consider discontinue pending procalcitonin result - follow up BNP - follow up Bcx - daily CBC  Acute Kidney Injury Patient presents with creatinine elevated to 1.32 (baseline .8-1) and lactic acid elevated to 2.2. Initial soft Bps with tachycardia up to 107 in ED. Blood pressure improved s/p 1500L NS. Differential includes prerenal AKI 2/2 to hypovolemia vs. Rhabdomyolysis given recent fall and immobilization on floor. Will start maintenance fluids, pursue rhabdo work-up, and continue to monitor creatinine. - IVF NS - follow up CK - daily BMP  Hepatic Mass CTAP showed an ill-defined mass-like heterogenous lesion in the anterior right lobe of liver with  capsule and central component 11.7 x 6.8 cm. Likely malignant given heterogenous composition and ill-defined borders. Will work-up outpatient given non-emergent nature.  - Consider outpatient multiphasic contrast enhanced MRI  Normocytic Anemia CBC shows Hgb of 10.6 and Hct of 36.4 with MCV of 81.4. Given normal MCV unlikely to be iron deficiency anemia megaloblastic anemia 2/2 folate or B12 deficiency. He has recently had a normal colonoscopy and EGD making GI bleed less likely. Of note platelets are elevated to 598. Potential etiology could be anemia of chronic inflammation or hemolysis-induced.  - follow/up Reticulocyte count  Cardiac Mass Self reports that a cardiac mass was visualized on a recent echo. Unable to access echo results at this time as patient obtains care at the [place] and they were not reachable by phone at the time of admission.  Will continue to attempt to obtain records for diagnostic clarification.  - Contact [facility]   Dispo: Admit patient to Inpatient with expected length of stay greater than 2 midnights.  SignedOk Edwards, Medical Student 06/15/2021, 6:01 PM  Pager:  [phone]  Attestation for Student Documentation:  I personally was present and performed or re-performed the history, physical exam and medical decision-making activities of this service and have verified that the service and findings are accurately documented in the student's note.  Maudie Mercury, MD 06/15/2021, 7:46 PM

## 2021-06-15 NOTE — Hospital Course (Addendum)
Trevor [name] is a 75 year old male with a past medical history of  COPD, Diabetes Mellitus, HTN, hypercholesteremia, seizures and BPH who presented to [facility] with Community- Acquired Pneumonia and a newly found Hepatic Mass concerning for malignancy.   Community Acquired Pneumonia Trevor [name] presented with a 2 week history of cough productive of green-colored sputum, congestion, and leukocytosis of 16.6. Chest x-ray unremarkable but CTAP showed heterogenous airspace disease bilaterally. Procalcitonin was elevated to .83, strep. Pneumonia and legionella antigen were negative. He received a 5 day course of Azithromycin and Ceftriaxone for community-acquired pneumonia. By day of discharge his cough and breathing were markedly improved. White count down trended and remained stable at 11.8 by time of discharge.  Hepatic Mass,Cardiac Mass, Unintentional Weight Loss Trevor [name] was found to have a hepatic mass on CT Abdomen sized 14.4 x 9.4 x 9.4 cm, which was confirmed on Liver MRI. LDH and AFP makers were also elevated. In the setting of his reported unintentional weight loss, there was concern for hepatic malignancy (hepatocellular carcinoma vs. Cholangiocarcinoma) hence IR liver biopsy was obtained. Biopsy pathology came back positive for Hepatocellular Carcinoma. Results were shared with [facility], for establishment of further cancer work-up/management. His wife reported that he was also found to have a cardiac mass on echocardiogram at the [place]. He had a repeat transthoracic echocardiogram during this admission which showed LVEF 60-65% with a normal function and no regional wall motion abnormalities as well as a large mass in the right atrium. TEE was obtained for better visualization of the mass which showed a free-floating large mass in the right atrium. CT surgery was consulted and they recommended cardiac MRI for further characterization and stated that no surgical intervention is indicated  during this admission. Will obtain outpatient cardiac MRI.  Iron Deficiency Anemia Throughout the course of the admission, his Hgb was stable around 9.0 and with MCV around 79. Iron studies showed a ferritin of 41, iron of 22, and saturation ratio of 5. Patient was transfused Ferraheme x1 during this admission. Recommend outpatient follow-up.

## 2021-06-16 ENCOUNTER — Observation Stay (HOSPITAL_BASED_OUTPATIENT_CLINIC_OR_DEPARTMENT_OTHER): Payer: No Typology Code available for payment source

## 2021-06-16 DIAGNOSIS — C220 Liver cell carcinoma: Secondary | ICD-10-CM | POA: Diagnosis present

## 2021-06-16 DIAGNOSIS — J440 Chronic obstructive pulmonary disease with acute lower respiratory infection: Secondary | ICD-10-CM | POA: Diagnosis present

## 2021-06-16 DIAGNOSIS — R008 Other abnormalities of heart beat: Secondary | ICD-10-CM | POA: Diagnosis not present

## 2021-06-16 DIAGNOSIS — N400 Enlarged prostate without lower urinary tract symptoms: Secondary | ICD-10-CM | POA: Diagnosis present

## 2021-06-16 DIAGNOSIS — I700 Atherosclerosis of aorta: Secondary | ICD-10-CM | POA: Diagnosis not present

## 2021-06-16 DIAGNOSIS — J189 Pneumonia, unspecified organism: Secondary | ICD-10-CM | POA: Diagnosis not present

## 2021-06-16 DIAGNOSIS — I5189 Other ill-defined heart diseases: Secondary | ICD-10-CM | POA: Diagnosis not present

## 2021-06-16 DIAGNOSIS — K219 Gastro-esophageal reflux disease without esophagitis: Secondary | ICD-10-CM | POA: Diagnosis present

## 2021-06-16 DIAGNOSIS — Z833 Family history of diabetes mellitus: Secondary | ICD-10-CM | POA: Diagnosis not present

## 2021-06-16 DIAGNOSIS — Z880 Allergy status to penicillin: Secondary | ICD-10-CM | POA: Diagnosis not present

## 2021-06-16 DIAGNOSIS — Z91040 Latex allergy status: Secondary | ICD-10-CM | POA: Diagnosis not present

## 2021-06-16 DIAGNOSIS — I340 Nonrheumatic mitral (valve) insufficiency: Secondary | ICD-10-CM | POA: Diagnosis not present

## 2021-06-16 DIAGNOSIS — Z823 Family history of stroke: Secondary | ICD-10-CM | POA: Diagnosis not present

## 2021-06-16 DIAGNOSIS — Z01810 Encounter for preprocedural cardiovascular examination: Secondary | ICD-10-CM | POA: Diagnosis not present

## 2021-06-16 DIAGNOSIS — R160 Hepatomegaly, not elsewhere classified: Secondary | ICD-10-CM | POA: Diagnosis not present

## 2021-06-16 DIAGNOSIS — I361 Nonrheumatic tricuspid (valve) insufficiency: Secondary | ICD-10-CM | POA: Diagnosis not present

## 2021-06-16 DIAGNOSIS — Z87891 Personal history of nicotine dependence: Secondary | ICD-10-CM | POA: Diagnosis not present

## 2021-06-16 DIAGNOSIS — N179 Acute kidney failure, unspecified: Secondary | ICD-10-CM | POA: Diagnosis present

## 2021-06-16 DIAGNOSIS — I10 Essential (primary) hypertension: Secondary | ICD-10-CM | POA: Diagnosis present

## 2021-06-16 DIAGNOSIS — E440 Moderate protein-calorie malnutrition: Secondary | ICD-10-CM | POA: Insufficient documentation

## 2021-06-16 DIAGNOSIS — Z820 Family history of epilepsy and other diseases of the nervous system: Secondary | ICD-10-CM | POA: Diagnosis not present

## 2021-06-16 DIAGNOSIS — Z8349 Family history of other endocrine, nutritional and metabolic diseases: Secondary | ICD-10-CM | POA: Diagnosis not present

## 2021-06-16 DIAGNOSIS — R197 Diarrhea, unspecified: Secondary | ICD-10-CM | POA: Diagnosis present

## 2021-06-16 DIAGNOSIS — E119 Type 2 diabetes mellitus without complications: Secondary | ICD-10-CM | POA: Diagnosis present

## 2021-06-16 DIAGNOSIS — Z8249 Family history of ischemic heart disease and other diseases of the circulatory system: Secondary | ICD-10-CM | POA: Diagnosis not present

## 2021-06-16 DIAGNOSIS — Z8616 Personal history of COVID-19: Secondary | ICD-10-CM | POA: Diagnosis not present

## 2021-06-16 DIAGNOSIS — W06XXXA Fall from bed, initial encounter: Secondary | ICD-10-CM | POA: Diagnosis present

## 2021-06-16 DIAGNOSIS — Z20822 Contact with and (suspected) exposure to covid-19: Secondary | ICD-10-CM | POA: Diagnosis present

## 2021-06-16 DIAGNOSIS — J159 Unspecified bacterial pneumonia: Secondary | ICD-10-CM | POA: Diagnosis present

## 2021-06-16 DIAGNOSIS — E7800 Pure hypercholesterolemia, unspecified: Secondary | ICD-10-CM | POA: Diagnosis present

## 2021-06-16 LAB — HEMOGLOBIN A1C
Hgb A1c MFr Bld: 6.7 % — ABNORMAL HIGH (ref 4.8–5.6)
Mean Plasma Glucose: 146 mg/dL

## 2021-06-16 LAB — COMPREHENSIVE METABOLIC PANEL
ALT: 46 U/L — ABNORMAL HIGH (ref 0–44)
AST: 48 U/L — ABNORMAL HIGH (ref 15–41)
Albumin: 2.2 g/dL — ABNORMAL LOW (ref 3.5–5.0)
Alkaline Phosphatase: 156 U/L — ABNORMAL HIGH (ref 38–126)
Anion gap: 10 (ref 5–15)
BUN: 15 mg/dL (ref 8–23)
CO2: 22 mmol/L (ref 22–32)
Calcium: 9 mg/dL (ref 8.9–10.3)
Chloride: 108 mmol/L (ref 98–111)
Creatinine, Ser: 1.06 mg/dL (ref 0.61–1.24)
GFR, Estimated: >60 mL/min (ref >60)
Glucose, Bld: 202 mg/dL — ABNORMAL HIGH (ref 70–99)
Potassium: 4 mmol/L (ref 3.5–5.1)
Sodium: 140 mmol/L (ref 135–145)
Total Bilirubin: 1.2 mg/dL (ref 0.3–1.2)
Total Protein: 5.7 g/dL — ABNORMAL LOW (ref 6.5–8.1)

## 2021-06-16 LAB — BLOOD CULTURE ID PANEL (REFLEXED) - BCID2

## 2021-06-16 LAB — CBC
HCT: 30.9 % — ABNORMAL LOW (ref 39.0–52.0)
Hemoglobin: 9.1 g/dL — ABNORMAL LOW (ref 13.0–17.0)
MCH: 23.8 pg — ABNORMAL LOW (ref 26.0–34.0)
MCHC: 29.4 g/dL — ABNORMAL LOW (ref 30.0–36.0)
MCV: 80.9 fL (ref 80.0–100.0)
Platelets: 450 10*3/uL — ABNORMAL HIGH (ref 150–400)
RBC: 3.82 MIL/uL — ABNORMAL LOW (ref 4.22–5.81)
RDW: 17.7 % — ABNORMAL HIGH (ref 11.5–15.5)
WBC: 11.8 10*3/uL — ABNORMAL HIGH (ref 4.0–10.5)
nRBC: 0.3 % — ABNORMAL HIGH (ref 0.0–0.2)

## 2021-06-16 LAB — STREP PNEUMONIAE URINARY ANTIGEN: Strep Pneumo Urinary Antigen: NEGATIVE

## 2021-06-16 LAB — CBC WITH DIFFERENTIAL/PLATELET
Abs Immature Granulocytes: 0.27 10*3/uL — ABNORMAL HIGH (ref 0.00–0.07)
Basophils Absolute: 0.1 10*3/uL (ref 0.0–0.1)
Basophils Relative: 1 %
Eosinophils Absolute: 0.2 10*3/uL (ref 0.0–0.5)
Eosinophils Relative: 1 %
HCT: 30.9 % — ABNORMAL LOW (ref 39.0–52.0)
Hemoglobin: 9 g/dL — ABNORMAL LOW (ref 13.0–17.0)
Immature Granulocytes: 2 %
Lymphocytes Relative: 21 %
Lymphs Abs: 2.3 10*3/uL (ref 0.7–4.0)
MCH: 23.3 pg — ABNORMAL LOW (ref 26.0–34.0)
MCHC: 29.1 g/dL — ABNORMAL LOW (ref 30.0–36.0)
MCV: 79.8 fL — ABNORMAL LOW (ref 80.0–100.0)
Monocytes Absolute: 1.4 10*3/uL — ABNORMAL HIGH (ref 0.1–1.0)
Monocytes Relative: 12 %
Neutro Abs: 6.9 10*3/uL (ref 1.7–7.7)
Neutrophils Relative %: 63 %
Platelets: 467 10*3/uL — ABNORMAL HIGH (ref 150–400)
RBC: 3.87 MIL/uL — ABNORMAL LOW (ref 4.22–5.81)
RDW: 17.6 % — ABNORMAL HIGH (ref 11.5–15.5)
WBC: 11.1 10*3/uL — ABNORMAL HIGH (ref 4.0–10.5)
nRBC: 0 % (ref 0.0–0.2)

## 2021-06-16 LAB — GLUCOSE, CAPILLARY
Glucose-Capillary: 141 mg/dL — ABNORMAL HIGH (ref 70–99)
Glucose-Capillary: 208 mg/dL — ABNORMAL HIGH (ref 70–99)
Glucose-Capillary: 188 mg/dL — ABNORMAL HIGH (ref 70–99)
Glucose-Capillary: 173 mg/dL — ABNORMAL HIGH (ref 70–99)

## 2021-06-16 LAB — ECHOCARDIOGRAM COMPLETE
Area-P 1/2: 3.6 cm2
Calc EF: 52 %
Height: 71 in
S' Lateral: 3.9 cm
Single Plane A2C EF: 49.3 %
Single Plane A4C EF: 53.4 %
Weight: 2836 oz

## 2021-06-16 LAB — LACTIC ACID, PLASMA: Lactic Acid, Venous: 1.2 mmol/L (ref 0.5–1.9)

## 2021-06-16 LAB — IRON AND TIBC
Iron: 22 ug/dL — ABNORMAL LOW (ref 45–182)
Saturation Ratios: 5 % — ABNORMAL LOW (ref 17.9–39.5)
TIBC: 405 ug/dL (ref 250–450)
UIBC: 383 ug/dL

## 2021-06-16 LAB — HEPATITIS B SURFACE ANTIGEN: Hepatitis B Surface Ag: NONREACTIVE

## 2021-06-16 LAB — HEPATITIS B CORE ANTIBODY, IGM: Hep B C IgM: NONREACTIVE

## 2021-06-16 LAB — PROCALCITONIN: Procalcitonin: 0.83 ng/mL

## 2021-06-16 LAB — HEPATITIS B CORE ANTIBODY, TOTAL: Hep B Core Total Ab: NONREACTIVE

## 2021-06-16 LAB — HEPATITIS C ANTIBODY: HCV Ab: NONREACTIVE

## 2021-06-16 LAB — FERRITIN: Ferritin: 41 ng/mL (ref 24–336)

## 2021-06-16 MED ORDER — ADULT MULTIVITAMIN W/MINERALS CH
1.0000 | ORAL_TABLET | Freq: Every day | ORAL | Status: DC
  Administered 2021-06-16 – 2021-06-22 (×7): 1 via ORAL
  Filled 2021-06-16 (×7): qty 1

## 2021-06-16 MED ORDER — PERFLUTREN LIPID MICROSPHERE
1.0000 mL | INTRAVENOUS | Status: AC | PRN
  Administered 2021-06-16: 2 mL via INTRAVENOUS
  Filled 2021-06-16: qty 10

## 2021-06-16 MED ORDER — SODIUM CHLORIDE 0.9 % IV SOLN
1.0000 g | INTRAVENOUS | Status: AC
  Administered 2021-06-16 – 2021-06-20 (×5): 1 g via INTRAVENOUS
  Filled 2021-06-16: qty 10
  Filled 2021-06-16 (×2): qty 1
  Filled 2021-06-16: qty 10
  Filled 2021-06-16: qty 1

## 2021-06-16 MED ORDER — AZITHROMYCIN 250 MG PO TABS
250.0000 mg | ORAL_TABLET | Freq: Every day | ORAL | Status: AC
  Administered 2021-06-16 – 2021-06-19 (×4): 250 mg via ORAL
  Filled 2021-06-16 (×4): qty 1

## 2021-06-16 MED ORDER — LORAZEPAM 1 MG PO TABS
1.0000 mg | ORAL_TABLET | Freq: Once | ORAL | Status: AC
  Administered 2021-06-17: 1 mg via ORAL
  Filled 2021-06-16: qty 1

## 2021-06-16 MED ORDER — ENOXAPARIN SODIUM 40 MG/0.4ML IJ SOSY
40.0000 mg | PREFILLED_SYRINGE | INTRAMUSCULAR | Status: DC
  Administered 2021-06-16 – 2021-06-18 (×3): 40 mg via SUBCUTANEOUS
  Filled 2021-06-16 (×3): qty 0.4

## 2021-06-16 NOTE — Progress Notes (Signed)
Patient Summary: [name] is a 75 year old male with a past medical history of COPD, Hypertension, Hypercholesteremia, BPH and seizures who presents to [facility] for a 2 week history of cough and congestion likely due to pneumonia, diarrhea, and a new hepatic mass.   Subjective: No acute events overnight with the exception of soft blood pressures (102/40) which improved with fluid resuscitation. He states that his cough and congestion is improved from admission yesterday. He has not had a bowel movement in the last 24 hours. He does complain of a diffuse itchy rash that continues to bother him.  He denies chest pain. Wife states that they have had a difficult time obtaining consistent care at the [place].  Objective:  Vital signs in last 24 hours: Vitals:   06/15/21 2055 06/15/21 2131 06/16/21 0510 06/16/21 1245  BP:   140/62 134/67  Pulse:   87 85  Resp:   17 18  Temp:   98.1 F (36.7 C) 97.8 F (36.6 C)  TempSrc: Oral  Oral Oral  SpO2:   97% 97%  Weight:  80.4 kg    Height:  5\' 11"  (1.803 m)     Weight change:   Intake/Output Summary (Last 24 hours) at 06/16/2021 1303 Last data filed at 06/15/2021 1601 Gross per 24 hour  Intake 1100 ml  Output --  Net 1100 ml   Physical Exam:  Constitutional: comfortable-appearing, kind, elderly male, in no acute distress HENT: normocephalic atraumatic Eyes: conjunctiva non-erythematous, no scleral icterus Cardiovascular: regular rate and rhythm, no m/r/g Pulmonary/Chest: normal work of breathing on room air, right lower lobe dullness to percussion, decreased breath sounds in lower lobes  Abdominal: soft, non-tender, non-distended, slight hepatomegaly MSK: No gross deficits Neurological: alert & oriented x 3, no focal deficits Skin: well perfused, spider angiomata present diffusely on chest and lower extremities Psych: affect mood congruent   Assessment/Plan:  Principal Problem:   Pneumonia Active Problems:   Acute kidney injury  (HCC)   Normocytic anemia   Right atrial mass   Liver mass  Community- Acquired Pneumonia Patient presented with a 2 week history of cough, congestion, leukocytosis of 16.6 and CTAP remarkable for bilateral opacities in the lower lobes bilaterally. PCT elevated to 0.83. Blood cultures show no growth @ 24 hours. He has completed one day of antibiotic therapy with Azithromycin and Ceftriaxone. Cough and congestion are improving from admission, leukocytosis down trending 16.6-> 11.8. Normal work of breathing on room air. Given clinical improvement, will continue antibiotic treatment and continue to monitor.  - continue IV Ceftriaxone 1 mg (day 2/5) - Transition IV Azithromycin to 250 mg oral (day 1/4).  - follow up urine strep Pneumo, Legionella antigen, and Respiratory Pathogen panel  Acute Kidney Injury (Resolved) Creatinine was elevated to 1.32 on admission in the setting of hypotension and tachycardia on admission, now improved to 1.06 s/p fluid resuscitation. Likely pre-renal etiology due to poor PO intake and diarrhea as opposed to rhabdomyolysis as CK was 24.  - daily BMP - routine vitals  Hepatic Mass, Cardiac Mass, Diarrhea; Concern for Malignancy CTAP on admission showed an ill-defined heterogenous mass with an internal component sized 11.7 x 6.8 cm surrounded by a capsule. ROS is positive for occasional abdominal pain, intermittent diarrhea, unintentional weight loss of 37 pounds but negative for melena or bloody stools. Of note, wife reports that recent echo done at last [place] appointment showed a cardiac mass, however those records are not available to [place]. When considering the  hepatic mass in isolation with the unintentional weight loss, there is concern for hepatic malignancy such as intrahepatic cholangiocarcinoma and hepatocellular carcinoma. However, the constellation of findings could be explained by Carcinoid syndrome. Given the perceived lack of consistent care at the [place], we will  obtain a formal echo and further abdominal imaging for diagnostic clarification. - Formal TTE - multiphasic contrast enhanced MRI - CBC w/ diff  Hypoproliferative Normocytic Anemia Admission Hgb 10.6 with MCV of 81.4.  Hgb 9.1 and MCV 80.9 today. Reticulocyte count is 2% with index of 1.4 indicative of hypoproliferative anemia.Recent colonoscopy and EGD done at [facility] reportedly unremarkable. Differential at this time includes early stage of iron-deficiency anemia vs. bone marrow pathology. Will obtain ferritin and iron studies for diagnostic clarification; if unremarkable may also consider peripheral smear given existing concern for malignancy as mentioned above. - ferritin - iron panel - consider peripheral smear if above studies unrevealing   LOS: 0 days   Ok Edwards, Medical Student 06/16/2021, 1:03 PM

## 2021-06-16 NOTE — Progress Notes (Signed)
Initial Nutrition Assessment  DOCUMENTATION CODES:   Non-severe (moderate) malnutrition in context of chronic illness  INTERVENTION:   -D/c Ensure Enlive po BID, each supplement provides 350 kcal and 20 grams of protein  -MVI with minerals daily -Magic cup TID with meals, each supplement provides 290 kcal and 9 grams of protein  -Manager's check with all meal trays   NUTRITION DIAGNOSIS:   Moderate Malnutrition related to chronic illness (COPD) as evidenced by mild fat depletion, mild muscle depletion.  GOAL:   Patient will meet greater than or equal to 90% of their needs  MONITOR:   PO intake, Supplement acceptance, Labs, Weight trends, Skin, I & O's  REASON FOR ASSESSMENT:   Malnutrition Screening Tool    ASSESSMENT:   Trevor [name] is a 75 year old male with a history of COPD, diabetes mellitus, HTN, hypercholesteremia, seizures and BPH who presents with productive cough and congestion.  Pt admitted with cough, congestion, and leukocytosis, concerning for PNA.   Reviewed I/O's: +1.6 L x 24 hours  Per MD notes, pt with incidental finding of liver mass and plan for MRI of liver this admission.   Spoke with pt, wife, and daughter at bedside. Per wife, pt has experienced a general decline health over the past 3 months, including decreased appetite, weakness/ falls, and diarrhea. Pt's intake has been very erratic; some days he will eat well, other days he will not. Per pt, he usually consumes 2 meals per day (Breakfast: oatmeal OR eggs, toast, and gravy; Dinner: meat, starch, and vegetable; Dinner: bowl of cereal). Pt consumed all of his breakfast (oatmeal and eggs), but complained that there were several missing items (toast and fruit cup).   Per pt wife, pt has lost about 35# over the past month. Reviewed wt hx; wt has been stable over the past year.   Discussed importance of good meal and supplement intake. Pt hesitant to try Ensure, as his PCP did not recommend it. He  is amenable to [employer].   Lab Results  Component Value Date   HGBA1C 6.7 (H) 06/15/2021   PTA DM medications are 500 mg metformin TID and 10 mg glipizide BID.   Labs reviewed: CBGS: 141-239 (inpatient orders for glycemic control are 0-9 units insulin aspart TID with meals).    NUTRITION - FOCUSED PHYSICAL EXAM:  Flowsheet Row Most Recent Value  Orbital Region No depletion  Upper Arm Region Mild depletion  Thoracic and Lumbar Region No depletion  Buccal Region Mild depletion  Temple Region Mild depletion  Clavicle Bone Region Mild depletion  Clavicle and Acromion Bone Region Mild depletion  Scapular Bone Region Mild depletion  Dorsal Hand No depletion  Patellar Region No depletion  Anterior Thigh Region No depletion  Posterior Calf Region No depletion  Edema (RD Assessment) Mild  Hair Reviewed  Eyes Reviewed  Mouth Reviewed  Skin Reviewed  Nails Reviewed       Diet Order:   Diet Order             Diet Carb Modified Fluid consistency: Thin; Room service appropriate? Yes  Diet effective now                   EDUCATION NEEDS:   Education needs have been addressed  Skin:  Skin Assessment: Reviewed RN Assessment  Last BM:  06/14/21  Height:   Ht Readings from Last 1 Encounters:  06/15/21 5\' 11"  (1.803 m)    Weight:   Wt Readings from  Last 1 Encounters:  06/15/21 80.4 kg    Ideal Body Weight:  78.2 kg  BMI:  Body mass index is 24.72 kg/m.  Estimated Nutritional Needs:   Kcal:  2000-2200  Protein:  105-120 grams  Fluid:  > 2 L    Loistine Chance, RD, LDN, [place] Registered Dietitian II Certified Diabetes Care and Education Specialist Please refer to [facility] for RD and/or RD on-call/weekend/after hours pager

## 2021-06-16 NOTE — Progress Notes (Signed)
Breo inhaler not available, Rx paged for missing dose.

## 2021-06-16 NOTE — Progress Notes (Signed)
PHARMACY - PHYSICIAN COMMUNICATION CRITICAL VALUE ALERT - BLOOD CULTURE IDENTIFICATION (BCID)  Trevor [name] is an 75 y.o. male who presented to [facility] on 06/15/2021 with a chief complaint of SOB/PNA  Assessment:   1/2 blood cultures growing Staphylococcus epidermidis--pt afebrile since admission and leukocytosis improving--likely contaminant  Name of physician (or Provider) Contacted:  Dr. Darrick Meigs  Current antibiotics:  Rocephin/Azithromycin  Changes to prescribed antibiotics recommended:  No changes at this time  Results for orders placed or performed during the hospital encounter of 06/15/21  Blood Culture ID Panel (Reflexed) (Collected: 06/15/2021  2:52 PM)  Result Value Ref Range   Enterococcus faecalis NOT DETECTED NOT DETECTED   Enterococcus Faecium NOT DETECTED NOT DETECTED   Listeria monocytogenes NOT DETECTED NOT DETECTED   Staphylococcus species DETECTED (A) NOT DETECTED   Staphylococcus aureus (BCID) NOT DETECTED NOT DETECTED   Staphylococcus epidermidis DETECTED (A) NOT DETECTED   Staphylococcus lugdunensis NOT DETECTED NOT DETECTED   Streptococcus species NOT DETECTED NOT DETECTED   Streptococcus agalactiae NOT DETECTED NOT DETECTED   Streptococcus pneumoniae NOT DETECTED NOT DETECTED   Streptococcus pyogenes NOT DETECTED NOT DETECTED   A.calcoaceticus-baumannii NOT DETECTED NOT DETECTED   Bacteroides fragilis NOT DETECTED NOT DETECTED   Enterobacterales NOT DETECTED NOT DETECTED   Enterobacter cloacae complex NOT DETECTED NOT DETECTED   Escherichia coli NOT DETECTED NOT DETECTED   Klebsiella aerogenes NOT DETECTED NOT DETECTED   Klebsiella oxytoca NOT DETECTED NOT DETECTED   Klebsiella pneumoniae NOT DETECTED NOT DETECTED   Proteus species NOT DETECTED NOT DETECTED   Salmonella species NOT DETECTED NOT DETECTED   Serratia marcescens NOT DETECTED NOT DETECTED   Haemophilus influenzae NOT DETECTED NOT DETECTED   Neisseria meningitidis NOT DETECTED NOT  DETECTED   Pseudomonas aeruginosa NOT DETECTED NOT DETECTED   Stenotrophomonas maltophilia NOT DETECTED NOT DETECTED   Candida albicans NOT DETECTED NOT DETECTED   Candida auris NOT DETECTED NOT DETECTED   Candida glabrata NOT DETECTED NOT DETECTED   Candida krusei NOT DETECTED NOT DETECTED   Candida parapsilosis NOT DETECTED NOT DETECTED   Candida tropicalis NOT DETECTED NOT DETECTED   Cryptococcus neoformans/gattii NOT DETECTED NOT DETECTED   Methicillin resistance mecA/C DETECTED (A) NOT DETECTED    Caryl Pina 06/16/2021  11:56 PM

## 2021-06-16 NOTE — Progress Notes (Signed)
Inpatient Diabetes Program Recommendations  AACE/ADA: New Consensus Statement on Inpatient Glycemic Control (2015)  Target Ranges:  Prepandial:   less than 140 mg/dL      Peak postprandial:   less than 180 mg/dL (1-2 hours)      Critically ill patients:  140 - 180 mg/dL   Lab Results  Component Value Date   GLUCAP 141 (H) 06/16/2021   HGBA1C 6.7 (H) 06/15/2021    Review of Glycemic Control Results for VINICIO, LYNK (MRN [id number]) as of 06/16/2021 09:03  Ref. Range 06/15/2021 20:26 06/16/2021 07:44  Glucose-Capillary Latest Ref Range: 70 - 99 mg/dL 239 (H) 141 (H)   Diabetes history: Type 2 DM Outpatient Diabetes medications: Metformin 500 mg TID, glipizide 10 mg BID Current orders for Inpatient glycemic control: Novolog 0-9 units TID  Inpatient Diabetes Program Recommendations:    Consider adding Novolog 0-5 units QHS and pending liver function question use of outpatient Metformin.   Thanks, Bronson Curb, MSN, RNC-OB Diabetes Coordinator [phone] (8a-5p)

## 2021-06-17 ENCOUNTER — Inpatient Hospital Stay (HOSPITAL_COMMUNITY): Payer: No Typology Code available for payment source

## 2021-06-17 LAB — CBC
HCT: 29.5 % — ABNORMAL LOW (ref 39.0–52.0)
Hemoglobin: 9 g/dL — ABNORMAL LOW (ref 13.0–17.0)
MCH: 23.9 pg — ABNORMAL LOW (ref 26.0–34.0)
MCHC: 30.5 g/dL (ref 30.0–36.0)
MCV: 78.5 fL — ABNORMAL LOW (ref 80.0–100.0)
Platelets: 460 10*3/uL — ABNORMAL HIGH (ref 150–400)
RBC: 3.76 MIL/uL — ABNORMAL LOW (ref 4.22–5.81)
RDW: 17.7 % — ABNORMAL HIGH (ref 11.5–15.5)
WBC: 11.8 10*3/uL — ABNORMAL HIGH (ref 4.0–10.5)
nRBC: 0.4 % — ABNORMAL HIGH (ref 0.0–0.2)

## 2021-06-17 LAB — RESPIRATORY PANEL BY PCR

## 2021-06-17 LAB — LEGIONELLA PNEUMOPHILA SEROGP 1 UR AG: L. pneumophila Serogp 1 Ur Ag: NEGATIVE

## 2021-06-17 LAB — GLUCOSE, CAPILLARY
Glucose-Capillary: 175 mg/dL — ABNORMAL HIGH (ref 70–99)
Glucose-Capillary: 205 mg/dL — ABNORMAL HIGH (ref 70–99)
Glucose-Capillary: 255 mg/dL — ABNORMAL HIGH (ref 70–99)

## 2021-06-17 LAB — HEPATITIS B SURFACE ANTIBODY, QUANTITATIVE: Hep B S AB Quant (Post): <3.1 m[IU]/mL — ABNORMAL LOW (ref >9.9)

## 2021-06-17 MED ORDER — GADOBUTROL 1 MMOL/ML IV SOLN
8.0000 mL | Freq: Once | INTRAVENOUS | Status: AC | PRN
  Administered 2021-06-17: 8 mL via INTRAVENOUS

## 2021-06-17 MED ORDER — DIPHENHYDRAMINE-ZINC ACETATE 2-0.1 % EX CREA
TOPICAL_CREAM | Freq: Two times a day (BID) | CUTANEOUS | Status: DC | PRN
  Filled 2021-06-17: qty 28

## 2021-06-17 NOTE — Progress Notes (Signed)
Subjective:  O/N: No events overnight [name] was seen at bedside this morning.  Patient reports sleeping well overnight.  States that he is still itchy today, but the moisturizer he is using helps alleviate the itch but is short-lived.  States that he is breathing better and that his cough is improving.  He denies any more episodes of loose stools.  Objective:  Vital signs in last 24 hours: Vitals:   06/16/21 2055 06/16/21 2055 06/17/21 0601 06/17/21 0629  BP: 133/61 133/61 (!) 111/50   Pulse: 87 87 72 72  Resp:  16 16 16   Temp: 98.4 F (36.9 C) 98.4 F (36.9 C) 98.1 F (36.7 C)   TempSrc:  Oral Oral   SpO2:  97% 97% 97%  Weight:      Height:       Physical Exam Constitutional:      General: He is not in acute distress.    Appearance: Normal appearance. He is not ill-appearing, toxic-appearing or diaphoretic.  Cardiovascular:     Rate and Rhythm: Normal rate and regular rhythm.     Pulses: Normal pulses.     Heart sounds: Normal heart sounds. No murmur heard.   No friction rub. No gallop.  Pulmonary:     Effort: Pulmonary effort is normal.     Breath sounds: Normal breath sounds. No wheezing.  Abdominal:     General: Abdomen is flat. Bowel sounds are normal.     Palpations: Abdomen is soft.  Neurological:     Mental Status: He is alert.     CBC Latest Ref Rng & Units 06/17/2021 06/16/2021 06/16/2021  WBC 4.0 - 10.5 K/uL 11.8(H) 11.1(H) 11.8(H)  Hemoglobin 13.0 - 17.0 g/dL 9.0(L) 9.0(L) 9.1(L)  Hematocrit 39.0 - 52.0 % 29.5(L) 30.9(L) 30.9(L)  Platelets 150 - 400 K/uL 460(H) 467(H) 450(H)    BMP Latest Ref Rng & Units 06/16/2021 06/15/2021 12/21/2017  Glucose 70 - 99 mg/dL 202(H) 220(H) 109(H)  BUN 8 - 23 mg/dL 15 22 11   Creatinine 0.61 - 1.24 mg/dL 1.06 1.32(H) 1.02  BUN/Creat Ratio 6 - 22 (calc) - - -  Sodium 135 - 145 mmol/L 140 135 137  Potassium 3.5 - 5.1 mmol/L 4.0 5.0 4.5  Chloride 98 - 111 mmol/L 108 101 102  CO2 22 - 32 mmol/L 22 22 25   Calcium 8.9 - 10.3  mg/dL 9.0 9.4 9.8     Assessment/Plan:  Principal Problem:   Pneumonia Active Problems:   Acute kidney injury (HCC)   Normocytic anemia   Right atrial mass   Liver mass   Malnutrition of moderate degree  Community Acquired Pneumonia Patient continues to breathe comfortably on room air.  Reports objective improvement in cough. Leukocytosis stable 11.8.  Strep pneumo result came back negative.  - Ceftriaxone 1 mg (day 3/5) - Azithromycin to 250 mg oral (day 3/5). - Legionella antigen pending - Respiratory Pathogen panel   Hepatic Mass, Cardiac Mass, Diarrhea; Concern for Malignancy Unable to obtain MRI yesterday, patient still scheduled for today.  TTE showed ejection fraction of 60 to 65%, and a large mass in the right atrium.  Further recommendation of obtaining a TEE recommended.  - Multiphasic contrast enhanced MRI   Hypoproliferative Microcytic Anemia  Hgb 9.0 and MCV 79.8 today. Reticulocyte count is 2% with index of 1.4 indicative of hypoproliferative anemia.  Iron studies show ferritin of 41, iron level of 22, TIBC of 405, and saturation ratios of 5 is concerning for iron deficiency anemia.  Patient had recent EGD and colonoscopy that were reported normal.  Currently working up a hepatic mass lesion and cardiac mass, which concerning for malignancy.  Will defer IV Feraheme, given the fact that patient's going for MRI later today. - Give supplemental IV iron after liver with without contrast.  AKI (Resolved)  Prior to Admission Living Arrangement: Home Anticipated Discharge Location: Home Barriers to Discharge: Continued medical work-up Dispo: Anticipated discharge in approximately 2 to 3 days.   Maudie Mercury, MD 06/17/2021, 6:50 AM Pager: [phone] After 5pm on weekdays and 1pm on weekends: On Call pager [phone]

## 2021-06-18 DIAGNOSIS — R160 Hepatomegaly, not elsewhere classified: Secondary | ICD-10-CM

## 2021-06-18 DIAGNOSIS — I5189 Other ill-defined heart diseases: Secondary | ICD-10-CM

## 2021-06-18 LAB — CBC
HCT: 30.1 % — ABNORMAL LOW (ref 39.0–52.0)
Hemoglobin: 9.2 g/dL — ABNORMAL LOW (ref 13.0–17.0)
MCH: 24 pg — ABNORMAL LOW (ref 26.0–34.0)
MCHC: 30.6 g/dL (ref 30.0–36.0)
MCV: 78.4 fL — ABNORMAL LOW (ref 80.0–100.0)
Platelets: 447 10*3/uL — ABNORMAL HIGH (ref 150–400)
RBC: 3.84 MIL/uL — ABNORMAL LOW (ref 4.22–5.81)
RDW: 17.9 % — ABNORMAL HIGH (ref 11.5–15.5)
WBC: 11.8 10*3/uL — ABNORMAL HIGH (ref 4.0–10.5)
nRBC: 0.4 % — ABNORMAL HIGH (ref 0.0–0.2)

## 2021-06-18 LAB — GLUCOSE, CAPILLARY
Glucose-Capillary: 145 mg/dL — ABNORMAL HIGH (ref 70–99)
Glucose-Capillary: 143 mg/dL — ABNORMAL HIGH (ref 70–99)
Glucose-Capillary: 170 mg/dL — ABNORMAL HIGH (ref 70–99)
Glucose-Capillary: 115 mg/dL — ABNORMAL HIGH (ref 70–99)

## 2021-06-18 LAB — BASIC METABOLIC PANEL
Anion gap: 12 (ref 5–15)
BUN: 9 mg/dL (ref 8–23)
CO2: 21 mmol/L — ABNORMAL LOW (ref 22–32)
Calcium: 8.8 mg/dL — ABNORMAL LOW (ref 8.9–10.3)
Chloride: 106 mmol/L (ref 98–111)
Creatinine, Ser: 0.87 mg/dL (ref 0.61–1.24)
GFR, Estimated: >60 mL/min (ref >60)
Glucose, Bld: 137 mg/dL — ABNORMAL HIGH (ref 70–99)
Potassium: 3.8 mmol/L (ref 3.5–5.1)
Sodium: 139 mmol/L (ref 135–145)

## 2021-06-18 LAB — CULTURE, BLOOD (ROUTINE X 2)

## 2021-06-18 LAB — LACTATE DEHYDROGENASE: LDH: 453 U/L — ABNORMAL HIGH (ref 98–192)

## 2021-06-18 MED ORDER — SODIUM CHLORIDE 0.9 % IV SOLN
510.0000 mg | Freq: Once | INTRAVENOUS | Status: AC
  Administered 2021-06-18: 510 mg via INTRAVENOUS
  Filled 2021-06-18: qty 17

## 2021-06-18 MED ORDER — POLYVINYL ALCOHOL 1.4 % OP SOLN
1.0000 [drp] | OPHTHALMIC | Status: DC | PRN
  Administered 2021-06-19: 1 [drp] via OPHTHALMIC
  Filled 2021-06-18: qty 15

## 2021-06-18 NOTE — Plan of Care (Signed)
  Problem: Clinical Measurements: Goal: Respiratory complications will improve Outcome: Progressing   Problem: Clinical Measurements: Goal: Cardiovascular complication will be avoided Outcome: Progressing   Problem: Skin Integrity: Goal: Risk for impaired skin integrity will decrease Outcome: Progressing

## 2021-06-18 NOTE — Progress Notes (Signed)
Subjective:  O/N: No events overnight Trevor [name] was seen at bedside morning. He reports that his cough and breathing is much improved from admission. He had 1-2 bowel movements yesterday but no diarrhea.He is very hungry.   Objective:  Vital signs in last 24 hours: Vitals:   06/17/21 0705 06/17/21 2101 06/18/21 0502 06/18/21 0743  BP:  (!) 150/69 (!) 135/59   Pulse:  86 91 89  Resp:  16 16 19   Temp:  98.1 F (36.7 C) 98.4 F (36.9 C)   TempSrc:  Oral Oral   SpO2: 98% 98% 96% 95%  Weight:      Height:          CBC Latest Ref Rng & Units 06/18/2021 06/17/2021 06/16/2021  WBC 4.0 - 10.5 K/uL 11.8(H) 11.8(H) 11.1(H)  Hemoglobin 13.0 - 17.0 g/dL 9.2(L) 9.0(L) 9.0(L)  Hematocrit 39.0 - 52.0 % 30.1(L) 29.5(L) 30.9(L)  Platelets 150 - 400 K/uL 447(H) 460(H) 467(H)    BMP Latest Ref Rng & Units 06/18/2021 06/16/2021 06/15/2021  Glucose 70 - 99 mg/dL 137(H) 202(H) 220(H)  BUN 8 - 23 mg/dL 9 15 22   Creatinine 0.61 - 1.24 mg/dL 0.87 1.06 1.32(H)  BUN/Creat Ratio 6 - 22 (calc) - - -  Sodium 135 - 145 mmol/L 139 140 135  Potassium 3.5 - 5.1 mmol/L 3.8 4.0 5.0  Chloride 98 - 111 mmol/L 106 108 101  CO2 22 - 32 mmol/L 21(L) 22 22  Calcium 8.9 - 10.3 mg/dL 8.8(L) 9.0 9.4   Constitutional: kind, elderly male resting comfortably in bed, in no acute distress HENT: normocephalic atraumatic Eyes: conjunctiva non-erythematous, no scleral icterus Cardiovascular: regular rate and rhythm, no extra heart sounds, pitting edema present on feet R>L Pulmonary/Chest: normal work of breathing on room air, upper and lower lung fields CTAB, markedly improved from admission Abdominal: soft, non-tender, non-distended, normal bowel sounds MSK: No gross deficits Neurological: alert & oriented x 3, no focal deficits Skin: warm and dry, telangiectasias present diffusely on lower extremities Psych: cheerful affect, appropriate  Assessment/Plan:  Principal Problem:   Pneumonia Active Problems:   Acute kidney  injury (HCC)   Normocytic anemia   Right atrial mass   Liver mass   Malnutrition of moderate degree  Community Acquired Pneumonia Cough and congestion markedly improved on antibiotics treatment. Lung exam markedly improved from admission and he is breathing comfortably on room air. WBC 11.8, unchanged from yesterday. No new fevers. Legionella antigen and respiratory pathogen panel negative. - Ceftriaxone 1 mg (day 4/5) - Azithromycin to 250 mg oral (day 4/5).   Hepatic Mass, Cardiac Mass, Diarrhea; Concern for Malignancy MRI performed yesterday remarkable for a large hyperenhancing hepatic mass 14.4 x 9.4 x 9.4 cm and multiple satellite lesions in right hepatic lobe. Given his history of unintentional weight loss and microcytic anemia, highly concerned for malignancy such as hepatocellular carcinoma and cholangiocarcinoma or metastatic disease. Consulted IR today, planning for liver biopsy on 06/20/21. The same MRI also demonstrated a 4.4 cm filling defect in the RA, which corroborates concern for cardiac mass raised by TTE done 06/16/21. Consulted Cardiology today; will plan for TEE on 06/20/21. - f/u cancer markers: AFP, CEA, LDH, CA 19-9 - IR Liver Biopsy potentially 06/20/21 - Cardiology consulted; plan for TEE 06/20/21   Iron Deficiency Anemia  Hgb 9.2 and MCV 78.4 today stable from prior. Reticulocyte count is 2% with index of 1.4 indicative of hypoproliferative anemia. Likely  Ferritin of 41, iron level of 22, TIBC of 405,  and saturation ratios of 5 indicating iron deficiency anemia of unknown etiology.  Unlikely to be due to occult bleed given that recent EGD and colonoscopy were reportedly normal. Could be secondary to malignancy given that patient has a known Liver mass and potential cardiac mass in the setting of unintentional weight loss.  - IV Ferraheme today - continue malignancy workup as mentioned above  AKI (Resolved)  Prior to Admission Living Arrangement: Home Anticipated Discharge  Location: Home Barriers to Discharge: Continued medical work-up Dispo: Anticipated discharge in approximately 2 to 3 days.   Trevor [name], Medical Student 06/18/2021, 11:47 AM Pager: [phone] After 5pm on weekdays and 1pm on weekends: On Call pager [phone]

## 2021-06-18 NOTE — Progress Notes (Signed)
   IR aware of Liver bx request In review now with IR Radiologist  If approved- Bx will be performed no earlier that 7/5 Secondary weekend and Holiday- PA will see pt accordingly

## 2021-06-18 NOTE — Consult Note (Signed)
Reason for Consult For transesophageal echocardiogram Referring Physician: Teaching service  Trevor [name] is an 75 y.o. male.  HPI: Patient is 75 year old male with past medical history significant for hypertension, hyperlipidemia, diabetes mellitus, COPD, GERD, 50 pack years of tobacco abuse quit approximately 10 years ago, history of EtOH abuse 6 to 10 cans of beer on weekends only for 6 -7 years quit many years ago was admitted on 06/15/2021 because of coughing associated with shortness of breath and was noted to have patchy bilateral pneumonia.  Cardiology consultation was called as patient was noted to have mass in the right atrium by transthoracic 2D echo for transesophageal echo.  Patient also was noted to have large liver mass associated with mild splenomegaly ascites and borderline enlarged upper abdominal lymph nodes.  Patient denies any chest pain or shortness of breath denies palpitation lightheadedness or syncope.  Denies PND orthopnea leg swelling.  States his appetite has been poor for last few months and has lost approximately 37 pounds in last 3 to 4 months.   Past Medical History:  Diagnosis Date   BPH (benign prostatic hyperplasia)    COPD (chronic obstructive pulmonary disease) (HCC)    COPD (chronic obstructive pulmonary disease) (HCC)    COVID-19    Diabetes mellitus without complication (HCC)    GERD (gastroesophageal reflux disease)    Hypercholesterolemia    Hypertension    Seizures (HCC)    Type II or unspecified type diabetes mellitus without mention of complication, not stated as uncontrolled     Past Surgical History:  Procedure Laterality Date   HAND SURGERY Left 1953   KNEE ARTHROSCOPY Left 1998    Family History  Problem Relation Age of Onset   CVA Mother    Thyroid disease Mother    Heart disease Father    Diabetes Sister    Heart disease Sister    Diabetes Brother    Heart disease Brother    Epilepsy Brother    Schizophrenia Brother     Social  History:  reports that he quit smoking about 7 years ago. He has never used smokeless tobacco. He reports that he does not drink alcohol and does not use drugs.  Allergies:  Allergies  Allergen Reactions   Atorvastatin Other (See Comments)    Muscle pain   Latex Hives and Rash   Penicillin G Rash   Penicillins Rash    Medications: I have reviewed the patient's current medications.  Results for orders placed or performed during the hospital encounter of 06/15/21 (from the past 48 hour(s))  Glucose, capillary     Status: Abnormal   Collection Time: 06/16/21  6:15 PM  Result Value Ref Range   Glucose-Capillary 188 (H) 70 - 99 mg/dL    Comment: Glucose reference range applies only to samples taken after fasting for at least 8 hours.  Glucose, capillary     Status: Abnormal   Collection Time: 06/16/21  8:53 PM  Result Value Ref Range   Glucose-Capillary 173 (H) 70 - 99 mg/dL    Comment: Glucose reference range applies only to samples taken after fasting for at least 8 hours.  CBC     Status: Abnormal   Collection Time: 06/17/21  2:49 AM  Result Value Ref Range   WBC 11.8 (H) 4.0 - 10.5 K/uL   RBC 3.76 (L) 4.22 - 5.81 MIL/uL   Hemoglobin 9.0 (L) 13.0 - 17.0 g/dL   HCT 29.5 (L) 39.0 - 52.0 %   MCV  78.5 (L) 80.0 - 100.0 fL   MCH 23.9 (L) 26.0 - 34.0 pg   MCHC 30.5 30.0 - 36.0 g/dL   RDW 17.7 (H) 11.5 - 15.5 %   Platelets 460 (H) 150 - 400 K/uL   nRBC 0.4 (H) 0.0 - 0.2 %    Comment: Performed at [facility] [address], [place], [place] [address]  Glucose, capillary     Status: Abnormal   Collection Time: 06/17/21  8:49 AM  Result Value Ref Range   Glucose-Capillary 175 (H) 70 - 99 mg/dL    Comment: Glucose reference range applies only to samples taken after fasting for at least 8 hours.  Glucose, capillary     Status: Abnormal   Collection Time: 06/17/21 11:59 AM  Result Value Ref Range   Glucose-Capillary 205 (H) 70 - 99 mg/dL    Comment: Glucose reference range  applies only to samples taken after fasting for at least 8 hours.  Respiratory (~20 pathogens) panel by PCR     Status: None   Collection Time: 06/17/21 12:02 PM   Specimen: Nasopharyngeal Swab; Respiratory  Result Value Ref Range   Adenovirus NOT DETECTED NOT DETECTED   Coronavirus 229E NOT DETECTED NOT DETECTED    Comment: (NOTE) The Coronavirus on the Respiratory Panel, DOES NOT test for the novel  Coronavirus (2019 nCoV)    Coronavirus HKU1 NOT DETECTED NOT DETECTED   Coronavirus NL63 NOT DETECTED NOT DETECTED   Coronavirus OC43 NOT DETECTED NOT DETECTED   Metapneumovirus NOT DETECTED NOT DETECTED   Rhinovirus / Enterovirus NOT DETECTED NOT DETECTED   Influenza A NOT DETECTED NOT DETECTED   Influenza B NOT DETECTED NOT DETECTED   Parainfluenza Virus 1 NOT DETECTED NOT DETECTED   Parainfluenza Virus 2 NOT DETECTED NOT DETECTED   Parainfluenza Virus 3 NOT DETECTED NOT DETECTED   Parainfluenza Virus 4 NOT DETECTED NOT DETECTED   Respiratory Syncytial Virus NOT DETECTED NOT DETECTED   Bordetella pertussis NOT DETECTED NOT DETECTED   Bordetella Parapertussis NOT DETECTED NOT DETECTED   Chlamydophila pneumoniae NOT DETECTED NOT DETECTED   Mycoplasma pneumoniae NOT DETECTED NOT DETECTED    Comment: Performed at [facility], [place] [address] [place], [place] [address]  Glucose, capillary     Status: Abnormal   Collection Time: 06/17/21  8:57 PM  Result Value Ref Range   Glucose-Capillary 255 (H) 70 - 99 mg/dL    Comment: Glucose reference range applies only to samples taken after fasting for at least 8 hours.  CBC     Status: Abnormal   Collection Time: 06/18/21  3:23 AM  Result Value Ref Range   WBC 11.8 (H) 4.0 - 10.5 K/uL   RBC 3.84 (L) 4.22 - 5.81 MIL/uL   Hemoglobin 9.2 (L) 13.0 - 17.0 g/dL   HCT 30.1 (L) 39.0 - 52.0 %   MCV 78.4 (L) 80.0 - 100.0 fL   MCH 24.0 (L) 26.0 - 34.0 pg   MCHC 30.6 30.0 - 36.0 g/dL   RDW 17.9 (H) 11.5 - 15.5 %   Platelets 447 (H) 150 - 400  K/uL   nRBC 0.4 (H) 0.0 - 0.2 %    Comment: Performed at [facility] [address], [place], [place] [address]  Basic metabolic panel     Status: Abnormal   Collection Time: 06/18/21  3:23 AM  Result Value Ref Range   Sodium 139 135 - 145 mmol/L   Potassium 3.8 3.5 -  5.1 mmol/L   Chloride 106 98 - 111 mmol/L   CO2 21 (L) 22 - 32 mmol/L   Glucose, Bld 137 (H) 70 - 99 mg/dL    Comment: Glucose reference range applies only to samples taken after fasting for at least 8 hours.   BUN 9 8 - 23 mg/dL   Creatinine, Ser 0.87 0.61 - 1.24 mg/dL   Calcium 8.8 (L) 8.9 - 10.3 mg/dL   GFR, Estimated >60 >60 mL/min    Comment: (NOTE) Calculated using the CKD-EPI Creatinine Equation (2021)    Anion gap 12 5 - 15    Comment: Performed at [facility] [address] [place], [place] [address]  Glucose, capillary     Status: Abnormal   Collection Time: 06/18/21  8:15 AM  Result Value Ref Range   Glucose-Capillary 145 (H) 70 - 99 mg/dL    Comment: Glucose reference range applies only to samples taken after fasting for at least 8 hours.  Glucose, capillary     Status: Abnormal   Collection Time: 06/18/21 11:45 AM  Result Value Ref Range   Glucose-Capillary 143 (H) 70 - 99 mg/dL    Comment: Glucose reference range applies only to samples taken after fasting for at least 8 hours.    MR LIVER W WO CONTRAST  Result Date: 06/17/2021 CLINICAL DATA:  Evaluate liver mass. EXAM: MRI ABDOMEN WITHOUT AND WITH CONTRAST TECHNIQUE: Multiplanar multisequence MR imaging of the abdomen was performed both before and after the administration of intravenous contrast. CONTRAST:  36mL GADAVIST GADOBUTROL 1 MMOL/ML IV SOLN COMPARISON:  06/15/2021 FINDINGS: Lower chest: Trace bilateral pleural effusions. Filling defect within the right atrium is identified measuring approximately 4.4 cm. The intrahepatic IVC appears patent. Hepatobiliary: On the arterial phase images there is a large hyperenhancing mass involving  segment 7, 8, 4 a, and 4B. This measures 14.4 by 9.4 by 9.4 cm. The mass has heterogeneous arterial phase enhancement. On the delayed images there is washout of contrast from within this mass. Several additional, smaller satellite lesions are identified which also exhibit arterial phase enhancement including a 1.3 cm lesion in the periphery of the right hepatic lobe, image 59/1001. The gallbladder is collapsed and contains a 6 mm stone. Mild edema the gallbladder wall. No signs of bile duct dilatation. Pancreas: No mass, inflammatory changes, or other parenchymal abnormality identified. Spleen: The spleen measures 14.4 by 9.8 by 5.8 cm (volume = 430 cm^3). No focal splenic abnormality. Adrenals/Urinary Tract: Normal appearance of the adrenal glands. No kidney mass or hydronephrosis. Stomach/Bowel: Visualized portions within the abdomen are unremarkable. Vascular/Lymphatic: Normal caliber of the abdominal aorta.  Portal vein appears patent. Porta hepatics lymph node is borderline enlarged measuring 1 cm, image 21/. Gastrohepatic ligament nodes measure 1.2 cm, image 18/6. Portacaval lymph node measures 1 cm, image 22/6 Other:  Small volume of ascites identified. Musculoskeletal: No suspicious bone lesions identified. IMPRESSION: 1. Large arterial phase enhancing mass involving segment 7, 8, 4A and 4B, which shows washout on delayed images. In a patient with cirrhosis or viral hepatitis findings are highly concerning for infiltrative hepatocellular carcinoma. In the absence risk factors for hepatocellular carcinoma other potential diagnostic considerations include intrahepatic cholangiocarcinoma or less likely metastatic disease. Clinical correlation including assessment alpha fetoprotein levels advised. Several additional smaller satellite lesions identified which also exhibit arterial phase enhancement inter suspicious for malignancy. 2. Right atrial mass, indeterminate. 3. Mild splenomegaly. 4. Trace bilateral  pleural effusions and small volume of ascites. 5. Borderline enlarged  upper abdominal lymph nodes are identified. Cannot exclude metastatic. Electronically Signed   By: Kerby Moors M.D.   On: 06/17/2021 18:58    Review of Systems  Constitutional:  Positive for appetite change and unexpected weight change.  HENT:  Positive for congestion.   Eyes:  Negative for visual disturbance.  Respiratory:  Positive for cough and shortness of breath.   Cardiovascular:  Negative for chest pain, palpitations and leg swelling.  Gastrointestinal:  Negative for abdominal pain.  Genitourinary:  Negative for difficulty urinating.  Neurological:  Negative for dizziness and syncope.  Blood pressure (!) 135/59, pulse 89, temperature 98.4 F (36.9 C), temperature source Oral, resp. rate 19, height 5\' 11"  (1.803 m), weight 80.4 kg, SpO2 95 %. Physical Exam Constitutional:      Appearance: Normal appearance.  HENT:     Head: Normocephalic and atraumatic.  Eyes:     Extraocular Movements: Extraocular movements intact.     Pupils: Pupils are equal, round, and reactive to light.  Neck:     Vascular: No carotid bruit.  Cardiovascular:     Rate and Rhythm: Normal rate and regular rhythm.     Heart sounds: Murmur (2/6 systolic murmur noted) heard.  Pulmonary:     Effort: Pulmonary effort is normal.     Breath sounds: Normal breath sounds.  Abdominal:     General: There is distension.     Palpations: Abdomen is soft.     Tenderness: There is no abdominal tenderness. There is no guarding.  Musculoskeletal:        General: No swelling or tenderness.     Cervical back: Normal range of motion and neck supple.     Right lower leg: No edema.     Left lower leg: No edema.  Skin:    General: Skin is warm and dry.  Neurological:     General: No focal deficit present.     Mental Status: He is alert and oriented to person, place, and time.    Assessment/Plan: Right atrial mass probably atrial myxoma versus  questionable mets Hepatic mass work-up in progress Hypertension Diabetes mellitus Hypercholesterolemia COPD Resolving bilateral pneumonia Tobacco abuse History of EtOH abuse GERD Seizure disorder Plan Continue present management as per primary team Will schedule for TEE on Tuesday.    Charolette Forward 06/18/2021, 1:40 PM

## 2021-06-19 ENCOUNTER — Inpatient Hospital Stay (HOSPITAL_COMMUNITY): Payer: No Typology Code available for payment source

## 2021-06-19 LAB — BASIC METABOLIC PANEL
Anion gap: 8 (ref 5–15)
BUN: 11 mg/dL (ref 8–23)
CO2: 20 mmol/L — ABNORMAL LOW (ref 22–32)
Calcium: 8.9 mg/dL (ref 8.9–10.3)
Chloride: 109 mmol/L (ref 98–111)
Creatinine, Ser: 0.94 mg/dL (ref 0.61–1.24)
GFR, Estimated: >60 mL/min (ref >60)
Glucose, Bld: 93 mg/dL (ref 70–99)
Potassium: 3.9 mmol/L (ref 3.5–5.1)
Sodium: 137 mmol/L (ref 135–145)

## 2021-06-19 LAB — CBC
HCT: 30.9 % — ABNORMAL LOW (ref 39.0–52.0)
Hemoglobin: 9.4 g/dL — ABNORMAL LOW (ref 13.0–17.0)
MCH: 24.2 pg — ABNORMAL LOW (ref 26.0–34.0)
MCHC: 30.4 g/dL (ref 30.0–36.0)
MCV: 79.4 fL — ABNORMAL LOW (ref 80.0–100.0)
Platelets: 421 10*3/uL — ABNORMAL HIGH (ref 150–400)
RBC: 3.89 MIL/uL — ABNORMAL LOW (ref 4.22–5.81)
RDW: 18.4 % — ABNORMAL HIGH (ref 11.5–15.5)
WBC: 11.6 10*3/uL — ABNORMAL HIGH (ref 4.0–10.5)
nRBC: 0.5 % — ABNORMAL HIGH (ref 0.0–0.2)

## 2021-06-19 LAB — GLUCOSE, CAPILLARY
Glucose-Capillary: 126 mg/dL — ABNORMAL HIGH (ref 70–99)
Glucose-Capillary: 123 mg/dL — ABNORMAL HIGH (ref 70–99)
Glucose-Capillary: 111 mg/dL — ABNORMAL HIGH (ref 70–99)

## 2021-06-19 MED ORDER — ENOXAPARIN SODIUM 40 MG/0.4ML IJ SOSY
40.0000 mg | PREFILLED_SYRINGE | INTRAMUSCULAR | Status: DC
  Administered 2021-06-21 – 2021-06-22 (×2): 40 mg via SUBCUTANEOUS
  Filled 2021-06-19 (×2): qty 0.4

## 2021-06-19 MED ORDER — IOHEXOL 300 MG/ML  SOLN
75.0000 mL | Freq: Once | INTRAMUSCULAR | Status: AC | PRN
  Administered 2021-06-19: 75 mL via INTRAVENOUS

## 2021-06-19 MED ORDER — CALCIUM CARBONATE ANTACID 500 MG PO CHEW
1.0000 | CHEWABLE_TABLET | Freq: Once | ORAL | Status: AC
  Administered 2021-06-19: 200 mg via ORAL
  Filled 2021-06-19: qty 1

## 2021-06-19 NOTE — Progress Notes (Signed)
Subjective:  O/N Events: None  Mr. Corrie Mckusick was seen during morning rounds.  He states that his he is doing well, his cough is improved.  He is able to get up and ambulate towards the bathroom use the bathroom independently and come back to bed.  He states that he feels close to baseline today.  Denies any nausea, vomiting, or fevers.  Objective:  Vital signs in last 24 hours: Vitals:   06/18/21 0743 06/18/21 1649 06/18/21 2114 06/19/21 0437  BP:  (!) 155/73 (!) 144/70 (!) 148/57  Pulse: 89 87 80 72  Resp: 19 18 16 16   Temp:  97.6 F (36.4 C) 98.4 F (36.9 C) 98.7 F (37.1 C)  TempSrc:   Oral Oral  SpO2: 95% 99% 97% 97%  Weight:      Height:       Physical Exam Constitutional:      General: He is not in acute distress.    Appearance: He is not ill-appearing or toxic-appearing.  Cardiovascular:     Rate and Rhythm: Normal rate and regular rhythm.     Pulses: Normal pulses.     Heart sounds: Normal heart sounds. No murmur heard.   No friction rub. No gallop.  Pulmonary:     Effort: Pulmonary effort is normal.     Breath sounds: Normal breath sounds. No wheezing, rhonchi or rales.  Abdominal:     General: Abdomen is flat. Bowel sounds are normal.     Palpations: Abdomen is soft.     Tenderness: There is no abdominal tenderness.  Neurological:     General: No focal deficit present.     Mental Status: He is alert and oriented to person, place, and time.  Psychiatric:        Mood and Affect: Mood normal.        Behavior: Behavior normal.     CBC Latest Ref Rng & Units 06/19/2021 06/18/2021 06/17/2021  WBC 4.0 - 10.5 K/uL 11.6(H) 11.8(H) 11.8(H)  Hemoglobin 13.0 - 17.0 g/dL 9.4(L) 9.2(L) 9.0(L)  Hematocrit 39.0 - 52.0 % 30.9(L) 30.1(L) 29.5(L)  Platelets 150 - 400 K/uL 421(H) 447(H) 460(H)    BMP Latest Ref Rng & Units 06/19/2021 06/18/2021 06/16/2021  Glucose 70 - 99 mg/dL 93 137(H) 202(H)  BUN 8 - 23 mg/dL 11 9 15   Creatinine 0.61 - 1.24 mg/dL 0.94 0.87 1.06  BUN/Creat Ratio  6 - 22 (calc) - - -  Sodium 135 - 145 mmol/L 137 139 140  Potassium 3.5 - 5.1 mmol/L 3.9 3.8 4.0  Chloride 98 - 111 mmol/L 109 106 108  CO2 22 - 32 mmol/L 20(L) 21(L) 22  Calcium 8.9 - 10.3 mg/dL 8.9 8.8(L) 9.0      Assessment/Plan:  Principal Problem:   Pneumonia Active Problems:   Acute kidney injury (HCC)   Normocytic anemia   Right atrial mass   Liver mass   Malnutrition of moderate degree   Mr. Corrie Mckusick is a 75 year old gentleman with past medical history of hypertension, diabetes, BPH, and COPD who was admitted for community-acquired pneumonia, and is currently being worked up for a liver mass.  Community Acquired Pneumonia WBC 11.6, No new fevers.  Completed 5-day course of antibiotic with much improved symptoms.   Hepatic Mass, Cardiac Mass, Diarrhea; Concern for Malignancy MRI performed yesterday remarkable for a large hyperenhancing hepatic mass 14.4 x 9.4 x 9.4 cm and multiple satellite lesions in right hepatic lobe. Given his history of unintentional weight loss and microcytic anemia,  highly concerned for malignancy such as hepatocellular carcinoma and cholangiocarcinoma or metastatic disease. Consulted Cardiology today; will plan for TEE on 06/20/21. LDH increased to 453.  Will order chest CT today to complete cancer work-up. -Diet n.p.o. at midnight - f/u cancer markers: AFP, CEA, CA 19-9 - IR Liver Biopsy potentially 06/20/21 - TEE 06/20/21   Iron Deficiency Anemia  Hgb 9.4 and MCV 79.4 today stable from prior. Received Marisue [place] 7/3.  - continue malignancy workup as mentioned above   Prior to Admission Living Arrangement: Home Anticipated Discharge Location: Home Barriers to Discharge: Continue medical work up Dispo: Anticipated discharge in approximately 2-3 day(s).    Maudie Mercury, MD 06/19/2021, 6:13 AM Pager: [phone] After 5pm on weekdays and 1pm on weekends: On Call pager [phone]

## 2021-06-19 NOTE — Consult Note (Signed)
Subjective:  Patient denies any chest pain or shortness of breath.  Discussed with patient regarding TEE and agrees to proceed with the procedure.  Patient also scheduled for liver biopsy tomorrow Objective:  Vital Signs in the last 24 hours: Temp:  [97.6 F (36.4 C)-98.7 F (37.1 C)] 98.7 F (37.1 C) (07/04 0750) Pulse Rate:  [72-99] 94 (07/04 0750) Resp:  [16-18] 16 (07/04 0750) BP: (133-155)/(57-73) 133/63 (07/04 0750) SpO2:  [95 %-99 %] 97 % (07/04 0750)  Intake/Output from previous day: 07/03 0701 - 07/04 0700 In: 500.1 [IV Piggyback:500.1] Out: 675 [Urine:675] Intake/Output from this shift: No intake/output data recorded.  Physical Exam: Exam unchanged  Lab Results: Recent Labs    06/18/21 0323 06/19/21 0345  WBC 11.8* 11.6*  HGB 9.2* 9.4*  PLT 447* 421*   Recent Labs    06/18/21 0323 06/19/21 0345  NA 139 137  K 3.8 3.9  CL 106 109  CO2 21* 20*  GLUCOSE 137* 93  BUN 9 11  CREATININE 0.87 0.94   No results for input(s): TROPONINI in the last 72 hours.  Invalid input(s): CK, MB Hepatic Function Panel No results for input(s): PROT, ALBUMIN, AST, ALT, ALKPHOS, BILITOT, BILIDIR, IBILI in the last 72 hours. No results for input(s): CHOL in the last 72 hours. No results for input(s): PROTIME in the last 72 hours.  Imaging: MR LIVER W WO CONTRAST  Result Date: 06/17/2021 CLINICAL DATA:  Evaluate liver mass. EXAM: MRI ABDOMEN WITHOUT AND WITH CONTRAST TECHNIQUE: Multiplanar multisequence MR imaging of the abdomen was performed both before and after the administration of intravenous contrast. CONTRAST:  71mL GADAVIST GADOBUTROL 1 MMOL/ML IV SOLN COMPARISON:  06/15/2021 FINDINGS: Lower chest: Trace bilateral pleural effusions. Filling defect within the right atrium is identified measuring approximately 4.4 cm. The intrahepatic IVC appears patent. Hepatobiliary: On the arterial phase images there is a large hyperenhancing mass involving segment 7, 8, 4 a, and 4B. This  measures 14.4 by 9.4 by 9.4 cm. The mass has heterogeneous arterial phase enhancement. On the delayed images there is washout of contrast from within this mass. Several additional, smaller satellite lesions are identified which also exhibit arterial phase enhancement including a 1.3 cm lesion in the periphery of the right hepatic lobe, image 59/1001. The gallbladder is collapsed and contains a 6 mm stone. Mild edema the gallbladder wall. No signs of bile duct dilatation. Pancreas: No mass, inflammatory changes, or other parenchymal abnormality identified. Spleen: The spleen measures 14.4 by 9.8 by 5.8 cm (volume = 430 cm^3). No focal splenic abnormality. Adrenals/Urinary Tract: Normal appearance of the adrenal glands. No kidney mass or hydronephrosis. Stomach/Bowel: Visualized portions within the abdomen are unremarkable. Vascular/Lymphatic: Normal caliber of the abdominal aorta.  Portal vein appears patent. Porta hepatics lymph node is borderline enlarged measuring 1 cm, image 21/. Gastrohepatic ligament nodes measure 1.2 cm, image 18/6. Portacaval lymph node measures 1 cm, image 22/6 Other:  Small volume of ascites identified. Musculoskeletal: No suspicious bone lesions identified. IMPRESSION: 1. Large arterial phase enhancing mass involving segment 7, 8, 4A and 4B, which shows washout on delayed images. In a patient with cirrhosis or viral hepatitis findings are highly concerning for infiltrative hepatocellular carcinoma. In the absence risk factors for hepatocellular carcinoma other potential diagnostic considerations include intrahepatic cholangiocarcinoma or less likely metastatic disease. Clinical correlation including assessment alpha fetoprotein levels advised. Several additional smaller satellite lesions identified which also exhibit arterial phase enhancement inter suspicious for malignancy. 2. Right atrial mass, indeterminate. 3. Mild splenomegaly.  4. Trace bilateral pleural effusions and small volume of  ascites. 5. Borderline enlarged upper abdominal lymph nodes are identified. Cannot exclude metastatic. Electronically Signed   By: Kerby Moors M.D.   On: 06/17/2021 18:58    Cardiac Studies:  Assessment/Plan:  Right atrial mass probably atrial myxoma versus questionable mets Hepatic mass work-up in progress Hypertension Diabetes mellitus Hypercholesterolemia COPD Resolving bilateral pneumonia Tobacco abuse History of EtOH abuse GERD Seizure disorder Plan Will schedule for TEE for tomorrow Dr. Doylene Canard to perform TEE  LOS: 3 days    Charolette Forward 06/19/2021, 8:48 AM

## 2021-06-19 NOTE — Consult Note (Signed)
Chief Complaint: Patient was seen in consultation today for liver lesion biopsy Chief Complaint  Patient presents with   Diarrhea   Cough   at the request of Dr Thomes Dinning   Supervising Physician: Jacqulynn Cadet  Patient Status: [facility] - In-pt  History of Present Illness: Trevor [name] is a 75 y.o. male   COPD; DM; HTN Adm 06/15/21 with Pneumonia; Nausea; diarrhea Hx Covid 12/21  Work up in ED included imaging  MR 7/2:  IMPRESSION: 1. Large arterial phase enhancing mass involving segment 7, 8, 4A and 4B, which shows washout on delayed images. In a patient with cirrhosis or viral hepatitis findings are highly concerning for infiltrative hepatocellular carcinoma. In the absence risk factors for hepatocellular carcinoma other potential diagnostic considerations include intrahepatic cholangiocarcinoma or less likely metastatic disease. Clinical correlation including assessment alpha fetoprotein levels advised. Several additional smaller satellite lesions identified which also exhibit arterial phase enhancement inter suspicious for malignancy. 2. Right atrial mass, indeterminate. 3. Mild splenomegaly. 4. Trace bilateral pleural effusions and small volume of ascites. 5. Borderline enlarged upper abdominal lymph nodes are identified. Cannot exclude metastatic.  Echo 06/16/21:   Left Ventricle: Left ventricular ejection fraction, by estimation, is 60 to 65%. The left ventricle has normal function. The left ventricle has no regional wall motion abnormalities. Definity contrast agent was given IV to delineate the left ventricular endocardial borders. The left ventricular internal cavity size was normal in size. There is no left ventricular hypertrophy. Left ventricular diastolic parameters were normal. Right Ventricle: The right ventricular size is normal. Right vetricular wall thickness was not well visualized. Right ventricular systolic function is normal. Left Atrium: Left  atrial size was normal in size. Right Atrium: There is a large mass in the Right atrium. Possibilities include atrial myxoma or thrombus. Suggest TEE for better views of this mass. Right atrial size was normal in size.   Request made for liver lesion biopsy Dr Anselm Pancoast has approved procedure Planned for 06/20/21  Pt is also scheduled for TEE 7/5   Past Medical History:  Diagnosis Date   BPH (benign prostatic hyperplasia)    COPD (chronic obstructive pulmonary disease) (HCC)    COPD (chronic obstructive pulmonary disease) ([place])    COVID-19    Diabetes mellitus without complication (HCC)    GERD (gastroesophageal reflux disease)    Hypercholesterolemia    Hypertension    Seizures (HCC)    Type II or unspecified type diabetes mellitus without mention of complication, not stated as uncontrolled     Past Surgical History:  Procedure Laterality Date   HAND SURGERY Left 1953   KNEE ARTHROSCOPY Left 1998    Allergies: Atorvastatin, Latex, Penicillin g, and Penicillins  Medications: Prior to Admission medications   Medication Sig Start Date End Date Taking? Authorizing Provider  albuterol (VENTOLIN HFA) 108 (90 Base) MCG/ACT inhaler Inhale 1-2 puffs into the lungs every 6 (six) hours as needed for wheezing or shortness of breath. Patient taking differently: Inhale 2 puffs into the lungs every 6 (six) hours as needed for wheezing or shortness of breath. 11/17/20  Yes Scot Jun, FNP  amLODipine (NORVASC) 5 MG tablet Take 5 mg by mouth daily.   Yes [provider]  ascorbic acid (VITAMIN C) 250 MG tablet Take 250 mg by mouth daily.   Yes [provider]  aspirin 81 MG chewable tablet Chew 81 mg by mouth daily.   Yes [provider]  Cholecalciferol (VITAMIN D3) 125 MCG (5000 UT)  CAPS Take 5,000 Units by mouth daily.   Yes [provider]  divalproex (DEPAKOTE ER) 500 MG 24 hr tablet Take 1 tablet (500 mg total) by mouth 2 (two) times  daily. Patient taking differently: Take 500 mg by mouth in the morning. 10/11/17  Yes Unk Pinto, MD  empagliflozin (JARDIANCE) 25 MG TABS tablet Take 12.5 mg by mouth daily.   Yes [provider]  ferrous sulfate 325 (65 FE) MG tablet Take 325 mg by mouth daily with breakfast.   Yes [provider]  finasteride (PROSCAR) 5 MG tablet Take 5 mg by mouth daily after supper.   Yes [provider]  fluticasone (FLONASE) 50 MCG/ACT nasal spray Place 2 sprays into both nostrils daily. Patient taking differently: Place 2 sprays into both nostrils daily as needed for allergies. 12/05/15  Yes Rolene Course, PA-C  furosemide (LASIX) 20 MG tablet Take 1 tablet (20 mg total) by mouth daily. 11/02/18  Yes Amyot, Nicholes Stairs, NP  Garlic 8841 MG CAPS Take 1,000 mg by mouth daily.   Yes [provider]  glipiZIDE (GLUCOTROL) 10 MG tablet Take 10 mg by mouth 2 (two) times daily before a meal.   Yes [provider]  ipratropium-albuterol (DUONEB) 0.5-2.5 (3) MG/3ML SOLN Take 3 mLs by nebulization every 6 (six) hours as needed (wheezing, shortness of breath). 03/16/19  Yes Wieters, Hallie C, PA-C  lisinopril (PRINIVIL,ZESTRIL) 40 MG tablet Take 40 mg by mouth daily.   Yes [provider]  loratadine (CLARITIN) 10 MG tablet Take 10 mg by mouth daily.   Yes [provider]  Magnesium Oxide 420 MG TABS Take 420 mg by mouth 2 (two) times daily.   Yes [provider]  metFORMIN (GLUCOPHAGE) 850 MG tablet Take 850 mg by mouth 3 (three) times daily.   Yes [provider]  metoprolol tartrate (LOPRESSOR) 50 MG tablet Take 25 mg by mouth 2 (two) times daily. Hold dose for sbp less than 100 or pulse less than 50   Yes [provider]  naproxen sodium (ALEVE) 220 MG tablet Take 660 mg by mouth 2 (two) times daily as needed (pain/headache).   Yes [provider]  Omega-3 Fatty Acids (FISH OIL) 1000 MG CAPS Take 1,000 mg by  mouth daily.   Yes [provider]  omeprazole (PRILOSEC) 20 MG capsule Take 20 mg by mouth daily.   Yes [provider]  OVER THE COUNTER MEDICATION Apply 1 application topically as needed (itching). [place] anti-itch cream   Yes [provider]  prochlorperazine (COMPAZINE) 5 MG tablet Take 5 mg by mouth 2 (two) times daily. Lunch and Bedtime   Yes [provider]  rosuvastatin (CRESTOR) 20 MG tablet Take 10 mg by mouth daily.   Yes [provider]  fluticasone furoate-vilanterol (BREO ELLIPTA) 100-25 MCG/INH AEPB Inhale 1 puff into the lungs daily. Patient not taking: Reported on 06/16/2021 02/29/20   Augusto Gamble B, NP  metFORMIN (GLUCOPHAGE XR) 500 MG 24 hr tablet Take 2 tablets 2 x / day for Diabetes Patient not taking: Reported on 06/16/2021 10/02/17 02/29/20  Unk Pinto, MD  rosuvastatin (CRESTOR) 40 MG tablet Take 40 mg by mouth daily. Patient not taking: Reported on 06/16/2021    [provider]     Family History  Problem Relation Age of Onset   CVA Mother    Thyroid disease Mother    Heart disease Father    Diabetes Sister    Heart disease Sister  Diabetes Brother    Heart disease Brother    Epilepsy Brother    Schizophrenia Brother     Social History   Socioeconomic History   Marital status: Married    Spouse name: Not on file   Number of children: Not on file   Years of education: Not on file   Highest education level: Not on file  Occupational History   Not on file  Tobacco Use   Smoking status: Former    Pack years: 0.00    Types: Cigarettes    Quit date: 12/17/2013    Years since quitting: 7.5   Smokeless tobacco: Never  Vaping Use   Vaping Use: Never used  Substance and Sexual Activity   Alcohol use: No    Alcohol/week: 0.0 standard drinks   Drug use: No   Sexual activity: Not on file  Other Topics Concern   Not on file  Social History Narrative   Not on file   Social Determinants of Health    Financial Resource Strain: Not on file  Food Insecurity: Not on file  Transportation Needs: Not on file  Physical Activity: Not on file  Stress: Not on file  Social Connections: Not on file    Review of Systems: A 12 point ROS discussed and pertinent positives are indicated in the HPI above.  All other systems are negative.  Review of Systems  Constitutional:  Positive for activity change, appetite change and fatigue. Negative for fever.  HENT:  Negative for trouble swallowing.   Cardiovascular:  Negative for chest pain.  Gastrointestinal:  Positive for diarrhea. Negative for abdominal pain and nausea.  Psychiatric/Behavioral:  Negative for behavioral problems and confusion.    Vital Signs: BP 133/63 (BP Location: Right Arm)   Pulse 94   Temp 98.7 F (37.1 C)   Resp 16   Ht 5\' 11"  (1.803 m)   Wt 177 lb 4 oz (80.4 kg)   SpO2 97%   BMI 24.72 kg/m   Physical Exam Vitals reviewed.  HENT:     Mouth/Throat:     Mouth: Mucous membranes are moist.  Cardiovascular:     Rate and Rhythm: Normal rate and regular rhythm.     Heart sounds: Normal heart sounds.  Pulmonary:     Effort: Pulmonary effort is normal.     Breath sounds: Normal breath sounds.  Abdominal:     Palpations: Abdomen is soft.     Tenderness: There is no abdominal tenderness.  Musculoskeletal:        General: Normal range of motion.  Skin:    General: Skin is warm.  Neurological:     Mental Status: He is alert and oriented to person, place, and time.  Psychiatric:        Behavior: Behavior normal.    Imaging: DG Chest 2 View  Result Date: 06/15/2021 CLINICAL DATA:  Shortness of breath with cough and congestion EXAM: CHEST - 2 VIEW COMPARISON:  November 17, 2020 FINDINGS: Lungs are clear. Heart size and pulmonary vascularity are normal. No adenopathy. There is aortic atherosclerosis. No bone lesions. IMPRESSION: Lungs clear. Cardiac silhouette normal. Aortic Atherosclerosis (ICD10-I70.0). Electronically  Signed   By: Lowella Grip III M.D.   On: 06/15/2021 09:20   CT Abdomen Pelvis W Contrast  Result Date: 06/15/2021 CLINICAL DATA:  Left lower quadrant abdominal pain EXAM: CT ABDOMEN AND PELVIS WITH CONTRAST TECHNIQUE: Multidetector CT imaging of the abdomen and pelvis was performed using the standard protocol following bolus administration  of intravenous contrast. CONTRAST:  122mL OMNIPAQUE IOHEXOL 350 MG/ML SOLN COMPARISON:  None. FINDINGS: Lower chest: Clustered, heterogeneous airspace disease in the right greater than left lung bases (series 5, image 10). Coronary artery calcifications. Hepatobiliary: Large, somewhat ill-defined, masslike heterogeneous lesion of the anterior right lobe of the liver, with areas of heterogeneous hypodensity and capsular retraction, the central, dominant component measuring approximately 11.7 x 6.8 cm (series 3, image 18). Small gallstones near the gallbladder neck. Pancreas: Unremarkable. No pancreatic ductal dilatation or surrounding inflammatory changes. Spleen: Normal in size without significant abnormality. Adrenals/Urinary Tract: Adrenal glands are unremarkable. Kidneys are normal, without renal calculi, solid lesion, or hydronephrosis. Bladder is unremarkable. Stomach/Bowel: Stomach is within normal limits. Appendix appears normal. No evidence of bowel wall thickening, distention, or inflammatory changes. Vascular/Lymphatic: Aortic atherosclerosis. No enlarged abdominal or pelvic lymph nodes. Reproductive: No mass or other significant abnormality. Other: No abdominal wall hernia or abnormality. No abdominopelvic ascites. Musculoskeletal: No acute or significant osseous findings. IMPRESSION: 1. No acute CT findings of the abdomen or pelvis to explain left lower quadrant abdominal pain. 2. Clustered, heterogeneous airspace disease in the right greater than left lung bases , concerning for infection or aspiration. 3. Large, somewhat ill-defined, masslike heterogeneous  lesion of the anterior right lobe of the liver, with areas of heterogeneous hypodensity and capsular retraction, the central, dominant component measuring approximately 11.7 x 6.8 cm This is of uncertain nature but concerning for malignancy, particularly intrahepatic cholangiocarcinoma. Recommend nonemergent multiphasic contrast enhanced MRI to further evaluate. 4. Cholelithiasis. 5. Coronary artery disease. Aortic Atherosclerosis (ICD10-I70.0). Electronically Signed   By: Eddie Candle M.D.   On: 06/15/2021 12:47   MR LIVER W WO CONTRAST  Result Date: 06/17/2021 CLINICAL DATA:  Evaluate liver mass. EXAM: MRI ABDOMEN WITHOUT AND WITH CONTRAST TECHNIQUE: Multiplanar multisequence MR imaging of the abdomen was performed both before and after the administration of intravenous contrast. CONTRAST:  24mL GADAVIST GADOBUTROL 1 MMOL/ML IV SOLN COMPARISON:  06/15/2021 FINDINGS: Lower chest: Trace bilateral pleural effusions. Filling defect within the right atrium is identified measuring approximately 4.4 cm. The intrahepatic IVC appears patent. Hepatobiliary: On the arterial phase images there is a large hyperenhancing mass involving segment 7, 8, 4 a, and 4B. This measures 14.4 by 9.4 by 9.4 cm. The mass has heterogeneous arterial phase enhancement. On the delayed images there is washout of contrast from within this mass. Several additional, smaller satellite lesions are identified which also exhibit arterial phase enhancement including a 1.3 cm lesion in the periphery of the right hepatic lobe, image 59/1001. The gallbladder is collapsed and contains a 6 mm stone. Mild edema the gallbladder wall. No signs of bile duct dilatation. Pancreas: No mass, inflammatory changes, or other parenchymal abnormality identified. Spleen: The spleen measures 14.4 by 9.8 by 5.8 cm (volume = 430 cm^3). No focal splenic abnormality. Adrenals/Urinary Tract: Normal appearance of the adrenal glands. No kidney mass or hydronephrosis.  Stomach/Bowel: Visualized portions within the abdomen are unremarkable. Vascular/Lymphatic: Normal caliber of the abdominal aorta.  Portal vein appears patent. Porta hepatics lymph node is borderline enlarged measuring 1 cm, image 21/. Gastrohepatic ligament nodes measure 1.2 cm, image 18/6. Portacaval lymph node measures 1 cm, image 22/6 Other:  Small volume of ascites identified. Musculoskeletal: No suspicious bone lesions identified. IMPRESSION: 1. Large arterial phase enhancing mass involving segment 7, 8, 4A and 4B, which shows washout on delayed images. In a patient with cirrhosis or viral hepatitis findings are highly concerning for infiltrative hepatocellular carcinoma. In the  absence risk factors for hepatocellular carcinoma other potential diagnostic considerations include intrahepatic cholangiocarcinoma or less likely metastatic disease. Clinical correlation including assessment alpha fetoprotein levels advised. Several additional smaller satellite lesions identified which also exhibit arterial phase enhancement inter suspicious for malignancy. 2. Right atrial mass, indeterminate. 3. Mild splenomegaly. 4. Trace bilateral pleural effusions and small volume of ascites. 5. Borderline enlarged upper abdominal lymph nodes are identified. Cannot exclude metastatic. Electronically Signed   By: Kerby Moors M.D.   On: 06/17/2021 18:58   ECHOCARDIOGRAM COMPLETE  Result Date: 06/16/2021    ECHOCARDIOGRAM REPORT   Patient Name:   Trevor [name] Date of Exam: 06/16/2021 Medical Rec #:  [id number]     Height:       71.0 in Accession #:    [id number]    Weight:       177.2 lb Date of Birth:  Jun 12, 1946     BSA:          2.003 m Patient Age:    26 years      BP:           140/62 mmHg Patient Gender: M             HR:           87 bpm. Exam Location:  Inpatient Procedure: 2D Echo, Cardiac Doppler, Color Doppler and Intracardiac            Opacification Agent Indications:    Other abnormalities of the heart R00.8   History:        Patient has no prior history of Echocardiogram examinations.                 COPD; Risk Factors:Hypertension, Diabetes, Dyslipidemia and                 Former Smoker. GERD.  Sonographer:    Vickie Epley RDCS Referring Phys: [id number] [facility]  Sonographer Comments: Mass in heart, reported by [place]. IMPRESSIONS  1. Left ventricular ejection fraction, by estimation, is 60 to 65%. The left ventricle has normal function. The left ventricle has no regional wall motion abnormalities. Left ventricular diastolic parameters were normal.  2. Right ventricular systolic function is normal. The right ventricular size is normal.  3. There is a large mass in the Right atrium. Possibilities include atrial myxoma or thrombus. Suggest TEE for better views of this mass.  4. The mitral valve is grossly normal. No evidence of mitral valve regurgitation.  5. The aortic valve is grossly normal. There is mild calcification of the aortic valve. Aortic valve regurgitation is not visualized. No aortic stenosis is present. FINDINGS  Left Ventricle: Left ventricular ejection fraction, by estimation, is 60 to 65%. The left ventricle has normal function. The left ventricle has no regional wall motion abnormalities. Definity contrast agent was given IV to delineate the left ventricular  endocardial borders. The left ventricular internal cavity size was normal in size. There is no left ventricular hypertrophy. Left ventricular diastolic parameters were normal. Right Ventricle: The right ventricular size is normal. Right vetricular wall thickness was not well visualized. Right ventricular systolic function is normal. Left Atrium: Left atrial size was normal in size. Right Atrium: There is a large mass in the Right atrium. Possibilities include atrial myxoma or thrombus. Suggest TEE for better views of this mass. Right atrial size was normal in size. Pericardium: There is no evidence of pericardial effusion. Mitral Valve: The  mitral valve is grossly normal. No  evidence of mitral valve regurgitation. Tricuspid Valve: The tricuspid valve is grossly normal. Tricuspid valve regurgitation is mild. Aortic Valve: The aortic valve is grossly normal. There is mild calcification of the aortic valve. Aortic valve regurgitation is not visualized. No aortic stenosis is present. Pulmonic Valve: The pulmonic valve was grossly normal. Pulmonic valve regurgitation is not visualized. Aorta: The aortic root and ascending aorta are structurally normal, with no evidence of dilitation. IAS/Shunts: The atrial septum is grossly normal.  LEFT VENTRICLE PLAX 2D LVIDd:         5.20 cm      Diastology LVIDs:         3.90 cm      LV e' medial:    6.15 cm/s LV PW:         0.80 cm      LV E/e' medial:  10.7 LV IVS:        0.80 cm      LV e' lateral:   8.03 cm/s LVOT diam:     2.10 cm      LV E/e' lateral: 8.2 LVOT Area:     3.46 cm  LV Volumes (MOD) LV vol d, MOD A2C: 95.4 ml LV vol d, MOD A4C: 104.0 ml LV vol s, MOD A2C: 48.4 ml LV vol s, MOD A4C: 48.5 ml LV SV MOD A2C:     47.0 ml LV SV MOD A4C:     104.0 ml LV SV MOD BP:      52.7 ml RIGHT VENTRICLE RV S prime:     9.15 cm/s TAPSE (M-mode): 2.0 cm LEFT ATRIUM           Index       RIGHT ATRIUM           Index LA diam:      3.10 cm 1.55 cm/m  RA Area:     21.20 cm LA Vol (A2C): 34.6 ml 17.27 ml/m RA Volume:   65.20 ml  32.55 ml/m LA Vol (A4C): 29.7 ml 14.83 ml/m   AORTA Ao Root diam: 3.40 cm Ao Asc diam:  3.50 cm MITRAL VALVE MV Area (PHT): 3.60 cm    SHUNTS MV Decel Time: 211 msec    Systemic Diam: 2.10 cm MV E velocity: 66.00 cm/s MV A velocity: 62.60 cm/s MV E/A ratio:  1.05 Mertie Moores MD Electronically signed by Mertie Moores MD Signature Date/Time: 06/16/2021/12:05:49 PM    Final     Labs:  CBC: Recent Labs    06/16/21 1317 06/17/21 0249 06/18/21 0323 06/19/21 0345  WBC 11.1* 11.8* 11.8* 11.6*  HGB 9.0* 9.0* 9.2* 9.4*  HCT 30.9* 29.5* 30.1* 30.9*  PLT 467* 460* 447* 421*    COAGS: No  results for input(s): INR, APTT in the last 8760 hours.  BMP: Recent Labs    06/15/21 0734 06/16/21 0126 06/18/21 0323 06/19/21 0345  NA 135 140 139 137  K 5.0 4.0 3.8 3.9  CL 101 108 106 109  CO2 22 22 21* 20*  GLUCOSE 220* 202* 137* 93  BUN 22 15 9 11   CALCIUM 9.4 9.0 8.8* 8.9  CREATININE 1.32* 1.06 0.87 0.94  GFRNONAA 57* >60 >60 >60    LIVER FUNCTION TESTS: Recent Labs    06/15/21 0734 06/16/21 0126  BILITOT 1.5* 1.2  AST 59* 48*  ALT 51* 46*  ALKPHOS 179* 156*  PROT 6.7 5.7*  ALBUMIN 2.8* 2.2*    TUMOR MARKERS: No results for input(s): AFPTM, CEA, CA199, CHROMGRNA  in the last 8760 hours.  Assessment and Plan:  Admitted through ED for PNA Imaging work up revealing liver lesion Echo revealing Rt atrial mass Scheduled for TEE 7/5 Scheduled for possible liver biopsy 7/5 in IR if timing permits Risks and benefits of liver lesion biopsy was discussed with the patient and/or patient's family including, but not limited to bleeding, infection, damage to adjacent structures or low yield requiring additional tests.  All of the questions were answered and there is agreement to proceed.  Consent signed and in chart.   Thank you for this interesting consult.  I greatly enjoyed meeting Trevor [name] and look forward to participating in their care.  A copy of this report was sent to the requesting provider on this date.  Electronically Signed: Lavonia Drafts, PA-C 06/19/2021, 9:21 AM   I spent a total of 40 Minutes    in face to face in clinical consultation, greater than 50% of which was counseling/coordinating care for liver lesion bx

## 2021-06-20 ENCOUNTER — Inpatient Hospital Stay (HOSPITAL_COMMUNITY): Payer: No Typology Code available for payment source

## 2021-06-20 HISTORY — PX: IR US GUIDE BX ASP/DRAIN: IMG2392

## 2021-06-20 LAB — AFP TUMOR MARKER: AFP, Serum, Tumor Marker: 646 ng/mL — ABNORMAL HIGH (ref 0.0–8.4)

## 2021-06-20 LAB — CULTURE, BLOOD (ROUTINE X 2)
Culture: NO GROWTH
Special Requests: ADEQUATE

## 2021-06-20 LAB — CEA: CEA: 1.7 ng/mL (ref 0.0–4.7)

## 2021-06-20 LAB — GLUCOSE, CAPILLARY
Glucose-Capillary: 114 mg/dL — ABNORMAL HIGH (ref 70–99)
Glucose-Capillary: 143 mg/dL — ABNORMAL HIGH (ref 70–99)
Glucose-Capillary: 122 mg/dL — ABNORMAL HIGH (ref 70–99)
Glucose-Capillary: 114 mg/dL — ABNORMAL HIGH (ref 70–99)

## 2021-06-20 LAB — CANCER ANTIGEN 19-9: CA 19-9: 35 U/mL (ref 0–35)

## 2021-06-20 MED ORDER — GELATIN ABSORBABLE 12-7 MM EX MISC
CUTANEOUS | Status: AC
  Filled 2021-06-20: qty 1

## 2021-06-20 MED ORDER — LIDOCAINE HCL (PF) 1 % IJ SOLN
INTRAMUSCULAR | Status: AC
  Administered 2021-06-20: 4 mL
  Filled 2021-06-20: qty 30

## 2021-06-20 MED ORDER — FENTANYL CITRATE (PF) 100 MCG/2ML IJ SOLN
INTRAMUSCULAR | Status: AC
  Filled 2021-06-20: qty 2

## 2021-06-20 MED ORDER — MIDAZOLAM HCL 2 MG/2ML IJ SOLN
INTRAMUSCULAR | Status: AC | PRN
  Administered 2021-06-20: 0.5 mg via INTRAVENOUS
  Administered 2021-06-20: 1 mg via INTRAVENOUS

## 2021-06-20 MED ORDER — MIDAZOLAM HCL 2 MG/2ML IJ SOLN
INTRAMUSCULAR | Status: AC
  Filled 2021-06-20: qty 2

## 2021-06-20 MED ORDER — FENTANYL CITRATE (PF) 100 MCG/2ML IJ SOLN
INTRAMUSCULAR | Status: AC | PRN
  Administered 2021-06-20 (×2): 25 ug via INTRAVENOUS

## 2021-06-20 NOTE — Plan of Care (Signed)
  Problem: Education: Goal: Knowledge of General Education information will improve Description: Including pain rating scale, medication(s)/side effects and non-pharmacologic comfort measures Outcome: Progressing   Problem: Nutrition: Goal: Adequate nutrition will be maintained Outcome: Progressing   Problem: Coping: Goal: Level of anxiety will decrease Outcome: Progressing   Problem: Elimination: Goal: Will not experience complications related to urinary retention Outcome: Progressing   

## 2021-06-20 NOTE — Progress Notes (Signed)
Subjective:  Doing well denies any chest pain or shortness of breath.  TEE could not be done today due to conflicting schedule.  Tolerated liver biopsy earlier today.  Scheduled for TEE tomorrow in a.m.  Objective:  Vital Signs in the last 24 hours: Temp:  [97.8 F (36.6 C)-98.2 F (36.8 C)] 98.2 F (36.8 C) (07/05 0607) Pulse Rate:  [78-86] 78 (07/05 0930) Resp:  [15-18] 16 (07/05 0930) BP: (129-156)/(55-72) 144/63 (07/05 0930) SpO2:  [93 %-100 %] 98 % (07/05 0930)  Intake/Output from previous day: No intake/output data recorded. Intake/Output from this shift: No intake/output data recorded.  Physical Exam: Neck: no adenopathy, no carotid bruit, no JVD, supple, symmetrical, trachea midline, and thyroid not enlarged, symmetric, no tenderness/mass/nodules Lungs: clear to auscultation bilaterally Heart: regular rate and rhythm, S1, S2 normal, and 2/6 systolic murmur noted Abdomen: soft, non-tender; bowel sounds normal; no masses,  no organomegaly Extremities: extremities normal, atraumatic, no cyanosis or edema  Lab Results: Recent Labs    06/18/21 0323 06/19/21 0345  WBC 11.8* 11.6*  HGB 9.2* 9.4*  PLT 447* 421*   Recent Labs    06/18/21 0323 06/19/21 0345  NA 139 137  K 3.8 3.9  CL 106 109  CO2 21* 20*  GLUCOSE 137* 93  BUN 9 11  CREATININE 0.87 0.94   No results for input(s): TROPONINI in the last 72 hours.  Invalid input(s): CK, MB Hepatic Function Panel No results for input(s): PROT, ALBUMIN, AST, ALT, ALKPHOS, BILITOT, BILIDIR, IBILI in the last 72 hours. No results for input(s): CHOL in the last 72 hours. No results for input(s): PROTIME in the last 72 hours.  Imaging: CT CHEST W CONTRAST  Result Date: 06/19/2021 CLINICAL DATA:  75 year old with hepatic mass and weight loss. Malignancy evaluation. EXAM: CT CHEST WITH CONTRAST TECHNIQUE: Multidetector CT imaging of the chest was performed during intravenous contrast administration. CONTRAST:  5mL  OMNIPAQUE IOHEXOL 300 MG/ML  SOLN COMPARISON:  CT abdomen 06/15/2021.  MRI abdomen 06/17/2021 FINDINGS: Cardiovascular: Atherosclerotic calcifications involving the thoracic aorta without enlargement. Coronary artery calcifications. Heart size is normal without significant pericardial fluid. Mediastinum/Nodes: Tiny low-density in the right thyroid lobe. No suspicious thyroid nodules. Mildly prominent nodal tissue at the AP window measuring 1.1 cm in the short axis on sequence 3, image 63. Subcarinal tissue is mildly prominent measuring 1.1 cm in the short axis on image 83, sequence 3. No significant hilar lymph node enlargement. No axillary lymph node enlargement. Small lymph nodes along the distal esophagus are nonspecific. Mildly prominent lymph node in the right epicardial fat on sequence 3, image 118 measures 9 mm in the short axis. Lungs/Pleura: Trachea and mainstem bronchi are patent. Small patchy densities in the right lower lobe. Some of these lung densities have a tree-in-bud configuration. Similar small nodular and tree-in-bud configurations in the left lower lobe. Small amount of posterior atelectasis or consolidation in the right lower lobe. 5 mm peripheral nodule in the right lower lobe on sequence 5 image 141. No pleural effusions. Upper Abdomen: Again noted is a poorly defined lesion involving the right hepatic lobe and portions of medial left hepatic lobe. Main portal venous system is patent but there could be involvement or displacement of the right portal venous system. Small lymph nodes in the upper abdomen and porta hepatis region. Musculoskeletal: No acute bone abnormality. IMPRESSION: 1. Patchy small densities in both lower lobes. Some of these densities have a tree-in-bud configuration and findings are suggestive for an infectious or inflammatory  process. Small amount of consolidation in the medial right lower lobe. 2. No clear evidence for neoplasm or metastatic disease within the chest.  Borderline sized mediastinal lymph nodes are indeterminate. In addition, there is an indeterminate 5 mm nodule at the right lung base. Recommend attention to these areas on follow-up imaging. 3. Large infiltrative lesion involving the liver. This is compatible with a neoplastic process and refer to the recent MRI examination. 4.  Aortic Atherosclerosis (ICD10-I70.0). Electronically Signed   By: Markus Daft M.D.   On: 06/19/2021 15:16   IR US Guide Bx Asp/Drain  Result Date: 06/20/2021 INDICATION: Right liver mass EXAM: Ultrasound-guided biopsy of right liver mass MEDICATIONS: None. ANESTHESIA/SEDATION: Moderate (conscious) sedation was employed during this procedure. A total of Versed 1.5 mg and Fentanyl 50 mcg was administered intravenously. Moderate Sedation Time: 15 minutes. The patient's level of consciousness and vital signs were monitored continuously by radiology nursing throughout the procedure under my direct supervision. COMPLICATIONS: None immediate. PROCEDURE: Informed written consent was obtained from the patient after a thorough discussion of the procedural risks, benefits and alternatives. All questions were addressed. Maximal Sterile Barrier Technique was utilized including caps, mask, sterile gowns, sterile gloves, sterile drape, hand hygiene and skin antiseptic. A timeout was performed prior to the initiation of the procedure. Patient position supine on the ultrasound table. Right upper quadrant skin prepped and draped in usual sterile fashion. Following local lidocaine administration, 17 gauge introducer needle was advanced into the right liver lesion, and 4- 18 gauge cores were obtained utilizing continuous ultrasound guidance. Gelfoam slurry was administered through the introducer needle at the biopsy site. Samples were sent to pathology in formalin. Needle removed and hemostasis achieved with 5 minutes of manual compression. IMPRESSION: Ultrasound-guided biopsy of right liver mass as above.  Electronically Signed   By: Miachel Roux M.D.   On: 06/20/2021 13:07    Cardiac Studies:  Assessment/Plan:  Right atrial mass probably atrial myxoma versus questionable mets Hepatic mass status post ultrasound-guided liver biopsy by IR today Hypertension Diabetes mellitus Hypercholesterolemia COPD Resolving bilateral pneumonia Tobacco abuse History of EtOH abuse GERD Seizure disorder Plan Keep n.p.o. after midnight Scheduled for TEE in a.m. by Dr. Doylene Canard  LOS: 4 days    Trevor [name] 06/20/2021, 4:19 PM

## 2021-06-20 NOTE — Progress Notes (Addendum)
Subjective:  O/N Events: None Mr. Masden was seen on rounds this morning. He states that his breathing and cough is much improved from admission. He reports one well-formed BM over the last 24 hrs. Wife reports that earlier he was stating that he does not feel like himself today.  He shares that he has already returned from his liver biopsy and is hungry.   Objective:  Vital signs in last 24 hours: Vitals:   06/19/21 0750 06/19/21 1637 06/19/21 2136 06/20/21 0607  BP: 133/63 (!) 156/63 (!) 146/66 135/72  Pulse: 94 80 81 79  Resp: 16 18 17 18   Temp: 98.7 F (37.1 C) 97.8 F (36.6 C) 98.1 F (36.7 C) 98.2 F (36.8 C)  TempSrc:      SpO2: 97% 99% 98% 93%  Weight:      Height:        Constitutional: well-appearing male, sitting in bed, in no acute distress Cardiovascular: regular rate and rhythm, no extra heart sounds, no pitting edema on BLEs Pulmonary/Chest: normal work of breathing on room air Abdominal: soft, non-tender, non-distended Skin: warm and dry Psych: cheerful affect, mood congruent   CBC Latest Ref Rng & Units 06/19/2021 06/18/2021 06/17/2021  WBC 4.0 - 10.5 K/uL 11.6(H) 11.8(H) 11.8(H)  Hemoglobin 13.0 - 17.0 g/dL 9.4(L) 9.2(L) 9.0(L)  Hematocrit 39.0 - 52.0 % 30.9(L) 30.1(L) 29.5(L)  Platelets 150 - 400 K/uL 421(H) 447(H) 460(H)    BMP Latest Ref Rng & Units 06/19/2021 06/18/2021 06/16/2021  Glucose 70 - 99 mg/dL 93 137(H) 202(H)  BUN 8 - 23 mg/dL 11 9 15   Creatinine 0.61 - 1.24 mg/dL 0.94 0.87 1.06  BUN/Creat Ratio 6 - 22 (calc) - - -  Sodium 135 - 145 mmol/L 137 139 140  Potassium 3.5 - 5.1 mmol/L 3.9 3.8 4.0  Chloride 98 - 111 mmol/L 109 106 108  CO2 22 - 32 mmol/L 20(L) 21(L) 22  Calcium 8.9 - 10.3 mg/dL 8.9 8.8(L) 9.0    Assessment/Plan:  Principal Problem:   Pneumonia Active Problems:   Acute kidney injury (HCC)   Normocytic anemia   Right atrial mass   Liver mass   Malnutrition of moderate degree   Mr. Blackard is a 75 year old gentleman with past  medical history of hypertension, diabetes, BPH, and COPD who was admitted for community-acquired pneumonia, and is currently being worked up for a liver mass.   Hepatic Mass, Cardiac Mass likely due to new malignancy 14.4 x 9.4 x 9.4 cm hepatic mass with satellite lesions visualized on hepatic MRI. 5 mm nodule in RLL and bilateral patchy opacities similar to prior visualized on CT Chest. LDH 453. Patient underwent liver mass biopsy today due to risk for malignancy (HCC vs. Cholangiocarcinoma vs. Metastasis) given concurrent unintentional weight loss and iron-deficiency anemia. TTE this admission showed right atrial mass and patient will undergo TEE tomorrow for better visualization of mass. - TEE tomorrow; cardiology following - f/u cancer markers: AFP, CEA, CA 19-9 - outpatient follow up with cancer center - will follow up liver biopsy pathology   Iron Deficiency Anemia (Stable)  Hgb 9.4 and MCV 79.4 on 7/4 stable from prior. S/p Ferraheme 7/3.  Community Acquired Pneumonia (Resolved) He has completed antibiotic course x5 days for CAP. He remains afebrile.   N.p.o. after midnight for TEE on 7/6.  Prior to Admission Living Arrangement: Home Anticipated Discharge Location: Home Barriers to Discharge: Continue medical work up Dispo: Anticipated discharge on 7/6 following the TEE if it goes well  Ok Edwards, Medical Student 06/20/2021, 7:02 AM Pager: [phone] After 5pm on weekdays and 1pm on weekends: On Call pager [phone]

## 2021-06-20 NOTE — Procedures (Signed)
Interventional Radiology Procedure Note  Procedure: US guided liver lesions biopsy  Indication: Right liver mass  Findings: Please refer to procedural dictation for full description.  Complications: None  EBL: < 10 mL  Miachel Roux, MD [phone]

## 2021-06-21 ENCOUNTER — Encounter (HOSPITAL_COMMUNITY)
Admission: EM | Disposition: A | Discharge status: Home / Self Care | Payer: Self-pay | Source: Home / Self Care | Attending: Student in an Organized Health Care Education/Training Program

## 2021-06-21 ENCOUNTER — Encounter (HOSPITAL_COMMUNITY): Payer: Self-pay | Admitting: Student in an Organized Health Care Education/Training Program

## 2021-06-21 ENCOUNTER — Inpatient Hospital Stay (HOSPITAL_COMMUNITY): Payer: No Typology Code available for payment source

## 2021-06-21 DIAGNOSIS — I340 Nonrheumatic mitral (valve) insufficiency: Secondary | ICD-10-CM

## 2021-06-21 DIAGNOSIS — I5189 Other ill-defined heart diseases: Secondary | ICD-10-CM

## 2021-06-21 DIAGNOSIS — I700 Atherosclerosis of aorta: Secondary | ICD-10-CM

## 2021-06-21 HISTORY — PX: TEE WITHOUT CARDIOVERSION: SHX5443

## 2021-06-21 HISTORY — PX: BUBBLE STUDY: SHX6837

## 2021-06-21 LAB — GLUCOSE, CAPILLARY
Glucose-Capillary: 124 mg/dL — ABNORMAL HIGH (ref 70–99)
Glucose-Capillary: 201 mg/dL — ABNORMAL HIGH (ref 70–99)
Glucose-Capillary: 194 mg/dL — ABNORMAL HIGH (ref 70–99)
Glucose-Capillary: 159 mg/dL — ABNORMAL HIGH (ref 70–99)

## 2021-06-21 SURGERY — ECHOCARDIOGRAM, TRANSESOPHAGEAL
Anesthesia: Monitor Anesthesia Care

## 2021-06-21 MED ORDER — SODIUM CHLORIDE 0.9 % IV SOLN: INTRAVENOUS | Status: DC

## 2021-06-21 MED ORDER — PROPOFOL 500 MG/50ML IV EMUL
INTRAVENOUS | Status: DC | PRN
  Administered 2021-06-21: 70 ug/kg/min via INTRAVENOUS

## 2021-06-21 MED ORDER — LIDOCAINE 2% (20 MG/ML) 5 ML SYRINGE
INTRAMUSCULAR | Status: DC | PRN
  Administered 2021-06-21: 100 mg via INTRAVENOUS

## 2021-06-21 MED ORDER — BUTAMBEN-TETRACAINE-BENZOCAINE 2-2-14 % EX AERO
INHALATION_SPRAY | CUTANEOUS | Status: DC | PRN
  Administered 2021-06-21: 2 via TOPICAL

## 2021-06-21 MED ORDER — PROPOFOL 10 MG/ML IV BOLUS
INTRAVENOUS | Status: DC | PRN
  Administered 2021-06-21: 30 mg via INTRAVENOUS
  Administered 2021-06-21: 50 mg via INTRAVENOUS

## 2021-06-21 MED ORDER — PHENYLEPHRINE 40 MCG/ML (10ML) SYRINGE FOR IV PUSH (FOR BLOOD PRESSURE SUPPORT)
PREFILLED_SYRINGE | INTRAVENOUS | Status: DC | PRN
  Administered 2021-06-21 (×2): 80 ug via INTRAVENOUS

## 2021-06-21 NOTE — Anesthesia Postprocedure Evaluation (Signed)
Anesthesia Post Note  Patient: Trevor [name]  Procedure(s) Performed: TRANSESOPHAGEAL ECHOCARDIOGRAM (TEE) BUBBLE STUDY     Patient location during evaluation: Endoscopy Anesthesia Type: MAC Level of consciousness: awake and alert Pain management: pain level controlled Vital Signs Assessment: post-procedure vital signs reviewed and stable Respiratory status: spontaneous breathing, nonlabored ventilation and respiratory function stable Cardiovascular status: blood pressure returned to baseline and stable Postop Assessment: no apparent nausea or vomiting Anesthetic complications: no   No notable events documented.  Last Vitals:  Vitals:   06/21/21 0914 06/21/21 0923  BP:  (!) 145/55  Pulse:  75  Resp: (!) 24 16  Temp:    SpO2:  99%    Last Pain:  Vitals:   06/21/21 0903  TempSrc: Axillary  PainSc:                  [name]

## 2021-06-21 NOTE — Progress Notes (Signed)
  Echocardiogram Echocardiogram Transesophageal has been performed.  Trevor [name] 06/21/2021, 9:28 AM

## 2021-06-21 NOTE — Transfer of Care (Signed)
Immediate Anesthesia Transfer of Care Note  Patient: Trevor [name]  Procedure(s) Performed: TRANSESOPHAGEAL ECHOCARDIOGRAM (TEE) BUBBLE STUDY  Patient Location: PACU and Endoscopy Unit  Anesthesia Type:MAC  Level of Consciousness: awake and drowsy  Airway & Oxygen Therapy: Patient Spontanous Breathing  Post-op Assessment: Report given to RN and Post -op Vital signs reviewed and stable  Post vital signs: Reviewed and stable  Last Vitals:  Vitals Value Taken Time  BP 145/55 06/21/21 0923  Temp 36.9 C 06/21/21 0903  Pulse 73 06/21/21 0924  Resp 25 06/21/21 0924  SpO2 97 % 06/21/21 0924  Vitals shown include unvalidated device data.  Last Pain:  Vitals:   06/21/21 0903  TempSrc: Axillary  PainSc:          Complications: No notable events documented.

## 2021-06-21 NOTE — Consult Note (Signed)
Ref: [facility], [facility]   Subjective:  Here for TEE for right atrial mass.  Objective:  Vital Signs in the last 24 hours: Temp:  [97.7 F (36.5 C)-98.3 F (36.8 C)] 97.9 F (36.6 C) (07/06 0805) Pulse Rate:  [75-83] 83 (07/06 0805) Cardiac Rhythm: Normal sinus rhythm (07/06 0700) Resp:  [15-20] 20 (07/06 0805) BP: (129-170)/(55-68) 170/65 (07/06 0805) SpO2:  [95 %-100 %] 95 % (07/06 0805) Weight:  [79.4 kg] 79.4 kg (07/06 0805)  Physical Exam: BP Readings from Last 1 Encounters:  06/21/21 (!) 170/65     Wt Readings from Last 1 Encounters:  06/21/21 79.4 kg    Weight change:  Body mass index is 24.41 kg/m. HEENT: /AT, Eyes-Blue, Conjunctiva-Pink, Sclera-Non-icteric Neck: No JVD, No bruit, Trachea midline. Lungs:  Clear, Bilateral. Cardiac:  Regular rhythm, normal S1 and S2, no S3. II/VI systolic murmur. Abdomen:  Soft, non-tender. BS present. Extremities:  No edema present. No cyanosis. No clubbing. CNS: AxOx3, Cranial nerves grossly intact, moves all 4 extremities.  Skin: Warm and dry.   Intake/Output from previous day: 07/05 0701 - 07/06 0700 In: 240 [P.O.:240] Out: 575 [Urine:575]    Lab Results: BMET    Component Value Date/Time   NA 137 06/19/2021 0345   NA 139 06/18/2021 0323   NA 140 06/16/2021 0126   K 3.9 06/19/2021 0345   K 3.8 06/18/2021 0323   K 4.0 06/16/2021 0126   CL 109 06/19/2021 0345   CL 106 06/18/2021 0323   CL 108 06/16/2021 0126   CO2 20 (L) 06/19/2021 0345   CO2 21 (L) 06/18/2021 0323   CO2 22 06/16/2021 0126   GLUCOSE 93 06/19/2021 0345   GLUCOSE 137 (H) 06/18/2021 0323   GLUCOSE 202 (H) 06/16/2021 0126   BUN 11 06/19/2021 0345   BUN 9 06/18/2021 0323   BUN 15 06/16/2021 0126   CREATININE 0.94 06/19/2021 0345   CREATININE 0.87 06/18/2021 0323   CREATININE 1.06 06/16/2021 0126   CREATININE 0.84 10/08/2017 0858   CREATININE 0.87 07/08/2017 1643   CREATININE 1.09 12/22/2015 1508   CALCIUM 8.9 06/19/2021 0345   CALCIUM  8.8 (L) 06/18/2021 0323   CALCIUM 9.0 06/16/2021 0126   GFRNONAA >60 06/19/2021 0345   GFRNONAA >60 06/18/2021 0323   GFRNONAA >60 06/16/2021 0126   GFRNONAA 89 10/08/2017 0858   GFRNONAA 87 07/08/2017 1643   GFRNONAA 69 12/22/2015 1508   GFRAA >60 12/21/2017 0426   GFRAA 103 10/08/2017 0858   GFRAA >89 07/08/2017 1643   GFRAA 80 12/22/2015 1508   CBC    Component Value Date/Time   WBC 11.6 (H) 06/19/2021 0345   RBC 3.89 (L) 06/19/2021 0345   HGB 9.4 (L) 06/19/2021 0345   HCT 30.9 (L) 06/19/2021 0345   PLT 421 (H) 06/19/2021 0345   MCV 79.4 (L) 06/19/2021 0345   MCH 24.2 (L) 06/19/2021 0345   MCHC 30.4 06/19/2021 0345   RDW 18.4 (H) 06/19/2021 0345   LYMPHSABS 2.3 06/16/2021 1317   MONOABS 1.4 (H) 06/16/2021 1317   EOSABS 0.2 06/16/2021 1317   BASOSABS 0.1 06/16/2021 1317   HEPATIC Function Panel Recent Labs    06/15/21 0734 06/16/21 0126  PROT 6.7 5.7*   HEMOGLOBIN A1C No components found for: HGA1C,  MPG CARDIAC ENZYMES Lab Results  Component Value Date   CKTOTAL 24 (L) 06/15/2021   TROPONINI <0.03 06/13/2015   BNP No results for input(s): PROBNP in the last 8760 hours. TSH Recent Labs  06/15/21 1550  TSH 1.231   CHOLESTEROL No results for input(s): CHOL in the last 8760 hours.  Scheduled Meds:  [MAR Hold] divalproex  500 mg Oral BID   [MAR Hold] enoxaparin (LOVENOX) injection  40 mg Subcutaneous Q24H   [MAR Hold] finasteride  5 mg Oral QPM   [MAR Hold] fluticasone furoate-vilanterol  1 puff Inhalation Daily   [MAR Hold] guaiFENesin  600 mg Oral BID   [MAR Hold] insulin aspart  0-9 Units Subcutaneous TID WC   [MAR Hold] multivitamin with minerals  1 tablet Oral Daily   [MAR Hold] rosuvastatin  10 mg Oral QHS   Continuous Infusions:  sodium chloride 20 mL/hr at 06/21/21 0821   PRN Meds:.[MAR Hold] acetaminophen **OR** [MAR Hold] acetaminophen, [MAR Hold] albuterol, [MAR Hold] diphenhydrAMINE-zinc acetate, [MAR Hold] ipratropium-albuterol, [MAR  Hold] polyvinyl alcohol  Assessment/Plan:  Right atrial mass r/o myxoma Hepatic mass HTN Type 2 DM COPD Tobacco use disorder Seizure disorder HLD  Plan: TEE today. Patient understood procedure, risks and alternatives.   LOS: 5 days   Time spent including chart review, lab review, examination, discussion with patient :  min   Dixie Dials  MD  06/21/2021, 8:30 AM

## 2021-06-21 NOTE — Plan of Care (Signed)
  Problem: Elimination: Goal: Will not experience complications related to bowel motility Outcome: Progressing   Problem: Elimination: Goal: Will not experience complications related to urinary retention Outcome: Progressing   Problem: Pain Managment: Goal: General experience of comfort will improve Outcome: Progressing   Problem: Safety: Goal: Ability to remain free from injury will improve Outcome: Progressing   

## 2021-06-21 NOTE — Plan of Care (Signed)
  Problem: Clinical Measurements: Goal: Respiratory complications will improve Outcome: Progressing   Problem: Nutrition: Goal: Adequate nutrition will be maintained Outcome: Progressing   Problem: Coping: Goal: Level of anxiety will decrease Outcome: Progressing   Problem: Elimination: Goal: Will not experience complications related to urinary retention Outcome: Progressing   Problem: Pain Managment: Goal: General experience of comfort will improve Outcome: Progressing

## 2021-06-21 NOTE — Progress Notes (Addendum)
Subjective:  O/N Events: He had some soreness surrounding his liver biopsy site which improved with Tylenol.  Mr. Murgia went for a TEE this morning. He endorses feeling sleepy and hungry after the procedure. He shared with [place] today that he has been having intermittent dizziness for quite a while. He is amenable to staying at the hospital for one more night to be evaluated by CT Surgery.  Objective:  Vital signs in last 24 hours: Vitals:   06/21/21 0805 06/21/21 0903 06/21/21 0914 06/21/21 0923  BP: (!) 170/65 (!) 114/31  (!) 145/55  Pulse: 83   75  Resp: 20 (!) 29 (!) 24 16  Temp: 97.9 F (36.6 C) 98.4 F (36.9 C)    TempSrc: Oral Axillary    SpO2: 95% 95%  99%  Weight: 79.4 kg     Height: 5\' 11"  (1.803 m)       Constitutional: tired-appearing, elderly male, sitting in bed, in no acute distress Cardiovascular: regular rate and rhythm, slightly muffled heart sounds, no pitting edema on BLEs Pulmonary/Chest: normal work of breathing on room air,upper and lower lung fields CTAB, poor respiratory effort Abdominal: soft, non-tender, non-distended Psych: slightly depressed affect, appropriate given circumstances  CBC Latest Ref Rng & Units 06/19/2021 06/18/2021 06/17/2021  WBC 4.0 - 10.5 K/uL 11.6(H) 11.8(H) 11.8(H)  Hemoglobin 13.0 - 17.0 g/dL 9.4(L) 9.2(L) 9.0(L)  Hematocrit 39.0 - 52.0 % 30.9(L) 30.1(L) 29.5(L)  Platelets 150 - 400 K/uL 421(H) 447(H) 460(H)    BMP Latest Ref Rng & Units 06/19/2021 06/18/2021 06/16/2021  Glucose 70 - 99 mg/dL 93 137(H) 202(H)  BUN 8 - 23 mg/dL 11 9 15   Creatinine 0.61 - 1.24 mg/dL 0.94 0.87 1.06  BUN/Creat Ratio 6 - 22 (calc) - - -  Sodium 135 - 145 mmol/L 137 139 140  Potassium 3.5 - 5.1 mmol/L 3.9 3.8 4.0  Chloride 98 - 111 mmol/L 109 106 108  CO2 22 - 32 mmol/L 20(L) 21(L) 22  Calcium 8.9 - 10.3 mg/dL 8.9 8.8(L) 9.0    Assessment/Plan:  Principal Problem:   Pneumonia Active Problems:   Acute kidney injury (HCC)   Normocytic anemia   Right  atrial mass   Liver mass   Malnutrition of moderate degree   Mr. Kruckenberg is a 75 year old gentleman with past medical history of hypertension, diabetes, BPH, and COPD who was admitted for community-acquired pneumonia, and is currently being worked up for a liver mass.   Hepatic Mass, Cardiac Mass likely due to new malignancy Found to have a 14.4 x 9.4 x 9.4 cm hepatic mass with satellite lesions on hepatic MRI in the setting of unintentional weight loss and chronic iron deficiency anemia. LDH 453, AFP elevated to 646, normal CEA and CA 19-9. S/p Liver biopsy, results pending. At this time, high risk for malignancy ([place] vs. Cholangiocarcinoma). TEE performed today, to better visualize RA mass. Of note, he does endorse episodes of intermittent dizziness, orthopnea and leg swelling which could be related to mass-induced obstruction. At this time it is unclear if cardiac mass is malignant or benign myxoma, but we suspect metastatic spread from the current liver mass given RA location. Cardiothoracic surgery has been consulted today.  - outpatient follow up with cancer center - will follow up liver biopsy pathology  Iron Deficiency Anemia (Stable) Hgb 9.4 and MCV 79.4 on 7/4 stable from prior. S/p Ferraheme 7/3. No concerns for active bleed at this time.  Community Acquired Pneumonia (Resolved) He has completed antibiotic course x5  days for CAP. He remains afebrile.    Prior to Admission Living Arrangement: Home Anticipated Discharge Location: Home Barriers to Discharge: Continue medical work up Dispo: Anticipated discharge on 7/6 following the TEE if it goes well   Ok Edwards, Medical Student 06/21/2021, 10:53 AM Pager: [phone] After 5pm on weekdays and 1pm on weekends: On Call pager [phone]

## 2021-06-21 NOTE — Consult Note (Addendum)
[address]       [place] [address]             [phone]        Linzy L [facility] Medical Record #[id number] Date of Birth: [birth year]  Referring: No ref. provider found Primary Care: [facility] Primary Cardiologist:None  Chief Complaint:    Chief Complaint  Patient presents with   Diarrhea   Cough    History of Present Illness:    The patient is a 75 year old male we are asked to see in cardiothoracic surgical consultation due to findings of a large right atrial mass.  The patient has multiple cardiopulmonary risk factors including COPD, diabetes, hypercholesterolemia and hypertension.  He presented to the emergency department on 06/15/2021 with chief complaints of cough, chest congestion and leukocytosis.  He describes approximately 2-week history of cough, congestive with subjective fevers.  He was admitted with presumptive viral/bacterial pneumonia to the medicine service.  He was started on ceftriaxone and azithromycin.  He has had extensive diagnostic work-up including CT of the abdomen /pelvis which showed an ill-defined masslike heterogeneous lesion in the anterior right lobe of the liver.  There is some concern for malignancy as related to this.  He has undergone ultrasound-guided liver biopsy and pathology results are currently pending.  He also reported that he had a previous recent echocardiogram at the [facility] which showed a cardiac mass.  He was noted to have a acute kidney injury with elevation of his creatinine to 1.32 from a normal baseline.  This route did raise all of over time with hydration.  He was also noted to have a normocytic anemia on admission with a hemoglobin of 10.6 and MCV of 81.4.  He reportedly has had a recent EGD/colonoscopy done at the [place] reportedly unremarkable.  He was seen in cardiology consultation by Dr. Terrence Dupont and had subsequently been scheduled for a TEE.  This has confirmed findings of the  cardiac mass .  Please see full report listed below.  In addition to the right atrial mass findings included moderate mitral regurgitation with preserved LV systolic function and severe aortic atherosclerosis.    Current Activity/ Functional Status: Patient was independent with mobility/ambulation, transfers, ADL's, IADL's.   Zubrod Score: At the time of surgery this patient's most appropriate activity status/level should be described as: []     0    Normal activity, no symptoms []     1    Restricted in physical strenuous activity but ambulatory, able to do out light work [x]     2    Ambulatory and capable of self care, unable to do work activities, up and about                 more than 50%  Of the time                            []     3    Only limited self care, in bed greater than 50% of waking hours []     4    Completely disabled, no self care, confined to bed or chair []     5    Moribund  Past Medical History:  Diagnosis Date   BPH (benign prostatic hyperplasia)    COPD (chronic obstructive pulmonary disease) ([place])    COPD (chronic obstructive pulmonary disease) ([place])    COVID-19    Diabetes  mellitus without complication (HCC)    GERD (gastroesophageal reflux disease)    Hypercholesterolemia    Hypertension    Seizures ([place])    Type II or unspecified type diabetes mellitus without mention of complication, not stated as uncontrolled     Past Surgical History:  Procedure Laterality Date   HAND SURGERY Left 1953   IR US GUIDE BX ASP/DRAIN  06/20/2021   KNEE ARTHROSCOPY Left 1998    Social History   Tobacco Use  Smoking Status Former   Pack years: 0.00   Types: Cigarettes   Quit date: 12/17/2013   Years since quitting: 7.5  Smokeless Tobacco Never    Social History   Substance and Sexual Activity  Alcohol Use No   Alcohol/week: 0.0 standard drinks     Allergies  Allergen Reactions   Atorvastatin Other (See Comments)    Muscle pain   Latex Hives and Rash    Penicillin G Rash   Penicillins Rash    Current Facility-Administered Medications  Medication Dose Route Frequency Provider Last Rate Last Admin   acetaminophen (TYLENOL) tablet 650 mg  650 mg Oral Q6H PRN Maudie Mercury, MD   650 mg at 06/21/21 1041   Or   acetaminophen (TYLENOL) suppository 650 mg  650 mg Rectal Q6H PRN Maudie Mercury, MD       albuterol (VENTOLIN HFA) 108 (90 Base) MCG/ACT inhaler 1-2 puff  1-2 puff Inhalation Q6H PRN Maudie Mercury, MD   2 puff at 06/16/21 1927   diphenhydrAMINE-zinc acetate (BENADRYL) 2-0.1 % cream   Topical BID PRN Maudie Mercury, MD       divalproex (DEPAKOTE ER) 24 hr tablet 500 mg  500 mg Oral BID Maudie Mercury, MD   500 mg at 06/21/21 1037   enoxaparin (LOVENOX) injection 40 mg  40 mg Subcutaneous Q24H Monia Sabal, PA-C   40 mg at 06/21/21 1038   finasteride (PROSCAR) tablet 5 mg  5 mg Oral QPM Maudie Mercury, MD   5 mg at 06/20/21 1708   fluticasone furoate-vilanterol (BREO ELLIPTA) 100-25 MCG/INH 1 puff  1 puff Inhalation Daily Maudie Mercury, MD   1 puff at 06/21/21 0728   guaiFENesin (MUCINEX) 12 hr tablet 600 mg  600 mg Oral BID Maudie Mercury, MD   600 mg at 06/21/21 1037   insulin aspart (novoLOG) injection 0-9 Units  0-9 Units Subcutaneous TID WC Maudie Mercury, MD   1 Units at 06/20/21 1152   ipratropium-albuterol (DUONEB) 0.5-2.5 (3) MG/3ML nebulizer solution 3 mL  3 mL Nebulization Q6H PRN Maudie Mercury, MD       multivitamin with minerals tablet 1 tablet  1 tablet Oral Daily Axel Filler, MD   1 tablet at 06/21/21 1037   polyvinyl alcohol (LIQUIFILM TEARS) 1.4 % ophthalmic solution 1 drop  1 drop Both Eyes PRN Axel Filler, MD   1 drop at 06/19/21 2045   rosuvastatin (CRESTOR) tablet 10 mg  10 mg Oral Baird Lyons, MD   10 mg at 06/20/21 2045    Medications Prior to Admission  Medication Sig Dispense Refill Last Dose   albuterol (VENTOLIN HFA) 108 (90 Base) MCG/ACT inhaler Inhale 1-2 puffs into  the lungs every 6 (six) hours as needed for wheezing or shortness of breath. (Patient taking differently: Inhale 2 puffs into the lungs every 6 (six) hours as needed for wheezing or shortness of breath.) 18 g 0 Past Week   amLODipine (NORVASC) 5 MG tablet Take 5 mg by mouth daily.  06/14/2021   ascorbic acid (VITAMIN C) 250 MG tablet Take 250 mg by mouth daily.   06/14/2021   aspirin 81 MG chewable tablet Chew 81 mg by mouth daily.   06/14/2021   Cholecalciferol (VITAMIN D3) 125 MCG (5000 UT) CAPS Take 5,000 Units by mouth daily.   06/14/2021   divalproex (DEPAKOTE ER) 500 MG 24 hr tablet Take 1 tablet (500 mg total) by mouth 2 (two) times daily. (Patient taking differently: Take 500 mg by mouth in the morning.) 180 tablet 1 06/14/2021 at am   empagliflozin (JARDIANCE) 25 MG TABS tablet Take 12.5 mg by mouth daily.   06/14/2021   ferrous sulfate 325 (65 FE) MG tablet Take 325 mg by mouth daily with breakfast.   06/14/2021   finasteride (PROSCAR) 5 MG tablet Take 5 mg by mouth daily after supper.   06/14/2021 at pm   fluticasone (FLONASE) 50 MCG/ACT nasal spray Place 2 sprays into both nostrils daily. (Patient taking differently: Place 2 sprays into both nostrils daily as needed for allergies.) 16 g 0 Past Month   furosemide (LASIX) 20 MG tablet Take 1 tablet (20 mg total) by mouth daily. 5 tablet 0 08/03/5630   Garlic 4970 MG CAPS Take 1,000 mg by mouth daily.   06/14/2021   glipiZIDE (GLUCOTROL) 10 MG tablet Take 10 mg by mouth 2 (two) times daily before a meal.   06/14/2021   ipratropium-albuterol (DUONEB) 0.5-2.5 (3) MG/3ML SOLN Take 3 mLs by nebulization every 6 (six) hours as needed (wheezing, shortness of breath). 360 mL 0 Past Week   lisinopril (PRINIVIL,ZESTRIL) 40 MG tablet Take 40 mg by mouth daily.   06/14/2021   loratadine (CLARITIN) 10 MG tablet Take 10 mg by mouth daily.   06/14/2021   Magnesium Oxide 420 MG TABS Take 420 mg by mouth 2 (two) times daily.   06/14/2021   metFORMIN (GLUCOPHAGE) 850 MG  tablet Take 850 mg by mouth 3 (three) times daily.   06/14/2021   metoprolol tartrate (LOPRESSOR) 50 MG tablet Take 25 mg by mouth 2 (two) times daily. Hold dose for sbp less than 100 or pulse less than 50   06/14/2021 at 1915   naproxen sodium (ALEVE) 220 MG tablet Take 660 mg by mouth 2 (two) times daily as needed (pain/headache).   Past Week   Omega-3 Fatty Acids (FISH OIL) 1000 MG CAPS Take 1,000 mg by mouth daily.   06/14/2021   omeprazole (PRILOSEC) 20 MG capsule Take 20 mg by mouth daily.   06/14/2021   OVER THE COUNTER MEDICATION Apply 1 application topically as needed (itching). [place] anti-itch cream   Past Week   prochlorperazine (COMPAZINE) 5 MG tablet Take 5 mg by mouth 2 (two) times daily. Lunch and Bedtime   06/14/2021   rosuvastatin (CRESTOR) 20 MG tablet Take 10 mg by mouth daily.   06/14/2021   fluticasone furoate-vilanterol (BREO ELLIPTA) 100-25 MCG/INH AEPB Inhale 1 puff into the lungs daily. (Patient not taking: Reported on 06/16/2021) 28 each 0 Not Taking   metFORMIN (GLUCOPHAGE XR) 500 MG 24 hr tablet Take 2 tablets 2 x / day for Diabetes (Patient not taking: Reported on 06/16/2021) 360 tablet 3 Not Taking   rosuvastatin (CRESTOR) 40 MG tablet Take 40 mg by mouth daily. (Patient not taking: Reported on 06/16/2021)   Not Taking    Family History  Problem Relation Age of Onset   CVA Mother    Thyroid disease Mother    Heart disease Father  Diabetes Sister    Heart disease Sister    Diabetes Brother    Heart disease Brother    Epilepsy Brother    Schizophrenia Brother      Review of Systems:   Review of Systems  Constitutional:  Positive for malaise/fatigue and weight loss. Negative for chills, diaphoresis and fever.  HENT:  Positive for hearing loss, sore throat and tinnitus. Negative for congestion, ear discharge, ear pain, nosebleeds and sinus pain.   Eyes:  Negative for blurred vision, double vision, photophobia, pain, discharge and redness.       Itching in eyes   Respiratory:  Positive for cough, sputum production, shortness of breath and wheezing. Negative for hemoptysis and stridor.   Cardiovascular:  Positive for palpitations, orthopnea, claudication and leg swelling. Negative for chest pain and PND.  Gastrointestinal:  Positive for diarrhea and heartburn. Negative for abdominal pain, blood in stool, constipation, melena, nausea and vomiting.  Genitourinary: Negative.   Musculoskeletal:  Positive for back pain and joint pain.  Skin:  Positive for itching and rash.  Neurological:  Positive for dizziness, tingling, sensory change, seizures and weakness. Negative for speech change, focal weakness and headaches.       No siezues in 11 years but had several at age 50  Endo/Heme/Allergies:  Negative for environmental allergies and polydipsia. Bruises/bleeds easily.  Psychiatric/Behavioral:  Positive for hallucinations and memory loss. Negative for depression, substance abuse and suicidal ideas. The patient has insomnia. The patient is not nervous/anxious.          Physical Exam: BP (!) 145/55   Pulse 75   Temp 98.4 F (36.9 C) (Axillary)   Resp 16   Ht 5\' 11"  (1.803 m)   Wt 79.4 kg   SpO2 99%   BMI 24.41 kg/m    Physical Exam  Constitutional: He appears chronically ill.  HENT:  Mouth/Throat: Oropharynx is clear. Pharynx is normal.  Eyes: Pupils are equal, round, and reactive to light. Conjunctivae are normal.  Neck: Thyroid normal. No JVD present. No neck adenopathy. No thyromegaly present.  Cardiovascular: Regular rhythm, S1 normal, S2 normal and normal heart sounds. Exam reveals no gallop.  No murmur heard. Pulses:      Radial pulses are 1+ on the right side and 1+ on the left side.       Dorsalis pedis pulses are 1+ on the right side and 1+ on the left side.       Posterior tibial pulses are 0 on the right side and 0 on the left side.  + right carotid bruit  Pulmonary/Chest: Breath sounds normal. He has no wheezes. He has no rales.  He exhibits no tenderness.  Abdominal: Soft. He exhibits distension. He exhibits no mass. There is no hepatomegaly. There is abdominal tenderness.  Musculoskeletal:        General: Edema present.     Cervical back: Normal range of motion and neck supple.  Neurological: He is alert and oriented to person, place, and time. He has normal motor skills.  Skin: Skin is warm and dry. No rash noted. No cyanosis. No jaundice or pallor. Nails show no clubbing.    Diagnostic Studies & Laboratory data:     Recent Radiology Findings:   CT CHEST W CONTRAST  Result Date: 06/19/2021 CLINICAL DATA:  75 year old with hepatic mass and weight loss. Malignancy evaluation. EXAM: CT CHEST WITH CONTRAST TECHNIQUE: Multidetector CT imaging of the chest was performed during intravenous contrast administration. CONTRAST:  48mL OMNIPAQUE IOHEXOL 300  MG/ML  SOLN COMPARISON:  CT abdomen 06/15/2021.  MRI abdomen 06/17/2021 FINDINGS: Cardiovascular: Atherosclerotic calcifications involving the thoracic aorta without enlargement. Coronary artery calcifications. Heart size is normal without significant pericardial fluid. Mediastinum/Nodes: Tiny low-density in the right thyroid lobe. No suspicious thyroid nodules. Mildly prominent nodal tissue at the AP window measuring 1.1 cm in the short axis on sequence 3, image 63. Subcarinal tissue is mildly prominent measuring 1.1 cm in the short axis on image 83, sequence 3. No significant hilar lymph node enlargement. No axillary lymph node enlargement. Small lymph nodes along the distal esophagus are nonspecific. Mildly prominent lymph node in the right epicardial fat on sequence 3, image 118 measures 9 mm in the short axis. Lungs/Pleura: Trachea and mainstem bronchi are patent. Small patchy densities in the right lower lobe. Some of these lung densities have a tree-in-bud configuration. Similar small nodular and tree-in-bud configurations in the left lower lobe. Small amount of posterior  atelectasis or consolidation in the right lower lobe. 5 mm peripheral nodule in the right lower lobe on sequence 5 image 141. No pleural effusions. Upper Abdomen: Again noted is a poorly defined lesion involving the right hepatic lobe and portions of medial left hepatic lobe. Main portal venous system is patent but there could be involvement or displacement of the right portal venous system. Small lymph nodes in the upper abdomen and porta hepatis region. Musculoskeletal: No acute bone abnormality. IMPRESSION: 1. Patchy small densities in both lower lobes. Some of these densities have a tree-in-bud configuration and findings are suggestive for an infectious or inflammatory process. Small amount of consolidation in the medial right lower lobe. 2. No clear evidence for neoplasm or metastatic disease within the chest. Borderline sized mediastinal lymph nodes are indeterminate. In addition, there is an indeterminate 5 mm nodule at the right lung base. Recommend attention to these areas on follow-up imaging. 3. Large infiltrative lesion involving the liver. This is compatible with a neoplastic process and refer to the recent MRI examination. 4.  Aortic Atherosclerosis (ICD10-I70.0). Electronically Signed   By: Markus Daft M.D.   On: 06/19/2021 15:16   IR US Guide Bx Asp/Drain  Result Date: 06/20/2021 INDICATION: Right liver mass EXAM: Ultrasound-guided biopsy of right liver mass MEDICATIONS: None. ANESTHESIA/SEDATION: Moderate (conscious) sedation was employed during this procedure. A total of Versed 1.5 mg and Fentanyl 50 mcg was administered intravenously. Moderate Sedation Time: 15 minutes. The patient's level of consciousness and vital signs were monitored continuously by radiology nursing throughout the procedure under my direct supervision. COMPLICATIONS: None immediate. PROCEDURE: Informed written consent was obtained from the patient after a thorough discussion of the procedural risks, benefits and  alternatives. All questions were addressed. Maximal Sterile Barrier Technique was utilized including caps, mask, sterile gowns, sterile gloves, sterile drape, hand hygiene and skin antiseptic. A timeout was performed prior to the initiation of the procedure. Patient position supine on the ultrasound table. Right upper quadrant skin prepped and draped in usual sterile fashion. Following local lidocaine administration, 17 gauge introducer needle was advanced into the right liver lesion, and 4- 18 gauge cores were obtained utilizing continuous ultrasound guidance. Gelfoam slurry was administered through the introducer needle at the biopsy site. Samples were sent to pathology in formalin. Needle removed and hemostasis achieved with 5 minutes of manual compression. IMPRESSION: Ultrasound-guided biopsy of right liver mass as above. Electronically Signed   By: Miachel Roux M.D.   On: 06/20/2021 13:07      INDICATIONS:   The patient  is 75 years old male with right atrial mass without lentiginous skin lesions.   PROCEDURE:  Informed consent was discussed including risks, benefits and alternatives for the procedure.  Risks include, but are not limited to, cough, sore throat, vomiting, nausea, somnolence, esophageal and stomach trauma or perforation, bleeding, low blood pressure, aspiration, pneumonia, infection, trauma to the teeth and death.     Patient was given sedation.  The oropharynx was anesthetized with topical lidocaine.  The transesophageal probe was inserted in the esophagus and stomach and multiple views were obtained.  Agitated saline was used after the transesophageal probe was removed from the body.  The patient was kept under observation until the patient left the procedure room.  The patient left the procedure room in stable condition.   COMPLICATIONS:  There were no immediate complications.   FINDINGS:   LEFT VENTRICLE: The left ventricle is normal in structure and function.  Wall motion is  normal.  No thrombus or masses seen in the left ventricle.   RIGHT VENTRICLE:  The right ventricle is normal in structure and function without any thrombus or masses.     LEFT ATRIUM:  The left atrium is normal without any thrombus or masses.   LEFT ATRIAL APPENDAGE:  The left atrial appendage is free of any thrombus or masses.   RIGHT ATRIUM:  The right atrium has 6.0 x 4 cm irregular mass covering more than 80 % of the cavity.     ATRIAL SEPTUM:  The atrial septum is normal without any ASD or PFO.   MITRAL VALVE:  The mitral valve is normal in structure and function with mild to moderate multiple jets of MR, no masses, stenosis or vegetations.   TRICUSPID VALVE:  The tricuspid valve is normal in structure and function with mild regurgitation, no masses, stenosis or vegetations.   AORTIC VALVE:  The aortic valve is Sclerotic with mild calcification, normal function with minimal regurgitation, no masses, stenosis or vegetations.   PULMONIC VALVE:  The pulmonic valve is normal in structure and function with minimal regurgitation, masses, stenosis or vegetations.   AORTIC ARCH, ASCENDING AND DESCENDING AORTA:  The aorta had significant atherosclerosis in the ascending and descending aorta.  The aortic arch was normal.    Superior Vena Cava : No thrombus or catheter.    Pulmonary Veins: Visible.    Pulmonary artery: visible and normal.     IMPRESSION:   1.Very large mobile right atrial mass. 2. Minimal AI, PI and TR. 3. Moderate MR. 4. Preserved LV systolic function. 5. Severe aortic atherosclerosis.   RECOMMENDATIONS:    CVTS consult.       I have independently reviewed the above radiologic studies and discussed with the patient   Recent Lab Findings: Lab Results  Component Value Date   WBC 11.6 (H) 06/19/2021   HGB 9.4 (L) 06/19/2021   HCT 30.9 (L) 06/19/2021   PLT 421 (H) 06/19/2021   GLUCOSE 93 06/19/2021   CHOL 193 10/08/2017   TRIG 310 (H) 10/08/2017   HDL 28  (L) 10/08/2017   LDLCALC 121 (H) 10/08/2017   ALT 46 (H) 06/16/2021   AST 48 (H) 06/16/2021   NA 137 06/19/2021   K 3.9 06/19/2021   CL 109 06/19/2021   CREATININE 0.94 06/19/2021   BUN 11 06/19/2021   CO2 20 (L) 06/19/2021   TSH 1.231 06/15/2021   INR 1.22 07/05/2010   HGBA1C 6.7 (H) 06/15/2021      Assessment / Plan: Large  right atrial mass in the setting of possible hepatic carcinoma.  He has undergone liver biopsy and results of pathology are currently pending.  May need to consider cardiac MRI to better image the mass as well as potential coronary artery disease with significant multiple risk factors.  He has also noted to have a right carotid bruit so duplex exam will be obtained.    Current pneumonia improving since admission, no recent fevers, has completed a 5-day course.  Significant tobacco abuse history x50 years, quit 11 years ago.  Positive COPD  Type 2 diabetes mellitus times approximately 15 years.  On 3 medication regimen with most recent hemoglobin A1c 6.7.  Has been poorly controlled in the past with highest level noted in 2018 of 12.7.  Acute renal insufficiency resolved  Iron deficiency anemia  Hyperlipidemia  GERD  Hypertension  History of seizure disorder-no seizures times approximately 11 years on current medication regimen.   Plan: The patient and relevant studies will be reviewed by this surgeon for consideration of surgical intervention of the right atrial mass.  I  spent 55 minutes counseling the patient face to face.   John Giovanni, PA-C  06/21/2021 10:43 AM   Pt seen and examined; chart and films personally reviewed. Awaiting liver bx results; anticipate need for multidisciplinary care coordination to prioritize his various care needs. No need for urgent surgery on the RA mass. Lindsy Cerullo Z. Orvan Seen, [place]

## 2021-06-21 NOTE — CV Procedure (Signed)
INDICATIONS:   The patient is 75 years old male with right atrial mass without lentiginous skin lesions.  PROCEDURE:  Informed consent was discussed including risks, benefits and alternatives for the procedure.  Risks include, but are not limited to, cough, sore throat, vomiting, nausea, somnolence, esophageal and stomach trauma or perforation, bleeding, low blood pressure, aspiration, pneumonia, infection, trauma to the teeth and death.    Patient was given sedation.  The oropharynx was anesthetized with topical lidocaine.  The transesophageal probe was inserted in the esophagus and stomach and multiple views were obtained.  Agitated saline was used after the transesophageal probe was removed from the body.  The patient was kept under observation until the patient left the procedure room.  The patient left the procedure room in stable condition.   COMPLICATIONS:  There were no immediate complications.  FINDINGS:  LEFT VENTRICLE: The left ventricle is normal in structure and function.  Wall motion is normal.  No thrombus or masses seen in the left ventricle.  RIGHT VENTRICLE:  The right ventricle is normal in structure and function without any thrombus or masses.    LEFT ATRIUM:  The left atrium is normal without any thrombus or masses.   LEFT ATRIAL APPENDAGE:  The left atrial appendage is free of any thrombus or masses.  RIGHT ATRIUM:  The right atrium has 6.0 x 4 cm irregular mass covering more than 80 % of the cavity.    ATRIAL SEPTUM:  The atrial septum is normal without any ASD or PFO.  MITRAL VALVE:  The mitral valve is normal in structure and function with mild to moderate multiple jets of MR, no masses, stenosis or vegetations.  TRICUSPID VALVE:  The tricuspid valve is normal in structure and function with mild regurgitation, no masses, stenosis or vegetations.  AORTIC VALVE:  The aortic valve is Sclerotic with mild calcification, normal function with minimal regurgitation, no  masses, stenosis or vegetations.  PULMONIC VALVE:  The pulmonic valve is normal in structure and function with minimal regurgitation, masses, stenosis or vegetations.  AORTIC ARCH, ASCENDING AND DESCENDING AORTA:  The aorta had significant atherosclerosis in the ascending and descending aorta.  The aortic arch was normal.   Superior Vena Cava : No thrombus or catheter.   Pulmonary Veins: Visible.   Pulmonary artery: visible and normal.   IMPRESSION:   1.Very large mobile right atrial mass. 2. Minimal AI, PI and TR.  3. Moderate MR. 4. Preserved LV systolic function. 5. Severe aortic atherosclerosis.  RECOMMENDATIONS:    CVTS consult.

## 2021-06-21 NOTE — Discharge Instructions (Addendum)
Hi Mr. Crewe, [place] were admitted to [facility] for the following medical problems:   1) Community-Acquired Pneumonia: You received 5 days of antibiotic therapy to treat the pneumonia, which improved your shortness of breath and congestion.   2) Liver Mass: Imaging of your abdomen showed a large mass in your liver, which was concerning for cancer. You underwent a liver biopsy, and the results for the biopsy shows diagnosis hepatocellular carcinoma. We have reached out to the [facility] for further work-up and management of the cancer. They will give you a call to inform you of a day and time for a follow-up appointment. If you do not hear from them within the next, please call them at [phone].   3) Heart Mass: Your wife shares with [place] that a heart echocardiogram done at the [place] showed a mass in your heart. As we could not access those records, we repeated that imaging and it confirmed that there is a mass that is occupying about 80% of your right atrium of your heart. We reached out to the cardiac surgery team to discuss these results, and after examining you they felt that you did not need heart surgery to remove or sample the mass during this hospital stay. You are encouraged to keep your appointment for the cardiac MRI.   4) Iron Deficiency Anemia: Your Iron levels were low so you were given an iron transfusion. We recommend you follow up with your primary care doctor to monitor your anemia.     Please call 911 or go to your nearest emergency room if you develop shortness of breath, chest pain, or difficulty breathing.   Thank you for letting us take care of you and good luck!

## 2021-06-21 NOTE — Progress Notes (Signed)
Subjective:  Patient denies any chest pain or shortness of breath tolerated TEE earlier this morning noted to have very large mobile right atrial mass.  CVTS consult has been called in for further evaluation and management possibly will need cardiac MRI.  Awaiting liver biopsy results  Objective:  Vital Signs in the last 24 hours: Temp:  [97.7 F (36.5 C)-98.4 F (36.9 C)] 98.4 F (36.9 C) (07/06 0903) Pulse Rate:  [75-83] 75 (07/06 0923) Resp:  [16-29] 16 (07/06 0923) BP: (114-170)/(31-68) 145/55 (07/06 0923) SpO2:  [95 %-99 %] 99 % (07/06 0923) Weight:  [79.4 kg] 79.4 kg (07/06 0805)  Intake/Output from previous day: 07/05 0701 - 07/06 0700 In: 240 [P.O.:240] Out: 575 [Urine:575] Intake/Output from this shift: Total I/O In: 200 [I.V.:200] Out: 0   Physical Exam: Exam unchanged  Lab Results: Recent Labs    06/19/21 0345  WBC 11.6*  HGB 9.4*  PLT 421*   Recent Labs    06/19/21 0345  NA 137  K 3.9  CL 109  CO2 20*  GLUCOSE 93  BUN 11  CREATININE 0.94   No results for input(s): TROPONINI in the last 72 hours.  Invalid input(s): CK, MB Hepatic Function Panel No results for input(s): PROT, ALBUMIN, AST, ALT, ALKPHOS, BILITOT, BILIDIR, IBILI in the last 72 hours. No results for input(s): CHOL in the last 72 hours. No results for input(s): PROTIME in the last 72 hours.  Imaging: CT CHEST W CONTRAST  Result Date: 06/19/2021 CLINICAL DATA:  75 year old with hepatic mass and weight loss. Malignancy evaluation. EXAM: CT CHEST WITH CONTRAST TECHNIQUE: Multidetector CT imaging of the chest was performed during intravenous contrast administration. CONTRAST:  39mL OMNIPAQUE IOHEXOL 300 MG/ML  SOLN COMPARISON:  CT abdomen 06/15/2021.  MRI abdomen 06/17/2021 FINDINGS: Cardiovascular: Atherosclerotic calcifications involving the thoracic aorta without enlargement. Coronary artery calcifications. Heart size is normal without significant pericardial fluid. Mediastinum/Nodes: Tiny  low-density in the right thyroid lobe. No suspicious thyroid nodules. Mildly prominent nodal tissue at the AP window measuring 1.1 cm in the short axis on sequence 3, image 63. Subcarinal tissue is mildly prominent measuring 1.1 cm in the short axis on image 83, sequence 3. No significant hilar lymph node enlargement. No axillary lymph node enlargement. Small lymph nodes along the distal esophagus are nonspecific. Mildly prominent lymph node in the right epicardial fat on sequence 3, image 118 measures 9 mm in the short axis. Lungs/Pleura: Trachea and mainstem bronchi are patent. Small patchy densities in the right lower lobe. Some of these lung densities have a tree-in-bud configuration. Similar small nodular and tree-in-bud configurations in the left lower lobe. Small amount of posterior atelectasis or consolidation in the right lower lobe. 5 mm peripheral nodule in the right lower lobe on sequence 5 image 141. No pleural effusions. Upper Abdomen: Again noted is a poorly defined lesion involving the right hepatic lobe and portions of medial left hepatic lobe. Main portal venous system is patent but there could be involvement or displacement of the right portal venous system. Small lymph nodes in the upper abdomen and porta hepatis region. Musculoskeletal: No acute bone abnormality. IMPRESSION: 1. Patchy small densities in both lower lobes. Some of these densities have a tree-in-bud configuration and findings are suggestive for an infectious or inflammatory process. Small amount of consolidation in the medial right lower lobe. 2. No clear evidence for neoplasm or metastatic disease within the chest. Borderline sized mediastinal lymph nodes are indeterminate. In addition, there is an indeterminate 5 mm  nodule at the right lung base. Recommend attention to these areas on follow-up imaging. 3. Large infiltrative lesion involving the liver. This is compatible with a neoplastic process and refer to the recent MRI  examination. 4.  Aortic Atherosclerosis (ICD10-I70.0). Electronically Signed   By: Markus Daft M.D.   On: 06/19/2021 15:16   IR US Guide Bx Asp/Drain  Result Date: 06/20/2021 INDICATION: Right liver mass EXAM: Ultrasound-guided biopsy of right liver mass MEDICATIONS: None. ANESTHESIA/SEDATION: Moderate (conscious) sedation was employed during this procedure. A total of Versed 1.5 mg and Fentanyl 50 mcg was administered intravenously. Moderate Sedation Time: 15 minutes. The patient's level of consciousness and vital signs were monitored continuously by radiology nursing throughout the procedure under my direct supervision. COMPLICATIONS: None immediate. PROCEDURE: Informed written consent was obtained from the patient after a thorough discussion of the procedural risks, benefits and alternatives. All questions were addressed. Maximal Sterile Barrier Technique was utilized including caps, mask, sterile gowns, sterile gloves, sterile drape, hand hygiene and skin antiseptic. A timeout was performed prior to the initiation of the procedure. Patient position supine on the ultrasound table. Right upper quadrant skin prepped and draped in usual sterile fashion. Following local lidocaine administration, 17 gauge introducer needle was advanced into the right liver lesion, and 4- 18 gauge cores were obtained utilizing continuous ultrasound guidance. Gelfoam slurry was administered through the introducer needle at the biopsy site. Samples were sent to pathology in formalin. Needle removed and hemostasis achieved with 5 minutes of manual compression. IMPRESSION: Ultrasound-guided biopsy of right liver mass as above. Electronically Signed   By: Miachel Roux M.D.   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 Patient being seen by CVTS, I will sign off please call if needed  LOS: 5 days    Charolette Forward 06/21/2021, 11:25 AM

## 2021-06-21 NOTE — Anesthesia Preprocedure Evaluation (Signed)
Anesthesia Evaluation  Patient identified by MRN, date of birth, ID band Patient awake    Reviewed: Allergy & Precautions, NPO status , Patient's Chart, lab work & pertinent test results  History of Anesthesia Complications Negative for: history of anesthetic complications  Airway Mallampati: II  TM Distance: >3 FB Neck ROM: Full    Dental  (+) Edentulous Upper, Edentulous Lower   Pulmonary COPD, former smoker,    Pulmonary exam normal        Cardiovascular hypertension, Normal cardiovascular exam  Atrial mass   Neuro/Psych Seizures -,     GI/Hepatic Neg liver ROS, GERD  ,  Endo/Other  diabetes  Renal/GU Renal disease  negative genitourinary   Musculoskeletal negative musculoskeletal ROS (+)   Abdominal   Peds  Hematology  (+) anemia ,   Anesthesia Other Findings  Admitted with CAP and found to have large liver mass and R atrial mass- for TEE  Reproductive/Obstetrics                            Anesthesia Physical Anesthesia Plan  ASA: 3  Anesthesia Plan: MAC   Post-op Pain Management:    Induction: Intravenous  PONV Risk Score and Plan: 1 and Propofol infusion, TIVA and Treatment may vary due to age or medical condition  Airway Management Planned: Natural Airway, Nasal Cannula and Simple Face Mask  Additional Equipment: None  Intra-op Plan:   Post-operative Plan:   Informed Consent: I have reviewed the patients History and Physical, chart, labs and discussed the procedure including the risks, benefits and alternatives for the proposed anesthesia with the patient or authorized representative who has indicated his/her understanding and acceptance.       Plan Discussed with:   Anesthesia Plan Comments:         Anesthesia Quick Evaluation

## 2021-06-21 NOTE — Anesthesia Procedure Notes (Signed)
Procedure Name: MAC Date/Time: 06/21/2021 8:45 AM Performed by: Trinna Post., CRNA Pre-anesthesia Checklist: Patient identified, Emergency Drugs available, Suction available, Patient being monitored and Timeout performed Patient Re-evaluated:Patient Re-evaluated prior to induction Oxygen Delivery Method: Nasal cannula Placement Confirmation: positive ETCO2

## 2021-06-22 ENCOUNTER — Inpatient Hospital Stay (HOSPITAL_COMMUNITY): Payer: No Typology Code available for payment source

## 2021-06-22 DIAGNOSIS — Z01810 Encounter for preprocedural cardiovascular examination: Secondary | ICD-10-CM

## 2021-06-22 LAB — GLUCOSE, CAPILLARY
Glucose-Capillary: 155 mg/dL — ABNORMAL HIGH (ref 70–99)
Glucose-Capillary: 163 mg/dL — ABNORMAL HIGH (ref 70–99)
Glucose-Capillary: 187 mg/dL — ABNORMAL HIGH (ref 70–99)

## 2021-06-22 LAB — SURGICAL PATHOLOGY

## 2021-06-22 MED ORDER — METFORMIN HCL 1000 MG PO TABS: 1000.0000 mg | ORAL_TABLET | Freq: Two times a day (BID) | ORAL | 0 refills | Status: DC

## 2021-06-22 MED ORDER — ROSUVASTATIN CALCIUM 10 MG PO TABS: 10.0000 mg | ORAL_TABLET | Freq: Every day | ORAL | 0 refills | Status: DC

## 2021-06-22 MED ORDER — OXYCODONE HCL 5 MG PO TABS: 5.0000 mg | ORAL_TABLET | ORAL | 0 refills | Status: DC | PRN

## 2021-06-22 NOTE — Telephone Encounter (Signed)
This encounter was created in error - please disregard.

## 2021-06-22 NOTE — Progress Notes (Signed)
CARDIAC REHAB PHASE I   Went to complete preop education with pt. Pt and family state that they are holding off on surgery pending liver biopsy results. Provided support and encouragement. Pt hoping to d/c today. Will follow if pt returns for surgery.  [id number] Rufina Falco, RN BSN 06/22/2021 2:00 PM

## 2021-06-22 NOTE — Progress Notes (Signed)
TCTS PN  Still awaiting liver bx results; the patient would like to be discharged in the meantime. Case was discussed with Dr. Loralie Champagne re: cardiac MRI. We agree that the patient's ultimate course and prognosis will hinge on the liver bx results. He seems a poor candidate for surgery, particularly in light of potential malignancy dx.  Trevor Buntyn Z. Orvan Seen, [place]

## 2021-06-22 NOTE — Progress Notes (Signed)
Pre-CABG study completed.  ° °Please see CV Proc for preliminary results.  ° °Betsaida [place], RDMS, RVT ° °

## 2021-06-22 NOTE — Discharge Summary (Addendum)
Name: Trevor [name] MRN: [id number] DOB: Nov 19, 1946 75 y.o. PCP: Clinic, Trevor [name]  Date of Admission: 06/15/2021  7:29 AM Date of Discharge: 06/22/21 Attending Physician: Dr. Evette [name]  Discharge Diagnosis:  Principal Problem:   Hepatocellular carcinoma ([place]) Active Problems:   Pneumonia   Acute kidney injury ([place])   Normocytic [name]   Right atrial mass   Malnutrition of moderate degree    Discharge Medications: Allergies as of 06/22/2021       Reactions   Atorvastatin Other (See Comments)   Muscle pain   Latex Hives, Rash   Penicillin G Rash   Penicillins Rash        Medication List     STOP taking these medications    amLODipine 5 MG tablet Commonly known as: NORVASC   empagliflozin 25 MG Tabs tablet Commonly known as: JARDIANCE   fluticasone furoate-vilanterol 100-25 MCG/INH Aepb Commonly known as: BREO ELLIPTA   furosemide 20 MG tablet Commonly known as: LASIX   glipiZIDE 10 MG tablet Commonly known as: GLUCOTROL   naproxen sodium 220 MG tablet Commonly known as: ALEVE   prochlorperazine 5 MG tablet Commonly known as: COMPAZINE       TAKE these medications    albuterol 108 (90 Base) MCG/ACT inhaler Commonly known as: VENTOLIN HFA Inhale 1-2 puffs into the lungs every 6 (six) hours as needed for wheezing or shortness of breath. What changed: how much to take   ascorbic acid 250 MG tablet Commonly known as: VITAMIN C Take 250 mg by mouth daily.   aspirin 81 MG chewable tablet Chew 81 mg by mouth daily.   divalproex 500 MG 24 hr tablet Commonly known as: DEPAKOTE ER Take 1 tablet (500 mg total) by mouth 2 (two) times daily. What changed: when to take this   ferrous sulfate 325 (65 FE) MG tablet Take 325 mg by mouth daily with breakfast.   finasteride 5 MG tablet Commonly known as: PROSCAR Take 5 mg by mouth daily after supper.   Fish Oil 1000 MG Caps Take 1,000 mg by mouth daily.   fluticasone 50 MCG/ACT nasal  spray Commonly known as: FLONASE Place 2 sprays into both nostrils daily. What changed:  when to take this reasons to take this   Garlic 0626 MG Caps Take 1,000 mg by mouth daily.   ipratropium-albuterol 0.5-2.5 (3) MG/3ML Soln Commonly known as: DUONEB Take 3 mLs by nebulization every 6 (six) hours as needed (wheezing, shortness of breath).   lisinopril 40 MG tablet Commonly known as: ZESTRIL Take 40 mg by mouth daily.   loratadine 10 MG tablet Commonly known as: CLARITIN Take 10 mg by mouth daily.   Magnesium Oxide 420 MG Tabs Take 420 mg by mouth 2 (two) times daily.   metFORMIN 1000 MG tablet Commonly known as: GLUCOPHAGE Take 1 tablet (1,000 mg total) by mouth 2 (two) times daily with a meal. What changed:  medication strength how much to take when to take this Another medication with the same name was removed. Continue taking this medication, and follow the directions you see here.   metoprolol tartrate 50 MG tablet Commonly known as: LOPRESSOR Take 25 mg by mouth 2 (two) times daily. Hold dose for sbp less than 100 or pulse less than 50   omeprazole 20 MG capsule Commonly known as: PRILOSEC Take 20 mg by mouth daily.   OVER THE COUNTER MEDICATION Apply 1 application topically as needed (itching). Goldbond anti-itch cream   oxyCODONE 5 MG immediate  release tablet Commonly known as: Oxy IR/ROXICODONE Take 1 tablet (5 mg total) by mouth every 4 (four) hours as needed for severe pain or moderate pain.   rosuvastatin 10 MG tablet Commonly known as: CRESTOR Take 1 tablet (10 mg total) by mouth at bedtime. What changed:  medication strength how much to take when to take this Another medication with the same name was removed. Continue taking this medication, and follow the directions you see here.   Vitamin D3 125 MCG (5000 UT) Caps Take 5,000 Units by mouth daily.        Disposition and follow-up:   Trevor [name] was discharged from [facility] in Good condition.  At the hospital follow up visit please address:  1.  Follow-up: A. Trevor [name]: Hepatocellular Carcinoma Work-up and management   B. [facility]: Follow-up Trevor [name]; assess need for additional iron transfusions/ daily iron supplements, blood pressure medications, and diabetes medications     2.  Labs / imaging needed at time of follow-up: none  3.  Pending labs/ test needing follow-up: none  4.  Medication Changes  Started: Oxycodone for pain   Continue: Lisinopril, Metoprolol, Aspirin  Stopped: Glipizide, Amlodipine, Furosemide, Lovenox  Changed: Metformin 1,000 mg Twice Daily   Follow-up Appointments:  Follow-up Information     Trevor [name]. Go in 1 week(s).   Why: On discharge please set up an appointment with the [facility] primary care doctor so that they can set you up with an oncologist to manage the Liver cancer. However, if that falls through, or is unable to be scheduled, please be on the lookout for a phone call from the [facility] as they will have a back up appointment for you.                Hospital Course by problem list: Trevor [name] is a 75 year old male with a past medical history of  COPD, Diabetes Mellitus, HTN, hypercholesteremia, seizures and BPH who presented to [facility] with Community- Acquired Pneumonia and a newly found Hepatic Mass concerning for malignancy.   Community Acquired Pneumonia Trevor [name] presented with a 2 week history of cough productive of green-colored sputum, congestion, and leukocytosis of 16.6. Chest x-ray unremarkable but CTAP showed heterogenous airspace disease bilaterally. Procalcitonin was elevated to .83, strep. Pneumonia and legionella antigen were negative. He received a 5 day course of Azithromycin and Ceftriaxone for community-acquired pneumonia. By day of discharge his cough and breathing were markedly improved. White count down  trended and remained stable at 11.8 by time of discharge.  New Hepatocellular Carcinoma Advanced Stage C Trevor [name] was found to have a hepatic mass on CT Abdomen sized 14.4 x 9.4 x 9.4 cm, which was confirmed on Liver MRI. LDH and AFP makers were also elevated. In the setting of his reported unintentional weight loss, there was concern for hepatic malignancy (hepatocellular carcinoma vs. Cholangiocarcinoma) hence IR liver biopsy was obtained. Biopsy pathology came back positive for Hepatocellular Carcinoma. Results were shared with the patient. He will follow up with the [facility]. He has Child Pugh B cirrhosis and somewhat low functional status, so I anticipate his treatment options will be palliative Sorafenib or Nivolumab.   Right Atrial Mass His wife reported that he was also found to have a cardiac mass on echocardiogram at the [place]. He had a repeat transthoracic echocardiogram during this admission which showed LVEF 60-65% with a normal  function and no regional wall motion abnormalities as well as a large mass in the right atrium. TEE was obtained for better visualization of the mass which showed a free-floating large mass in the right atrium occupying about 80% of the cavity. CT surgery was consulted and they recommended cardiac MRI for further characterization and stated that no surgical intervention is indicated during this admission. This is probably a large metastasis from the [facility], and treatment will probably be palliative.   Iron Deficiency [name] Throughout the course of the admission, his Hgb was stable around 9.0 and with MCV around 79. Iron studies showed a ferritin of 41, iron of 22, and saturation ratio of 5. Patient was transfused Ferraheme x1 during this admission.    Discharge Subjective: This morning, Mr. Zeek endorsed soreness and pain liver biopsy site that is responsive to tylenol comes back every few hours. He requests a stronger pain medication. He states that his  breathing is alright. His wife shares that he has not had much of an appetite today either.   Discharge Exam:   BP 137/60 (BP Location: Right Arm)   Pulse 72   Temp 98.6 F (37 C)   Resp 20   Ht 5\' 11"  (1.803 m)   Wt 79.4 kg   SpO2 97%   BMI 24.41 kg/m  Constitutional: tired and weak-appearing male sitting in bed, in no acute distress Cardiovascular: regular rate and rhythm, no extra heart sounds, trace pitting edema bilaterally L>R Pulmonary/Chest: normal work of breathing on room air, upper and lower lung fields clear to auscultation bilaterally Abdominal: soft, non-tender, non-distended, biopsy site slightly tender to palpation without edema or erythema Neurological: alert & answering questions appropriately Psych: affect depressed and appropriate for situation   Pertinent Labs, Studies, and Procedures:  CBC Latest Ref Rng & Units 06/19/2021 06/18/2021 06/17/2021  WBC 4.0 - 10.5 K/uL 11.6(H) 11.8(H) 11.8(H)  Hemoglobin 13.0 - 17.0 g/dL 9.4(L) 9.2(L) 9.0(L)  Hematocrit 39.0 - 52.0 % 30.9(L) 30.1(L) 29.5(L)  Platelets 150 - 400 K/uL 421(H) 447(H) 460(H)    CMP Latest Ref Rng & Units 06/19/2021 06/18/2021 06/16/2021  Glucose 70 - 99 mg/dL 93 137(H) 202(H)  BUN 8 - 23 mg/dL 11 9 15   Creatinine 0.61 - 1.24 mg/dL 0.94 0.87 1.06  Sodium 135 - 145 mmol/L 137 139 140  Potassium 3.5 - 5.1 mmol/L 3.9 3.8 4.0  Chloride 98 - 111 mmol/L 109 106 108  CO2 22 - 32 mmol/L 20(L) 21(L) 22  Calcium 8.9 - 10.3 mg/dL 8.9 8.8(L) 9.0  Total Protein 6.5 - 8.1 g/dL - - 5.7(L)  Total Bilirubin 0.3 - 1.2 mg/dL - - 1.2  Alkaline Phos 38 - 126 U/L - - 156(H)  AST 15 - 41 U/L - - 48(H)  ALT 0 - 44 U/L - - 46(H)    Discharge Instructions:  Hi Mr. Ronzell, [place] were admitted to [facility] for the following medical problems:  1) Community-Acquired Pneumonia: You received 5 days of antibiotic therapy to treat the pneumonia, which improved your shortness of breath and congestion.   2) Liver Mass:  Imaging of your abdomen showed a large mass in your liver, which was concerning for cancer. You underwent a liver biopsy, and the results for the biopsy shows diagnosis hepatocellular carcinoma. We have reached out to the [facility] for further work-up and management of the cancer. They will give you a call to inform you of a day and time for a follow-up appointment.  If you do not hear from them within the next, please call them at [phone].  3) Heart Mass: Your wife shares with [place] that a heart echocardiogram done at the [place] showed a mass in your heart. As we could not access those records, we repeated that imaging and it confirmed that there is a mass that is occupying about 80% of your right atrium of your heart. We reached out to the cardiac surgery team to discuss these results, and after examining you they felt that you did not need heart surgery to remove or sample the mass during this hospital stay. You are encouraged to keep your appointment for the cardiac MRI.  4) Iron Deficiency [name]: Your Iron levels were low so you were given an iron transfusion. We recommend you follow up with your primary care doctor to monitor your [name].   Please call 911 or go to your nearest emergency room if you develop shortness of breath, chest pain, or difficulty breathing.   Thank you for letting us take care of you and good luck!  SignedOk Edwards, Medical Student 06/22/2021, 4:36 PM   Pager: [phone]

## 2021-06-22 NOTE — Progress Notes (Signed)
Pt ordered to be d/c by provider, at this time , he is alert and oriented, no acute distress noted, discharge instructions received, family acknowledged understanding

## 2021-06-23 ENCOUNTER — Other Ambulatory Visit: Payer: Self-pay | Admitting: *Deleted

## 2021-06-23 MED ORDER — METFORMIN HCL 1000 MG PO TABS: 1000.0000 mg | ORAL_TABLET | Freq: Two times a day (BID) | ORAL | 0 refills | Status: AC

## 2021-06-23 MED ORDER — OMEPRAZOLE 20 MG PO CPDR: 20.0000 mg | DELAYED_RELEASE_CAPSULE | Freq: Every day | ORAL | 1 refills | Status: DC

## 2021-06-23 NOTE — Telephone Encounter (Signed)
Trevor [name] from [facility] called stating their system cannot accommodate the suffix on Resident DEAs. Requesting meds be resent by Attending.

## 2021-06-24 LAB — ECHO TEE
AV Peak grad: 8.4 mmHg
Ao pk vel: 1.45 m/s

## 2021-06-26 ENCOUNTER — Telehealth: Payer: Self-pay | Admitting: Physician Assistant

## 2021-06-26 NOTE — Telephone Encounter (Signed)
Received a scheduling msg for Mr. Bhagat to see either Dr. Lorenso Courier or Murray Hodgkins. Mr. Brindisi has been cld and scheduled to see Murray Hodgkins on 7/13 at 2pm. Pt aware to arrive 20 minutes early.

## 2021-06-28 ENCOUNTER — Ambulatory Visit: Payer: No Typology Code available for payment source | Admitting: Physician Assistant

## 2021-06-28 ENCOUNTER — Other Ambulatory Visit: Payer: Self-pay

## 2021-06-28 ENCOUNTER — Inpatient Hospital Stay: Payer: Medicare Other

## 2021-06-28 ENCOUNTER — Telehealth: Payer: Self-pay | Admitting: Physician Assistant

## 2021-06-28 ENCOUNTER — Other Ambulatory Visit: Payer: No Typology Code available for payment source

## 2021-06-28 ENCOUNTER — Encounter: Payer: Self-pay | Admitting: Physician Assistant

## 2021-06-28 ENCOUNTER — Inpatient Hospital Stay: Payer: Medicare Other | Attending: Physician Assistant | Admitting: Physician Assistant

## 2021-06-28 VITALS — BP 122/58 | HR 85 | Temp 98.2°F | Resp 20 | Ht 71.0 in | Wt 171.9 lb

## 2021-06-28 DIAGNOSIS — I700 Atherosclerosis of aorta: Secondary | ICD-10-CM | POA: Insufficient documentation

## 2021-06-28 DIAGNOSIS — C220 Liver cell carcinoma: Secondary | ICD-10-CM

## 2021-06-28 DIAGNOSIS — R634 Abnormal weight loss: Secondary | ICD-10-CM | POA: Insufficient documentation

## 2021-06-28 DIAGNOSIS — I083 Combined rheumatic disorders of mitral, aortic and tricuspid valves: Secondary | ICD-10-CM | POA: Insufficient documentation

## 2021-06-28 DIAGNOSIS — L299 Pruritus, unspecified: Secondary | ICD-10-CM | POA: Insufficient documentation

## 2021-06-28 LAB — CMP (CANCER CENTER ONLY)
ALT: 55 U/L — ABNORMAL HIGH (ref 0–44)
AST: 65 U/L — ABNORMAL HIGH (ref 15–41)
Albumin: 2.7 g/dL — ABNORMAL LOW (ref 3.5–5.0)
Alkaline Phosphatase: 285 U/L — ABNORMAL HIGH (ref 38–126)
Anion gap: 9 (ref 5–15)
BUN: 15 mg/dL (ref 8–23)
CO2: 26 mmol/L (ref 22–32)
Calcium: 9.4 mg/dL (ref 8.9–10.3)
Chloride: 105 mmol/L (ref 98–111)
Creatinine: 1.22 mg/dL (ref 0.61–1.24)
GFR, Estimated: >60 mL/min (ref >60)
Glucose, Bld: 216 mg/dL — ABNORMAL HIGH (ref 70–99)
Potassium: 5.1 mmol/L (ref 3.5–5.1)
Sodium: 140 mmol/L (ref 135–145)
Total Bilirubin: 1.2 mg/dL (ref 0.3–1.2)
Total Protein: 7.4 g/dL (ref 6.5–8.1)

## 2021-06-28 LAB — CBC WITH DIFFERENTIAL (CANCER CENTER ONLY)
Abs Immature Granulocytes: 0.02 10*3/uL (ref 0.00–0.07)
Basophils Absolute: 0.1 10*3/uL (ref 0.0–0.1)
Basophils Relative: 1 %
Eosinophils Absolute: 0.5 10*3/uL (ref 0.0–0.5)
Eosinophils Relative: 5 %
HCT: 37.2 % — ABNORMAL LOW (ref 39.0–52.0)
Hemoglobin: 11.2 g/dL — ABNORMAL LOW (ref 13.0–17.0)
Immature Granulocytes: 0 %
Lymphocytes Relative: 24 %
Lymphs Abs: 2.3 10*3/uL (ref 0.7–4.0)
MCH: 25.9 pg — ABNORMAL LOW (ref 26.0–34.0)
MCHC: 30.1 g/dL (ref 30.0–36.0)
MCV: 86.1 fL (ref 80.0–100.0)
Monocytes Absolute: 0.9 10*3/uL (ref 0.1–1.0)
Monocytes Relative: 10 %
Neutro Abs: 5.9 10*3/uL (ref 1.7–7.7)
Neutrophils Relative %: 60 %
Platelet Count: 388 10*3/uL (ref 150–400)
RBC: 4.32 MIL/uL (ref 4.22–5.81)
RDW: 24.6 % — ABNORMAL HIGH (ref 11.5–15.5)
WBC Count: 9.8 10*3/uL (ref 4.0–10.5)
nRBC: 0 % (ref 0.0–0.2)

## 2021-06-28 LAB — PROTIME-INR
INR: 0.9 (ref 0.8–1.2)
Prothrombin Time: 12.6 seconds (ref 11.4–15.2)

## 2021-06-28 MED ORDER — HYDROXYZINE HCL 10 MG PO TABS: 10.0000 mg | ORAL_TABLET | Freq: Three times a day (TID) | ORAL | 0 refills | Status: DC | PRN

## 2021-06-28 MED ORDER — HYDROCORTISONE 1 % EX LOTN: 1.0000 "application " | TOPICAL_LOTION | Freq: Two times a day (BID) | CUTANEOUS | 0 refills | Status: AC

## 2021-06-28 NOTE — Telephone Encounter (Signed)
Moved todays appt to earlier time per provider request. Called and left msg for patient. Will attempt to call again

## 2021-06-28 NOTE — Progress Notes (Signed)
I met with Mr and Mrs Ledvina and their daughter.  I discussed my role as the GI Oncology Nurse Navigator.  I provided my contact information.  I discussed ways to increase protein and calories into his diet.  I told them I would place a nutrition consult.  The verbalized understanding

## 2021-06-28 NOTE — Progress Notes (Signed)
[place] [phone]   [phone] [place] NOTE  Patient Care Team: [facility] as PCP - General Inda Castle, MD (Inactive) as Consulting Physician (Gastroenterology) Newt Minion, MD as Consulting Physician (Orthopedic Surgery) Garvin Fila, MD as Consulting Physician (Neurology) Orson Slick, MD as Consulting Physician (Oncology) Cordelia Poche as Physician Assistant (Oncology) Royston Bake, RN as Oncology Nurse Navigator  Hematological/Oncological History 1) 06/15/2021-06/22/2021: Admitted for community acquired pneumonia and newly found hepatic mass concerning for malignancy.  --06/15/2021: CT abdomen/pelvis: Large, somewhat ill-defined, masslike heterogeneous lesion of the anterior right lobe of the liver, with areas of heterogeneous hypodensity and capsular retraction, the central, dominant component measuring approximately 11.7 x 6.8 cm --06/16/2021: Echocardiogram: Large mass in the right atrium. --06/17/2021: MR Liver: Large arterial phase enhancing mass involving segment 7, 8, 4A and 4B, which shows washout on delayed images. Right atrial mass, indeterminate.Mild splenomegaly. Trace bilateral pleural effusions and small volume of ascites.Borderline enlarged upper abdominal lymph nodes are identified. --06/19/2021: CT chest: Patchy small densities in [name] lower lobes. Some of these densities have a tree-in-bud configuration and findings are suggestive for an infectious or inflammatory process. Small amount of consolidation in the medial right lower lobe.No clear evidence for neoplasm or metastatic disease within the chest. Borderline sized mediastinal lymph nodes are indeterminate.In addition, there is an indeterminate 5 mm nodule at the right lung base. --06/20/2021: US guided liver biopsy. Pathology confirmed hepatocellular carcinoma.  --06/21/2021: Repeat Echocardiogram: Large mobile 6 x 4 cm mass attached  to the IA septum.    2) 06/28/2021: Establish care with Hematology/Oncology    CHIEF COMPLAINTS/PURPOSE OF CONSULTATION:  Hepatocellular carcinoma  HISTORY OF PRESENTING ILLNESS:  Trevor [name] 75 y.o. male with medical history significant for BPH, COPD, DM, GERD, hyperlipidemia, HTN and seizures. Patient is accompanied by his wife and daughter-in-law for this visit.   On exam today, Trevor [name] reports persistent but chronic fatigue that has not worsened after recent hospital discharge. He continues to complete his ADLs on his own although he needs to rest periodically. He  notes loosing approximately 2-3 lbs per week. He attributes his weight loss to decreased PO intake. Patient denies any nausea, vomiting or abdominal pain. He notes some abdominal distention. Patient reports constipation with a bowel movement every 3 days. He denies easy bruising or signs of bleeding. Patient reports diffuse pruritis for the last 2-3 weeks. He is currently applying OTC Gold Bond cream with minimal improvement. Patient denies any fevers, chills, night sweats, shortness of breath, chest pain or cough. He has no other complaints. Rest of the 10 point ROS is below.   MEDICAL HISTORY:  Past Medical History:  Diagnosis Date   BPH (benign prostatic hyperplasia)    COPD (chronic obstructive pulmonary disease) (HCC)    COPD (chronic obstructive pulmonary disease) ([place])    COVID-19    Diabetes mellitus without complication (HCC)    GERD (gastroesophageal reflux disease)    Hypercholesterolemia    Hypertension    Seizures ([place])    Type II or unspecified type diabetes mellitus without mention of complication, not stated as uncontrolled     SURGICAL HISTORY: Past Surgical History:  Procedure Laterality Date   BUBBLE STUDY  06/21/2021   Procedure: BUBBLE STUDY;  Surgeon: Dixie Dials, MD;  Location: [facility];  Service: Cardiovascular;;   HAND SURGERY Left 1953   IR US GUIDE BX ASP/DRAIN  06/20/2021   KNEE  ARTHROSCOPY Left 1998   TEE WITHOUT CARDIOVERSION N/A 06/21/2021   Procedure: TRANSESOPHAGEAL ECHOCARDIOGRAM (TEE);  Surgeon: Dixie Dials, MD;  Location: [facility];  Service: Cardiovascular;  Laterality: N/A;    SOCIAL HISTORY: Social History   Socioeconomic History   Marital status: Married    Spouse name: Not on file   Number of children: Not on file   Years of education: Not on file   Highest education level: Not on file  Occupational History   Not on file  Tobacco Use   Smoking status: Former    Packs/day: 1.00    Years: 50.00    Pack years: 50.00    Types: Cigarettes    Quit date: 12/17/2013    Years since quitting: 7.5   Smokeless tobacco: Never  Vaping Use   Vaping Use: Never used  Substance and Sexual Activity   Alcohol use: Not Currently   Drug use: No   Sexual activity: Not on file  Other Topics Concern   Not on file  Social History Narrative   Not on file   Social Determinants of Health   Financial Resource Strain: Low Risk    Difficulty of Paying Living Expenses: Not hard at all  Food Insecurity: No Food Insecurity   Worried About Charity fundraiser in the Last Year: Never true   [place] in the Last Year: Never true  Transportation Needs: No Transportation Needs   Lack of Transportation (Medical): No   Lack of Transportation (Non-Medical): No  Physical Activity: Not on file  Stress: No Stress Concern Present   Feeling of Stress : Only a little  Social Connections: Moderately Integrated   Frequency of Communication with Friends and Family: More than three times a week   Frequency of Social Gatherings with Friends and Family: More than three times a week   Attends Religious Services: More than 4 times per year   Active Member of [employer] or Organizations: No   Attends Music therapist: Never   Marital Status: Married  Human resources officer Violence: Not on file    FAMILY HISTORY: Family History  Problem Relation Age of Onset   CVA  Mother    Thyroid disease Mother    Heart disease Father    Diabetes Sister    Heart disease Sister    Diabetes Brother    Heart disease Brother    Epilepsy Brother    Schizophrenia Brother    Cancer Maternal Grandfather    Cancer Maternal Aunt     ALLERGIES:  is allergic to atorvastatin, latex, penicillin g, and penicillins.  MEDICATIONS:  Current Outpatient Medications  Medication Sig Dispense Refill   albuterol (VENTOLIN HFA) 108 (90 Base) MCG/ACT inhaler Inhale 1-2 puffs into the lungs every 6 (six) hours as needed for wheezing or shortness of breath. 18 g 0   ascorbic acid (VITAMIN C) 250 MG tablet Take 250 mg by mouth daily.     aspirin 81 MG chewable tablet Chew 81 mg by mouth daily.     Cholecalciferol (VITAMIN D3) 125 MCG (5000 UT) CAPS Take 5,000 Units by mouth daily.     divalproex (DEPAKOTE ER) 500 MG 24 hr tablet Take 1 tablet (500 mg total) by mouth 2 (two) times daily. 180 tablet 1   ferrous sulfate 325 (65 FE) MG tablet Take 325 mg by mouth daily with breakfast.     finasteride (PROSCAR) 5 MG tablet Take 5 mg by mouth daily after supper.  fluticasone (FLONASE) 50 MCG/ACT nasal spray Place 2 sprays into [name] nostrils daily. 16 g 0   Garlic 2683 MG CAPS Take 1,000 mg by mouth daily.     hydrocortisone 1 % lotion Apply 1 application topically 2 (two) times daily. 118 mL 0   hydrOXYzine (ATARAX/VISTARIL) 10 MG tablet Take 1 tablet (10 mg total) by mouth 3 (three) times daily as needed for itching. 30 tablet 0   ipratropium-albuterol (DUONEB) 0.5-2.5 (3) MG/3ML SOLN Take 3 mLs by nebulization every 6 (six) hours as needed (wheezing, shortness of breath). 360 mL 0   lisinopril (PRINIVIL,ZESTRIL) 40 MG tablet Take 40 mg by mouth daily.     loratadine (CLARITIN) 10 MG tablet Take 10 mg by mouth daily.     Magnesium Oxide 420 MG TABS Take 420 mg by mouth 2 (two) times daily.     metFORMIN (GLUCOPHAGE) 1000 MG tablet Take 1 tablet (1,000 mg total) by mouth 2 (two) times  daily with a meal. 60 tablet 0   metoprolol tartrate (LOPRESSOR) 50 MG tablet Take 25 mg by mouth 2 (two) times daily. Hold dose for sbp less than 100 or pulse less than 50     Omega-3 Fatty Acids (FISH OIL) 1000 MG CAPS Take 1,000 mg by mouth daily.     omeprazole (PRILOSEC) 20 MG capsule Take 1 capsule (20 mg total) by mouth daily. 30 capsule 1   OVER THE COUNTER MEDICATION Apply 1 application topically as needed (itching). Goldbond anti-itch cream     oxyCODONE (OXY IR/ROXICODONE) 5 MG immediate release tablet Take 1 tablet (5 mg total) by mouth every 4 (four) hours as needed for severe pain or moderate pain. 30 tablet 0   rosuvastatin (CRESTOR) 10 MG tablet Take 1 tablet (10 mg total) by mouth at bedtime. 30 tablet 0   No current facility-administered medications for this visit.    REVIEW OF SYSTEMS:   Constitutional: ( - ) fevers, ( - )  chills , ( - ) night sweats Eyes: ( - ) blurriness of vision, ( - ) double vision, ( - ) watery eyes Ears, nose, mouth, throat, and face: ( - ) mucositis, ( - ) sore throat Respiratory: ( - ) cough, ( - ) dyspnea, ( - ) wheezes Cardiovascular: ( - ) palpitation, ( - ) chest discomfort, ( + ) lower extremity swelling Gastrointestinal:  ( - ) nausea, ( - ) heartburn, ( +  ) change in bowel habits Skin: ( - ) abnormal skin rashes Lymphatics: ( - ) new lymphadenopathy, ( - ) easy bruising Neurological: ( - ) numbness, ( - ) tingling, ( - ) new weaknesses Behavioral/Psych: ( - ) mood change, ( - ) new changes  All other systems were reviewed with the patient and are negative.  PHYSICAL EXAMINATION: ECOG PERFORMANCE STATUS: 1 - Symptomatic but completely ambulatory  Vitals:   06/28/21 1318  BP: (!) 122/58  Pulse: 85  Resp: 20  Temp: 98.2 F (36.8 C)  SpO2: 100%   Filed Weights   06/28/21 1318  Weight: 171 lb 14.4 oz (78 kg)    GENERAL: not acute distress.  SKIN: skin color, texture, turgor are normal, no rashes. Diffuse excoriation in the  upper and lower extremities, abdomen and trunk.  EYES: conjunctiva are pink and non-injected, sclera clear OROPHARYNX: no exudate, no erythema; lips, buccal mucosa, and tongue normal  NECK: supple, non-tender LYMPH:  no palpable lymphadenopathy in the cervical, axillary or supraclavicular lymph nodes.  LUNGS:  clear to auscultation and percussion with normal breathing effort HEART: regular rate & rhythm and no murmurs. Bilateral lower extremity edema ABDOMEN: soft, non-tender, normal bowel sounds. Mild distention.  Musculoskeletal: no cyanosis of digits and no clubbing  PSYCH: alert & oriented x 3, fluent speech NEURO: no focal motor/sensory deficits  LABORATORY DATA:  I have reviewed the data as listed CBC Latest Ref Rng & Units 06/28/2021 06/19/2021 06/18/2021  WBC 4.0 - 10.5 K/uL 9.8 11.6(H) 11.8(H)  Hemoglobin 13.0 - 17.0 g/dL 11.2(L) 9.4(L) 9.2(L)  Hematocrit 39.0 - 52.0 % 37.2(L) 30.9(L) 30.1(L)  Platelets 150 - 400 K/uL 388 421(H) 447(H)    CMP Latest Ref Rng & Units 06/28/2021 06/19/2021 06/18/2021  Glucose 70 - 99 mg/dL 216(H) 93 137(H)  BUN 8 - 23 mg/dL 15 11 9   Creatinine 0.61 - 1.24 mg/dL 1.22 0.94 0.87  Sodium 135 - 145 mmol/L 140 137 139  Potassium 3.5 - 5.1 mmol/L 5.1 3.9 3.8  Chloride 98 - 111 mmol/L 105 109 106  CO2 22 - 32 mmol/L 26 20(L) 21(L)  Calcium 8.9 - 10.3 mg/dL 9.4 8.9 8.8(L)  Total Protein 6.5 - 8.1 g/dL 7.4 - -  Total Bilirubin 0.3 - 1.2 mg/dL 1.2 - -  Alkaline Phos 38 - 126 U/L 285(H) - -  AST 15 - 41 U/L 65(H) - -  ALT 0 - 44 U/L 55(H) - -     PATHOLOGY: SURGICAL PATHOLOGY  CASE: [id number]  Clinical History: right liver mass (cm)   FINAL MICROSCOPIC DIAGNOSIS:   A. LIVER MASS, RIGHT, NEEDLE CORE BIOPSY:  - Hepatocellular carcinoma.  - See comment.   COMMENT:   The tumor is positive with HepPar 1 and arginase 1, CD34 highlights  vasculature and polyclonal CEA has a canalicular pattern.  The tumor is  negative with MOC31, cytokeratin 5/6 and  cytokeratin 7.  The morphology  and immunophenotype are consistent with hepatocellular carcinoma.  RADIOGRAPHIC STUDIES: I have personally reviewed the radiological images as listed and agreed with the findings in the report. DG Chest 2 View  Result Date: 06/15/2021 CLINICAL DATA:  Shortness of breath with cough and congestion EXAM: CHEST - 2 VIEW COMPARISON:  November 17, 2020 FINDINGS: Lungs are clear. Heart size and pulmonary vascularity are normal. No adenopathy. There is aortic atherosclerosis. No bone lesions. IMPRESSION: Lungs clear. Cardiac silhouette normal. Aortic Atherosclerosis (ICD10-I70.0). Electronically Signed   By: Lowella Grip III M.D.   On: 06/15/2021 09:20   CT CHEST W CONTRAST  Result Date: 06/19/2021 CLINICAL DATA:  75 year old with hepatic mass and weight loss. Malignancy evaluation. EXAM: CT CHEST WITH CONTRAST TECHNIQUE: Multidetector CT imaging of the chest was performed during intravenous contrast administration. CONTRAST:  59mL OMNIPAQUE IOHEXOL 300 MG/ML  SOLN COMPARISON:  CT abdomen 06/15/2021.  MRI abdomen 06/17/2021 FINDINGS: Cardiovascular: Atherosclerotic calcifications involving the thoracic aorta without enlargement. Coronary artery calcifications. Heart size is normal without significant pericardial fluid. Mediastinum/Nodes: Tiny low-density in the right thyroid lobe. No suspicious thyroid nodules. Mildly prominent nodal tissue at the AP window measuring 1.1 cm in the short axis on sequence 3, image 63. Subcarinal tissue is mildly prominent measuring 1.1 cm in the short axis on image 83, sequence 3. No significant hilar lymph node enlargement. No axillary lymph node enlargement. Small lymph nodes along the distal esophagus are nonspecific. Mildly prominent lymph node in the right epicardial fat on sequence 3, image 118 measures 9 mm in the short axis. Lungs/Pleura: Trachea and mainstem bronchi are patent. Small patchy  densities in the right lower lobe. Some of these  lung densities have a tree-in-bud configuration. Similar small nodular and tree-in-bud configurations in the left lower lobe. Small amount of posterior atelectasis or consolidation in the right lower lobe. 5 mm peripheral nodule in the right lower lobe on sequence 5 image 141. No pleural effusions. Upper Abdomen: Again noted is a poorly defined lesion involving the right hepatic lobe and portions of medial left hepatic lobe. Main portal venous system is patent but there could be involvement or displacement of the right portal venous system. Small lymph nodes in the upper abdomen and porta hepatis region. Musculoskeletal: No acute bone abnormality. IMPRESSION: 1. Patchy small densities in [name] lower lobes. Some of these densities have a tree-in-bud configuration and findings are suggestive for an infectious or inflammatory process. Small amount of consolidation in the medial right lower lobe. 2. No clear evidence for neoplasm or metastatic disease within the chest. Borderline sized mediastinal lymph nodes are indeterminate. In addition, there is an indeterminate 5 mm nodule at the right lung base. Recommend attention to these areas on follow-up imaging. 3. Large infiltrative lesion involving the liver. This is compatible with a neoplastic process and refer to the recent MRI examination. 4.  Aortic Atherosclerosis (ICD10-I70.0). Electronically Signed   By: Markus Daft M.D.   On: 06/19/2021 15:16   CT Abdomen Pelvis W Contrast  Result Date: 06/15/2021 CLINICAL DATA:  Left lower quadrant abdominal pain EXAM: CT ABDOMEN AND PELVIS WITH CONTRAST TECHNIQUE: Multidetector CT imaging of the abdomen and pelvis was performed using the standard protocol following bolus administration of intravenous contrast. CONTRAST:  137mL OMNIPAQUE IOHEXOL 350 MG/ML SOLN COMPARISON:  None. FINDINGS: Lower chest: Clustered, heterogeneous airspace disease in the right greater than left lung bases (series 5, image 10). Coronary artery  calcifications. Hepatobiliary: Large, somewhat ill-defined, masslike heterogeneous lesion of the anterior right lobe of the liver, with areas of heterogeneous hypodensity and capsular retraction, the central, dominant component measuring approximately 11.7 x 6.8 cm (series 3, image 18). Small gallstones near the gallbladder neck. Pancreas: Unremarkable. No pancreatic ductal dilatation or surrounding inflammatory changes. Spleen: Normal in size without significant abnormality. Adrenals/Urinary Tract: Adrenal glands are unremarkable. Kidneys are normal, without renal calculi, solid lesion, or hydronephrosis. Bladder is unremarkable. Stomach/Bowel: Stomach is within normal limits. Appendix appears normal. No evidence of bowel wall thickening, distention, or inflammatory changes. Vascular/Lymphatic: Aortic atherosclerosis. No enlarged abdominal or pelvic lymph nodes. Reproductive: No mass or other significant abnormality. Other: No abdominal wall hernia or abnormality. No abdominopelvic ascites. Musculoskeletal: No acute or significant osseous findings. IMPRESSION: 1. No acute CT findings of the abdomen or pelvis to explain left lower quadrant abdominal pain. 2. Clustered, heterogeneous airspace disease in the right greater than left lung bases , concerning for infection or aspiration. 3. Large, somewhat ill-defined, masslike heterogeneous lesion of the anterior right lobe of the liver, with areas of heterogeneous hypodensity and capsular retraction, the central, dominant component measuring approximately 11.7 x 6.8 cm This is of uncertain nature but concerning for malignancy, particularly intrahepatic cholangiocarcinoma. Recommend nonemergent multiphasic contrast enhanced MRI to further evaluate. 4. Cholelithiasis. 5. Coronary artery disease. Aortic Atherosclerosis (ICD10-I70.0). Electronically Signed   By: Eddie Candle M.D.   On: 06/15/2021 12:47   MR LIVER W WO CONTRAST  Result Date: 06/17/2021 CLINICAL DATA:   Evaluate liver mass. EXAM: MRI ABDOMEN WITHOUT AND WITH CONTRAST TECHNIQUE: Multiplanar multisequence MR imaging of the abdomen was performed [name] before and after the administration of intravenous  contrast. CONTRAST:  37mL GADAVIST GADOBUTROL 1 MMOL/ML IV SOLN COMPARISON:  06/15/2021 FINDINGS: Lower chest: Trace bilateral pleural effusions. Filling defect within the right atrium is identified measuring approximately 4.4 cm. The intrahepatic IVC appears patent. Hepatobiliary: On the arterial phase images there is a large hyperenhancing mass involving segment 7, 8, 4 a, and 4B. This measures 14.4 by 9.4 by 9.4 cm. The mass has heterogeneous arterial phase enhancement. On the delayed images there is washout of contrast from within this mass. Several additional, smaller satellite lesions are identified which also exhibit arterial phase enhancement including a 1.3 cm lesion in the periphery of the right hepatic lobe, image 59/1001. The gallbladder is collapsed and contains a 6 mm stone. Mild edema the gallbladder wall. No signs of bile duct dilatation. Pancreas: No mass, inflammatory changes, or other parenchymal abnormality identified. Spleen: The spleen measures 14.4 by 9.8 by 5.8 cm (volume = 430 cm^3). No focal splenic abnormality. Adrenals/Urinary Tract: Normal appearance of the adrenal glands. No kidney mass or hydronephrosis. Stomach/Bowel: Visualized portions within the abdomen are unremarkable. Vascular/Lymphatic: Normal caliber of the abdominal aorta.  Portal vein appears patent. Porta hepatics lymph node is borderline enlarged measuring 1 cm, image 21/. Gastrohepatic ligament nodes measure 1.2 cm, image 18/6. Portacaval lymph node measures 1 cm, image 22/6 Other:  Small volume of ascites identified. Musculoskeletal: No suspicious bone lesions identified. IMPRESSION: 1. Large arterial phase enhancing mass involving segment 7, 8, 4A and 4B, which shows washout on delayed images. In a patient with cirrhosis or  viral hepatitis findings are highly concerning for infiltrative hepatocellular carcinoma. In the absence risk factors for hepatocellular carcinoma other potential diagnostic considerations include intrahepatic cholangiocarcinoma or less likely metastatic disease. Clinical correlation including assessment alpha fetoprotein levels advised. Several additional smaller satellite lesions identified which also exhibit arterial phase enhancement inter suspicious for malignancy. 2. Right atrial mass, indeterminate. 3. Mild splenomegaly. 4. Trace bilateral pleural effusions and small volume of ascites. 5. Borderline enlarged upper abdominal lymph nodes are identified. Cannot exclude metastatic. Electronically Signed   By: Kerby Moors M.D.   On: 06/17/2021 18:58   IR US Guide Bx Asp/Drain  Result Date: 06/20/2021 INDICATION: Right liver mass EXAM: Ultrasound-guided biopsy of right liver mass MEDICATIONS: None. ANESTHESIA/SEDATION: Moderate (conscious) sedation was employed during this procedure. A total of Versed 1.5 mg and Fentanyl 50 mcg was administered intravenously. Moderate Sedation Time: 15 minutes. The patient's level of consciousness and vital signs were monitored continuously by radiology nursing throughout the procedure under my direct supervision. COMPLICATIONS: None immediate. PROCEDURE: Informed written consent was obtained from the patient after a thorough discussion of the procedural risks, benefits and alternatives. All questions were addressed. Maximal Sterile Barrier Technique was utilized including caps, mask, sterile gowns, sterile gloves, sterile drape, hand hygiene and skin antiseptic. A timeout was performed prior to the initiation of the procedure. Patient position supine on the ultrasound table. Right upper quadrant skin prepped and draped in usual sterile fashion. Following local lidocaine administration, 17 gauge introducer needle was advanced into the right liver lesion, and 4- 18 gauge cores  were obtained utilizing continuous ultrasound guidance. Gelfoam slurry was administered through the introducer needle at the biopsy site. Samples were sent to pathology in formalin. Needle removed and hemostasis achieved with 5 minutes of manual compression. IMPRESSION: Ultrasound-guided biopsy of right liver mass as above. Electronically Signed   By: Miachel Roux M.D.   On: 06/20/2021 13:07   ECHOCARDIOGRAM COMPLETE  Result Date: 06/16/2021  ECHOCARDIOGRAM REPORT   Patient Name:   Trevor [name] Date of Exam: 06/16/2021 Medical Rec #:  [id number]     Height:       71.0 in Accession #:    [id number]    Weight:       177.2 lb Date of Birth:  1946-11-01     BSA:          2.003 m Patient Age:    27 years      BP:           140/62 mmHg Patient Gender: M             HR:           87 bpm. Exam Location:  Inpatient Procedure: 2D Echo, Cardiac Doppler, Color Doppler and Intracardiac            Opacification Agent Indications:    Other abnormalities of the heart R00.8  History:        Patient has no prior history of Echocardiogram examinations.                 COPD; Risk Factors:Hypertension, Diabetes, Dyslipidemia and                 Former Smoker. GERD.  Sonographer:    Vickie Epley RDCS Referring Phys: [id number] [facility]  Sonographer Comments: Mass in heart, reported by [place]. IMPRESSIONS  1. Left ventricular ejection fraction, by estimation, is 60 to 65%. The left ventricle has normal function. The left ventricle has no regional wall motion abnormalities. Left ventricular diastolic parameters were normal.  2. Right ventricular systolic function is normal. The right ventricular size is normal.  3. There is a large mass in the Right atrium. Possibilities include atrial myxoma or thrombus. Suggest TEE for better views of this mass.  4. The mitral valve is grossly normal. No evidence of mitral valve regurgitation.  5. The aortic valve is grossly normal. There is mild calcification of the aortic valve. Aortic valve  regurgitation is not visualized. No aortic stenosis is present. FINDINGS  Left Ventricle: Left ventricular ejection fraction, by estimation, is 60 to 65%. The left ventricle has normal function. The left ventricle has no regional wall motion abnormalities. Definity contrast agent was given IV to delineate the left ventricular  endocardial borders. The left ventricular internal cavity size was normal in size. There is no left ventricular hypertrophy. Left ventricular diastolic parameters were normal. Right Ventricle: The right ventricular size is normal. Right vetricular wall thickness was not well visualized. Right ventricular systolic function is normal. Left Atrium: Left atrial size was normal in size. Right Atrium: There is a large mass in the Right atrium. Possibilities include atrial myxoma or thrombus. Suggest TEE for better views of this mass. Right atrial size was normal in size. Pericardium: There is no evidence of pericardial effusion. Mitral Valve: The mitral valve is grossly normal. No evidence of mitral valve regurgitation. Tricuspid Valve: The tricuspid valve is grossly normal. Tricuspid valve regurgitation is mild. Aortic Valve: The aortic valve is grossly normal. There is mild calcification of the aortic valve. Aortic valve regurgitation is not visualized. No aortic stenosis is present. Pulmonic Valve: The pulmonic valve was grossly normal. Pulmonic valve regurgitation is not visualized. Aorta: The aortic root and ascending aorta are structurally normal, with no evidence of dilitation. IAS/Shunts: The atrial septum is grossly normal.  LEFT VENTRICLE PLAX 2D LVIDd:         5.20 cm  Diastology LVIDs:         3.90 cm      LV e' medial:    6.15 cm/s LV PW:         0.80 cm      LV E/e' medial:  10.7 LV IVS:        0.80 cm      LV e' lateral:   8.03 cm/s LVOT diam:     2.10 cm      LV E/e' lateral: 8.2 LVOT Area:     3.46 cm  LV Volumes (MOD) LV vol d, MOD A2C: 95.4 ml LV vol d, MOD A4C: 104.0 ml LV  vol s, MOD A2C: 48.4 ml LV vol s, MOD A4C: 48.5 ml LV SV MOD A2C:     47.0 ml LV SV MOD A4C:     104.0 ml LV SV MOD BP:      52.7 ml RIGHT VENTRICLE RV S prime:     9.15 cm/s TAPSE (M-mode): 2.0 cm LEFT ATRIUM           Index       RIGHT ATRIUM           Index LA diam:      3.10 cm 1.55 cm/m  RA Area:     21.20 cm LA Vol (A2C): 34.6 ml 17.27 ml/m RA Volume:   65.20 ml  32.55 ml/m LA Vol (A4C): 29.7 ml 14.83 ml/m   AORTA Ao Root diam: 3.40 cm Ao Asc diam:  3.50 cm MITRAL VALVE MV Area (PHT): 3.60 cm    SHUNTS MV Decel Time: 211 msec    Systemic Diam: 2.10 cm MV E velocity: 66.00 cm/s MV A velocity: 62.60 cm/s MV E/A ratio:  1.05 Mertie Moores MD Electronically signed by Mertie Moores MD Signature Date/Time: 06/16/2021/12:05:49 PM    Final    ECHO TEE  Result Date: 06/24/2021    TRANSESOPHOGEAL ECHO REPORT   Patient Name:   Trevor [name] Date of Exam: 06/21/2021 Medical Rec #:  [id number]     Height:       71.0 in Accession #:    [id number]    Weight:       175.0 lb Date of Birth:  07-Jun-1946     BSA:          1.992 m Patient Age:    84 years      BP:           145/55 mmHg Patient Gender: M             HR:           75 bpm. Exam Location:  Inpatient Procedure: Transesophageal Echo, Cardiac Doppler and Color Doppler Indications:     Right atrial myxoma  History:         Patient has prior history of Echocardiogram examinations, most                  recent 06/16/2021. COPD, Signs/Symptoms:ETOH abuse, Hepatic mass,                  RA myxoma; Risk Factors:Hypertension, Diabetes, Dyslipidemia                  and Current Smoker.  Sonographer:     Dustin Flock RDCS Referring Phys:  [place] Diagnosing Phys: Dixie Dials MD PROCEDURE: The transesophogeal probe was passed without difficulty through the esophogus of the patient. Sedation performed by performing physician.  The patient was monitored while under deep sedation. Anesthestetic sedation was provided intravenously by  Anesthesiology: 166.55mg  of  Propofol, 100mg  of Lidocaine. The patient developed no complications during the procedure. IMPRESSIONS  1. Left ventricular ejection fraction, by estimation, is 60 to 65%. The left ventricle has normal function. The left ventricle has no regional wall motion abnormalities. Left ventricular diastolic function could not be evaluated.  2. Right ventricular systolic function is normal. The right ventricular size is normal.  3. Left atrial size was mild to moderately dilated. No left atrial/left atrial appendage thrombus was detected.  4. Right atrial size was severely dilated.  5. The mitral valve is normal in structure. Mild to moderate mitral valve regurgitation.  6. The aortic valve is tricuspid. There is moderate calcification of the aortic valve. There is mild thickening of the aortic valve. Aortic valve regurgitation is mild. No aortic stenosis is present.  7. There is mild (Grade II) atheroma plaque involving the ascending aorta and descending aorta.  8. The inferior vena cava is normal in size with greater than 50% respiratory variability, suggesting right atrial pressure of 3 mmHg.  9. Agitated saline contrast bubble study was negative, with no evidence of any interatrial shunt. FINDINGS  Left Ventricle: Left ventricular ejection fraction, by estimation, is 60 to 65%. The left ventricle has normal function. The left ventricle has no regional wall motion abnormalities. The left ventricular internal cavity size was normal in size. There is  no left ventricular hypertrophy. Left ventricular diastolic function could not be evaluated. Right Ventricle: The right ventricular size is normal. No increase in right ventricular wall thickness. Right ventricular systolic function is normal. Left Atrium: Left atrial size was mild to moderately dilated. Spontaneous echo contrast was present in the left atrium. No left atrial/left atrial appendage thrombus was detected. Right Atrium: Large mobile 6 x 4 cm mass attached to IA  septum. Right atrial size was severely dilated. Pericardium: There is no evidence of pericardial effusion. Mitral Valve: The mitral valve is normal in structure. There is mild thickening of the mitral valve leaflet(s). There is mild calcification of the mitral valve leaflet(s). Mild mitral annular calcification. Mild to moderate mitral valve regurgitation, with centrally-directed jet. Tricuspid Valve: The tricuspid valve is normal in structure. Tricuspid valve regurgitation is trivial. Aortic Valve: The aortic valve is tricuspid. There is moderate calcification of the aortic valve. There is mild thickening of the aortic valve. There is mild aortic valve annular calcification. Aortic valve regurgitation is mild. No aortic stenosis is present. Aortic valve peak gradient measures 8.4 mmHg. Pulmonic Valve: The pulmonic valve was normal in structure. Pulmonic valve regurgitation is trivial. Aorta: The aortic root is normal in size and structure. There is mild (Grade II) atheroma plaque involving the ascending aorta and descending aorta. Venous: The left upper pulmonary vein, left lower pulmonary vein, right upper pulmonary vein and right lower pulmonary vein are normal. The inferior vena cava is normal in size with greater than 50% respiratory variability, suggesting right atrial pressure of 3 mmHg. IAS/Shunts: No atrial level shunt detected by color flow Doppler. Agitated saline contrast was given intravenously to evaluate for intracardiac shunting. Agitated saline contrast bubble study was negative, with no evidence of any interatrial shunt. There  is no evidence of a patent foramen ovale.  AORTIC VALVE AV Vmax:      145.00 cm/s AV Peak Grad: 8.4 mmHg Dixie Dials MD Electronically signed by Dixie Dials MD Signature Date/Time: 06/24/2021/4:08:45 PM    Final  VAS US DOPPLER PRE CABG  Result Date: 06/22/2021 PREOPERATIVE VASCULAR EVALUATION Patient Name:  Trevor [name]  Date of Exam:   06/22/2021 Medical Rec #:  [id number]      Accession #:    [id number] Date of Birth: 05-Feb-1946      Patient Gender: M Patient Age:   74Y Exam Location:  [facility] Procedure:      VAS US DOPPLER PRE CABG Referring Phys: [id number] [place] --------------------------------------------------------------------------------  Indications:      Pre-CABG. Risk Factors:     Hypertension, hyperlipidemia, Diabetes, past history of                   smoking, coronary artery disease. Comparison Study: No prior studies. Performing Technologist: Darlin Coco RDMS,RVT Supporting Technologist: Rogelia Rohrer RVT, RDMS  Examination Guidelines: A complete evaluation includes B-mode imaging, spectral Doppler, color Doppler, and power Doppler as needed of all accessible portions of each vessel. Bilateral testing is considered an integral part of a complete examination. Limited examinations for reoccurring indications may be performed as noted.  Right Carotid Findings: +----------+-------+-------+--------+---------------------------------+--------+           PSV    EDV    StenosisDescribe                         Comments           cm/s   cm/s                                                     +----------+-------+-------+--------+---------------------------------+--------+ CCA Prox  64     11                                                       +----------+-------+-------+--------+---------------------------------+--------+ CCA Distal66     12                                                       +----------+-------+-------+--------+---------------------------------+--------+ ICA Prox  306    70     60-79%  calcific, heterogenous and                                                irregular                                 +----------+-------+-------+--------+---------------------------------+--------+ ICA Mid   99     22                                                        +----------+-------+-------+--------+---------------------------------+--------+ ICA Distal76     18                                                       +----------+-------+-------+--------+---------------------------------+--------+  ECA       107                   heterogenous                              +----------+-------+-------+--------+---------------------------------+--------+ Portions of this table do not appear on this page. +----------+--------+-------+---------+------------+           PSV cm/sEDV cmsDescribe Arm Pressure +----------+--------+-------+---------+------------+ Subclavian73             Turbulent             +----------+--------+-------+---------+------------+ +---------+--------+--+--------+--+---------+ VertebralPSV cm/s82EDV cm/s21Antegrade +---------+--------+--+--------+--+---------+ Left Carotid Findings: +----------+--------+--------+--------+-------------------------+--------+           PSV cm/sEDV cm/sStenosisDescribe                 Comments +----------+--------+--------+--------+-------------------------+--------+ CCA Prox  86      12                                                +----------+--------+--------+--------+-------------------------+--------+ CCA Distal60      12                                                +----------+--------+--------+--------+-------------------------+--------+ ICA Prox  94      16      1-39%   heterogenous and calcific         +----------+--------+--------+--------+-------------------------+--------+ ICA Distal81      22                                                +----------+--------+--------+--------+-------------------------+--------+ ECA       151                     heterogenous and calcific         +----------+--------+--------+--------+-------------------------+--------+ +----------+--------+--------+----------------+------------+ SubclavianPSV cm/sEDV  cm/sDescribe        Arm Pressure +----------+--------+--------+----------------+------------+           122             Multiphasic, WNL             +----------+--------+--------+----------------+------------+ +---------+--------+--+--------+-+-----------------------------------+ VertebralPSV cm/s16EDV cm/s5Antegrade and limited visualization +---------+--------+--+--------+-+-----------------------------------+  ABI Findings: +---------+------------------+-----+----------+--------+ Right    Rt Pressure (mmHg)IndexWaveform  Comment  +---------+------------------+-----+----------+--------+ Brachial 130                    triphasic          +---------+------------------+-----+----------+--------+ PTA      132               0.96 monophasic         +---------+------------------+-----+----------+--------+ DP       155               1.12 triphasic          +---------+------------------+-----+----------+--------+ Great Toe66                0.48 Abnormal           +---------+------------------+-----+----------+--------+ +---------+------------------+-----+----------+-------+ Left  Lt Pressure (mmHg)IndexWaveform  Comment +---------+------------------+-----+----------+-------+ Brachial 138                    triphasic         +---------+------------------+-----+----------+-------+ PTA      0                 0.00 absent            +---------+------------------+-----+----------+-------+ DP       130               0.94 monophasic        +---------+------------------+-----+----------+-------+ Great Toe68                0.49 Abnormal          +---------+------------------+-----+----------+-------+  Right Doppler Findings: +--------+--------+-----+---------+--------+ Site    PressureIndexDoppler  Comments +--------+--------+-----+---------+--------+ Brachial130          triphasic         +--------+--------+-----+---------+--------+ Radial                triphasic         +--------+--------+-----+---------+--------+ Ulnar                triphasic         +--------+--------+-----+---------+--------+  Left Doppler Findings: +--------+--------+-----+---------+--------+ Site    PressureIndexDoppler  Comments +--------+--------+-----+---------+--------+ [id number]          triphasic         +--------+--------+-----+---------+--------+ Radial               triphasic         +--------+--------+-----+---------+--------+ Ulnar                triphasic         +--------+--------+-----+---------+--------+  Summary: Right Carotid: Velocities in the right ICA are consistent with a 60-79%                stenosis. Left Carotid: Velocities in the left ICA are consistent with a 1-39% stenosis. Vertebrals:  Bilateral vertebral arteries demonstrate antegrade flow. Subclavians: Normal flow hemodynamics were seen in bilateral subclavian              arteries. Right ABI: Resting right ankle-brachial index is within normal range. No evidence of significant right lower extremity arterial disease. The right toe-brachial index is abnormal. Left ABI: Resting left ankle-brachial index indicates mild left lower extremity arterial disease. The left toe-brachial index is abnormal. Right Upper Extremity: Doppler waveforms remain within normal limits with right radial compression. Doppler waveforms remain within normal limits with right ulnar compression. Left Upper Extremity: Doppler waveforms remain within normal limits with left radial compression. Doppler waveform obliterate with left ulnar compression.  Electronically signed by Monica Martinez MD on 06/22/2021 at 5:51:55 PM.    Final     ASSESSMENT & PLAN Trevor [name] is a 75 y.o. male who presents to the clinic for newly diagnosed hepatocellular carcinoma. I reviewed presenting symptoms, pathology report and diagnostic imaging. Based on review of recent CT and MRI imaging, there is a large  hepatic mass involving segment 7, 8, 4A, and 4B. We reviewed treatment options including surgery, liver directed therapies and systemic therapies. Due to size of hepatic mass, multifocal lesions and borderline enlarged upper abdominal lymph nodes, we don't not believe surgery or liver transplant are not indicated feasible options. We will reach out to interventional radiology to see if there is a role for liver directed therapies.   Based on  recent labs, patient has a Child-Pugh Score B so patient would be eligible for systemic therapies including Sorafenib and Nivolumab. We will repeat labs today to check CBC, CMP, PT/INR, AFP and ammonia levels.   #Hepatocellular carcinoma: --MR from 06/17/2021 revealed a large hyperenhancing mass involving segment 7, 8, 4 a, and 4B. This measures 14.4 by 9.4 by 9.4 cm. Several additional, smaller satellite lesions are identified which also exhibit arterial phase enhancement including a 1.3 cm lesion in the periphery of the right hepatic lobe.Borderline enlarged upper abdominal lymph nodes --AFP level was 646 on 07/06/21.  --Child Pugh Score B. Meld Score 9 --Will request IR to evaluate for liver directed therapies.  --Don't believe a candidate for surgery/transplant due to size of predominant mass, multifocal lesions and potential extrahepatic lymphadenopathy.  --Based on above Child Pugh Score, patient is a candidate for systemic therapy so recommend Sorafenib 200 mg twice daily pending today's labs. Will escalate dose of Sorafenib based on tolerance.  --Reviewed our goal is palliative which includes to prolong life and to help with cancer related symptoms. Unfortunately, this is an incurable malignancy.  --Labs today to check CBC, CMP, PT/INR, AFP and ammonia levels.  --RTC in 2 weeks with labs.   #Right atrial mass: --Seen on echocardiograms from 06/16/2021 and 06/21/2021.  --Additional echo findings included moderate mitral regurgitation with preserved LV systolic  function and severe aortic atherosclerosis. --Evaluated by cardiothoracic surgery on 06/21/2021 who did no recommend urgent surgery.  --Not likely a metastasis but can only confirm with tissue biopsy. Extremely unusual site for metastasis in [place].  #Diffuse Pruritis: --Concerned this is secondary to underlying liver disease and potentially hyperbilirubinemia. --Sent prescription for Atarax and hydrocortisone cream.   #Weight loss: --Recommend to eat small, frequent meals. Supplement with protein shakes --Will send referral to nutritional team for additional recommendations.   #Mentation: --Patient was alert and oriented during visit however, patient's wife and daughter in law feel that his mental status has changed recently. This includes decreased response and difficulty with orientation to place.  --Will check ammonia levels to rule out encephalopathy.   Orders Placed This Encounter  Procedures   CBC with Differential ([place] Only)    Standing Status:   Future    Number of Occurrences:   1    Standing Expiration Date:   06/28/2022   CMP ([place] only)    Standing Status:   Future    Number of Occurrences:   1    Standing Expiration Date:   06/28/2022   Protime-INR    Standing Status:   Future    Number of Occurrences:   1    Standing Expiration Date:   06/28/2022   AFP tumor marker    Standing Status:   Future    Number of Occurrences:   1    Standing Expiration Date:   06/28/2022   Ambulatory Referral to [facility] Nutrition    Referral Priority:   Routine    Referral Type:   Consultation    Referral Reason:   Specialty Services Required    Number of Visits Requested:   1    All questions were answered. The patient knows to call the clinic with any problems, questions or concerns.  I have spent a total of 60 minutes minutes of face-to-face and non-face-to-face time, preparing to see the patient, obtaining and/or reviewing separately obtained history, performing a  medically appropriate examination, counseling and educating the patient, ordering tests, referring and communicating with other  health care professionals, documenting clinical information in the electronic health record, and care coordination.   Dede Query, PA-C Department of Hematology/Oncology [place] at [facility] Phone: [phone]  Patient was seen with Dr. Lorenso Courier.   I have read the above note and personally examined the patient. I agree with the assessment and plan as noted above.  Briefly Trevor [name] is a 75 year old male who presents for evaluation of newly diagnosed [place].  He has a massive liver lesion which I do not believe would be amenable to surgical resection.  Additionally he has a Marcello Moores B score which would prevent him from undergoing therapy with lenvatinib, or atezolizumab and bevacizumab.  As such I would recommend that we reach out to IR for consideration of embolization, though given its robust size this may not be feasible.  If systemic therapy is required would recommend treatment with sorafenib.  Typically we start with 200 mg twice daily in order to assure tolerance, and the patient is able to do this for 2 weeks with escalate to the full dose of 400 mg twice daily.   Ledell Peoples, MD Department of Hematology/Oncology [place] at [facility] Phone: [phone] Pager: [phone] Email: [email or web address]

## 2021-06-29 ENCOUNTER — Telehealth: Payer: Self-pay | Admitting: Nutrition

## 2021-06-29 ENCOUNTER — Encounter: Payer: Self-pay | Admitting: General Practice

## 2021-06-29 DIAGNOSIS — L299 Pruritus, unspecified: Secondary | ICD-10-CM | POA: Insufficient documentation

## 2021-06-29 DIAGNOSIS — R634 Abnormal weight loss: Secondary | ICD-10-CM | POA: Insufficient documentation

## 2021-06-29 LAB — AFP TUMOR MARKER: AFP, Serum, Tumor Marker: 914 ng/mL — ABNORMAL HIGH (ref 0.0–8.4)

## 2021-06-29 NOTE — Progress Notes (Signed)
[place] Psychosocial Distress Screening Clinical Social Work  Clinical Social Work was referred by distress screening protocol.  The patient scored a 6 on the Psychosocial Distress Thermometer which indicates moderate distress. Clinical Social Worker contacted patient by phone to assess for distress and other psychosocial needs. "I knew there was something going on because there was pain every once in a while." Cancer was found when hospitalized for pneumonia.   He gets most of his care at the [place], he has been referred to [facility] for further care.    "Right now we are OK, kids are helping."  Both live nearby.  Wife is still working, wants to continue to work as long as possible.  CSW and patient discussed common feeling and emotions when being diagnosed with cancer, and the importance of support during treatment.  CSW informed patient of the support team and support services at [facility].  CSW provided contact information and encouraged patient to call with any questions or concerns.   ONCBCN DISTRESS SCREENING 06/28/2021  Distress experienced in past week (1-10) 6  Emotional problem type Nervousness/Anxiety;Adjusting to illness    Clinical Social Worker follow up needed: No.  If yes, follow up plan:  Beverely Pace, [place], LCSW Clinical Social Worker Phone:  [phone]

## 2021-06-29 NOTE — Telephone Encounter (Signed)
Scheduled appointment per 07/14 sch msg. Left message. 

## 2021-06-30 ENCOUNTER — Telehealth: Payer: Self-pay | Admitting: Physician Assistant

## 2021-06-30 ENCOUNTER — Telehealth: Payer: Self-pay | Admitting: *Deleted

## 2021-06-30 NOTE — Telephone Encounter (Signed)
I spoke to [name] daughter in law, Trevor [name] to review lab results from 06/28/2021 and treatment recommendations.  I explained that patient's liver function is stable the Child Pugh score B.  We will place a referral to interventional radiology to evaluate for liver directed therapies after initial evaluation Dr. Laurence Ferrari.  Systemic therapy recommendation will be sorafenib 200 mg twice daily.  I briefly went over potential side effects of staph and including fatigue, loss of appetite, nausea, vomiting, oral mucositis and diarrhea.   Trevor [name] will discuss recommendations with the patient and his wife.  At this time she asked for [place] to move forward with a referral to interventional radiology and initiate the process to prescribe sorafenib.  Will follow up with the patient next week with updates.

## 2021-06-30 NOTE — Telephone Encounter (Signed)
Received vm message from pt's daughter-in-law, Adonis Brook. She is calling about pt's lab results from his visit here on 06/28/21  They would appreciate a call back @ [phone]

## 2021-07-04 ENCOUNTER — Other Ambulatory Visit (HOSPITAL_COMMUNITY): Payer: Self-pay

## 2021-07-04 ENCOUNTER — Other Ambulatory Visit: Payer: Self-pay | Admitting: Physician Assistant

## 2021-07-04 ENCOUNTER — Other Ambulatory Visit: Payer: Self-pay | Admitting: Hematology and Oncology

## 2021-07-04 ENCOUNTER — Telehealth: Payer: Self-pay | Admitting: Pharmacist

## 2021-07-04 DIAGNOSIS — C220 Liver cell carcinoma: Secondary | ICD-10-CM

## 2021-07-04 MED ORDER — SORAFENIB TOSYLATE 200 MG PO TABS
200.0000 mg | ORAL_TABLET | Freq: Two times a day (BID) | ORAL | 1 refills | Status: DC
  Filled 2021-07-04: qty 28, 14d supply, fill #0

## 2021-07-04 NOTE — Telephone Encounter (Signed)
Oral Oncology Pharmacist Encounter  Received new prescription for Nexavar (sorafenib) for the treatment of hepatocellular carcinoma, planned duration until disease progression or unacceptable drug toxicity.  Prescription dose and frequency assessed for appropriateness. Per MD plan to dose escalate to full dose pending patient's tolerance.  CBC w/ Diff and CMP from 06/28/21 assessed, noted Scr 1.22 mg/dL (CrCl ~58 mL/min), LFTs elevated secondary to disease.  Current medication list in Epic reviewed, DDIs with Nexavar identified: Category C DDI between Nexavar and omeprazole - proton-pump inhibitors can decrease efficacy of Nexavar - will discuss with patient alternatives to omeprazole, such as H2RA's like famotidine while on Nexavar.  Evaluated chart and no patient barriers to medication adherence noted.   Prescription has been e-scribed to the [facility] for benefits analysis and approval.  Oral Oncology Clinic will continue to follow for insurance authorization, copayment issues, initial counseling and start date.  Leron Croak, PharmD, BCPS Hematology/Oncology Clinical Pharmacist [facility] [phone] 07/04/2021 8:55 AM

## 2021-07-05 ENCOUNTER — Telehealth: Payer: Self-pay | Admitting: *Deleted

## 2021-07-05 NOTE — Telephone Encounter (Signed)
Receive call from pt's daughter-in-law, Ladarrius Bogdanski.  She is calling to check on pt's Nexavar. Advised that oral chemo pharmacist is working on this and will continue to be in contact with them. Adonis Brook also states that her father-in-law woke up a bit confused this morning, also states he did not sleep well last night.  Confusion was slight. Advised her to keep checking on him and if it gets worse or doesn't go away to call back. She voiced understanding to the above.

## 2021-07-05 NOTE — Telephone Encounter (Signed)
Oral Chemotherapy Pharmacist Encounter   Spoke with patient's daughter in law today to follow up regarding patient's oral chemotherapy medication: Nexavar (sorafenib)  Explained process of proceeding with manufacturer assistance for Nexavar at this time since Nexavar can not be immediately filled at [facility] since formal consult/referral for outside heme/onc provider is not on file at the [place].   Spoke with representative at the [place] that stated prescription for Nexavar cannot be filled through their pharmacy unless patient's PCP at the [facility] places heme/onc referral. Patient's daughter in law expressed understanding and states she will call back if the family decides to proceed forward with Nexavar.   Oral oncology clinic phone number provided to daughter in law.   Leron Croak, PharmD, BCPS Hematology/Oncology Clinical Pharmacist [facility] [phone] 07/05/2021 3:37 PM

## 2021-07-06 ENCOUNTER — Inpatient Hospital Stay: Payer: Medicare Other | Admitting: Nutrition

## 2021-07-06 ENCOUNTER — Other Ambulatory Visit (HOSPITAL_COMMUNITY): Payer: Self-pay

## 2021-07-07 ENCOUNTER — Other Ambulatory Visit (HOSPITAL_COMMUNITY): Payer: Self-pay

## 2021-07-08 ENCOUNTER — Other Ambulatory Visit (HOSPITAL_COMMUNITY): Payer: Self-pay

## 2021-07-11 ENCOUNTER — Telehealth: Payer: Self-pay | Admitting: *Deleted

## 2021-07-11 ENCOUNTER — Ambulatory Visit
Admission: RE | Admit: 2021-07-11 | Discharge: 2021-07-11 | Disposition: A | Discharge status: Home / Self Care | Payer: Medicare Other | Source: Ambulatory Visit | Attending: Physician Assistant | Admitting: Physician Assistant

## 2021-07-11 ENCOUNTER — Encounter: Payer: Self-pay | Admitting: *Deleted

## 2021-07-11 DIAGNOSIS — C220 Liver cell carcinoma: Secondary | ICD-10-CM

## 2021-07-11 HISTORY — PX: IR RADIOLOGIST EVAL & MGMT: IMG5224

## 2021-07-11 NOTE — Consult Note (Signed)
Chief Complaint: Patient was seen in consultation today for hepatocellular cancer at the request of Trevor [name]  Referring Physician(s): Trevor [name]  History of Present Illness: Trevor [name] is a 75 y.o. male who presents to discuss options for liver directed therapy at the kind request of Dr. Lorenso [name].  Trevor [name] presented with several months of profound unintended weight loss and fatigue.  Imaging workup confirmed a large hepatic mass as well as an intracardiac mass.  His liver mass has imaging characteristics c/w HCC and was also biopsied confirming HCC.  His AFP is > 900.   His intra-cardiac mass was evaluated by ECHO and is 6x4 cm and adherent to the atrial wall.  Its unclear if this is an atrial myxoma or intracardiac metastasis.    Trevor [name] presents with his daughter in law and his wife and other daughter in law are listening via speaker phone as well.  He has been having intermittent RUQ pains and has been very fatigued.  If he doesn'[name] have activities planned. He spends nearly half the day sleeping.  His appetite remains good.  He's lost nearly 40 lbs over the past 2 months.    Past Medical History:  Diagnosis Date   BPH (benign prostatic hyperplasia)    COPD (chronic obstructive pulmonary disease) (HCC)    COPD (chronic obstructive pulmonary disease) ([place])    COVID-19    Diabetes mellitus without complication (HCC)    GERD (gastroesophageal reflux disease)    Hypercholesterolemia    Hypertension    Seizures ([place])    Type II or unspecified type diabetes mellitus without mention of complication, not stated as uncontrolled     Past Surgical History:  Procedure Laterality Date   BUBBLE STUDY  06/21/2021   Procedure: BUBBLE STUDY;  Surgeon: Dixie Dials, MD;  Location: [facility];  Service: Cardiovascular;;   HAND SURGERY Left 1953   IR RADIOLOGIST EVAL & MGMT  07/11/2021   IR US GUIDE BX ASP/DRAIN  06/20/2021   KNEE ARTHROSCOPY Left 1998   TEE WITHOUT  CARDIOVERSION N/A 06/21/2021   Procedure: TRANSESOPHAGEAL ECHOCARDIOGRAM (TEE);  Surgeon: Dixie Dials, MD;  [facility] ENDOSCOPY;  Service: Cardiovascular;  Laterality: N/A;    Allergies: Atorvastatin, Latex, Penicillin g, and Penicillins  Medications: Prior to Admission medications   Medication Sig Start Date End Date Taking? Authorizing Provider  albuterol (VENTOLIN HFA) 108 (90 Base) MCG/ACT inhaler Inhale 1-2 puffs into the lungs every 6 (six) hours as needed for wheezing or shortness of breath. 11/17/20   Scot Jun, FNP  ascorbic acid (VITAMIN C) 250 MG tablet Take 250 mg by mouth daily.    [provider]  aspirin 81 MG chewable tablet Chew 81 mg by mouth daily.    [provider]  Cholecalciferol (VITAMIN D3) 125 MCG (5000 UT) CAPS Take 5,000 Units by mouth daily.    [provider]  divalproex (DEPAKOTE ER) 500 MG 24 hr tablet Take 1 tablet (500 mg total) by mouth 2 (two) times daily. 10/11/17   Unk Pinto, MD  ferrous sulfate 325 (65 FE) MG tablet Take 325 mg by mouth daily with breakfast.    [provider]  finasteride (PROSCAR) 5 MG tablet Take 5 mg by mouth daily after supper.    [provider]  fluticasone (FLONASE) 50 MCG/ACT nasal spray Place 2 sprays into both nostrils daily. 12/05/15   Rolene Course, PA-C  Garlic 123XX123 MG CAPS Take 1,000 mg by mouth daily.  [provider]  hydrocortisone 1 % lotion Apply 1 application topically 2 (two) times daily. 06/28/21   Lincoln Brigham, PA-C  hydrOXYzine (ATARAX/VISTARIL) 10 MG tablet Take 1 tablet (10 mg total) by mouth 3 (three) times daily as needed for itching. 06/28/21   Lincoln Brigham, PA-C  ipratropium-albuterol (DUONEB) 0.5-2.5 (3) MG/3ML SOLN Take 3 mLs by nebulization every 6 (six) hours as needed (wheezing, shortness of breath). 03/16/19   Wieters, Hallie C, PA-C  lisinopril (PRINIVIL,ZESTRIL) 40 MG tablet Take 40 mg by mouth daily.    [provider]  loratadine (CLARITIN) 10 MG tablet Take 10 mg by mouth daily.    [provider]  Magnesium Oxide 420 MG TABS Take 420 mg by mouth 2 (two) times daily.    [provider]  metFORMIN (GLUCOPHAGE) 1000 MG tablet Take 1 tablet (1,000 mg total) by mouth 2 (two) times daily with a meal. 06/23/21 07/23/21  Axel Filler, MD  metoprolol tartrate (LOPRESSOR) 50 MG tablet Take 25 mg by mouth 2 (two) times daily. Hold dose for sbp less than 100 or pulse less than 50    [provider]  Omega-3 Fatty Acids (FISH OIL) 1000 MG CAPS Take 1,000 mg by mouth daily.    [provider]  omeprazole (PRILOSEC) 20 MG capsule Take 1 capsule (20 mg total) by mouth daily. 06/23/21   Axel Filler, MD  OVER THE COUNTER MEDICATION Apply 1 application topically as needed (itching). [place] anti-itch cream    [provider]  oxyCODONE (OXY IR/ROXICODONE) 5 MG immediate release tablet Take 1 tablet (5 mg total) by mouth every 4 (four) hours as needed for severe pain or moderate pain. 06/22/21   Marianna Payment, MD  rosuvastatin (CRESTOR) 10 MG tablet Take 1 tablet (10 mg total) by mouth at bedtime. 06/22/21 07/22/21  Marianna Payment, MD  SORAfenib (NEXAVAR) 200 MG tablet Take 1 tablet (200 mg total) by mouth 2 (two) times daily. Give on an empty stomach 1 hour before or 2 hours after meals. 07/04/21   Orson Slick, MD     Family History  Problem Relation Age of Onset   CVA Mother    Thyroid disease Mother    Heart disease Father    Diabetes Sister    Heart disease Sister    Diabetes Brother    Heart disease Brother    Epilepsy Brother    Schizophrenia Brother    Cancer Maternal Grandfather    Cancer Maternal Aunt     Social History   Socioeconomic History   Marital status: Married    Spouse name: Not on file   Number of children: Not on file   Years of education: Not on file   Highest education level: Not on file  Occupational History   Not  on file  Tobacco Use   Smoking status: Former    Packs/day: 1.00    Years: 50.00    Pack years: 50.00    Types: Cigarettes    Quit date: 12/17/2013    Years since quitting: 7.5   Smokeless tobacco: Never  Vaping Use   Vaping Use: Never used  Substance and Sexual Activity   Alcohol use: Not Currently   Drug use: No   Sexual activity: Not on file  Other Topics Concern   Not on file  Social History Narrative   Not on file   Social Determinants of Health   Financial Resource Strain: Low Risk  Difficulty of Paying Living Expenses: Not hard at all  Food Insecurity: No Food Insecurity   Worried About [place] in the Last Year: Never true   Ran Out of Food in the Last Year: Never true  Transportation Needs: No Transportation Needs   Lack of Transportation (Medical): No   Lack of Transportation (Non-Medical): No  Physical Activity: Not on file  Stress: No Stress Concern Present   Feeling of Stress : Only a little  Social Connections: Moderately Integrated   Frequency of Communication with Friends and Family: More than three times a week   Frequency of Social Gatherings with Friends and Family: More than three times a week   Attends Religious Services: More than 4 times per year   Active Member of [employer] or Organizations: No   Attends Archivist Meetings: Never   Marital Status: Married    ECOG Status: 2 - Symptomatic, <50% confined to bed  Review of Systems: A 12 point ROS discussed and pertinent positives are indicated in the HPI above.  All other systems are negative.  Review of Systems  Vital Signs: BP (!) 160/74 (BP Location: Left Arm)   Pulse 98   SpO2 98%   Physical Exam Constitutional:      General: He is not in acute distress.    Appearance: Normal appearance.  HENT:     Head: Normocephalic and atraumatic.  Eyes:     General: No scleral icterus. Cardiovascular:     Rate and Rhythm: Normal rate.  Pulmonary:     Effort: Pulmonary  effort is normal.  Abdominal:     General: Abdomen is flat.     Palpations: Abdomen is soft.  Skin:    General: Skin is warm and dry.  Neurological:     Mental Status: He is alert and oriented to person, place, and time.       Labs:  CBC: Recent Labs    06/17/21 0249 06/18/21 0323 06/19/21 0345 06/28/21 1508  WBC 11.8* 11.8* 11.6* 9.8  HGB 9.0* 9.2* 9.4* 11.2*  HCT 29.5* 30.1* 30.9* 37.2*  PLT 460* 447* 421* 388    COAGS: Recent Labs    06/28/21 1508  INR 0.9    BMP: Recent Labs    06/16/21 0126 06/18/21 0323 06/19/21 0345 06/28/21 1508  NA 140 139 137 140  K 4.0 3.8 3.9 5.1  CL 108 106 109 105  CO2 22 21* 20* 26  GLUCOSE 202* 137* 93 216*  BUN '15 9 11 15  '$ CALCIUM 9.0 8.8* 8.9 9.4  CREATININE 1.06 0.87 0.94 1.22  GFRNONAA >60 >60 >60 >60    LIVER FUNCTION TESTS: Recent Labs    06/15/21 0734 06/16/21 0126 06/28/21 1508  BILITOT 1.5* 1.2 1.2  AST 59* 48* 65*  ALT 51* 46* 55*  ALKPHOS 179* 156* 285*  PROT 6.7 5.7* 7.4  ALBUMIN 2.8* 2.2* 2.7*    TUMOR MARKERS: No results for input(s): AFPTM, CEA, CA199, CHROMGRNA in the last 8760 hours.  Assessment and Plan:  75 year-old gentleman with newly diagnosed large hepatocellular cancer (biopsy proven).  His tumor is primarily in the right hemiliver but does involve the medial segment of the left liver (segments 4a/4b).  He has lost significant weight over the past few months and his performance status is deteriorating.  He remains ECOG 2 at this time.  He has good hepatic reserve with Br <2 and is a candidate for liver directed therapy.  Based on the  size of his lesion and his age/performance status, transarterial radioembolization with Y90 would be the preferred treatment.  I believe the lesion is too large for bland embolization and I do not think he would tolerate chemoembolization.   I discussed the risks, benefits and alternatives to each of these therapies with a focus on TARE with Y90.  We also  discussed that he must go through the [place] first to get permission.  He has an appointment with them the first week of August.  He and his family are concerned that he needs to begin treatment sooner rather than later given his advanced lesion size and potentially poor prognosis.    After full discussion, he would like to proceed with TARE.  He will require a planning study, and depending on if the segment 4 artery arises from the right, or left hepatic artery, wither 1 or 2 treatment sessions. I would like to get the process started so that we can begin treatment as expeditiously as possible once approved by the [place].  He will also be beginning systemic therapy with Nexavar.   1.) Schedule for TARE planning study next week (if possible) to be followed by 1st treatment the week of 8/15.  If planning study cannot be scheduled next week, then please schedule for Monday 8/15 with treatment #1 later that same week on 8/18 or 8/19.     Thank you for this interesting consult.  I greatly enjoyed meeting ANTERIO DEMLOW and look forward to participating in their care.  A copy of this report was sent to the requesting provider on this date.  Electronically Signed: Criselda Peaches 07/11/2021, 10:20 AM   I spent a total of  60 Minutes  in face to face in clinical consultation, greater than 50% of which was counseling/coordinating care for hepatocellular cancer.

## 2021-07-11 NOTE — Telephone Encounter (Signed)
On behalf of request from Murray Hodgkins called daughter in law to see if patient had received his medication Nexavar.    Left message pending call back.

## 2021-07-12 ENCOUNTER — Other Ambulatory Visit (HOSPITAL_COMMUNITY): Payer: Self-pay | Admitting: Interventional Radiology

## 2021-07-12 DIAGNOSIS — C220 Liver cell carcinoma: Secondary | ICD-10-CM

## 2021-07-12 NOTE — Telephone Encounter (Signed)
Oral Chemotherapy Pharmacist Encounter   Spoke with patient's daughter in law regarding signing up for manufacturer assistance for [place]. Patient's family now agreeable to attempt to obtain drug at no cost through manufacturer assistance process. Patient's daughter in law Adonis Brook will bring patient by either 7/28 or 7/29 to sign forms to be faxed off to manufacturer.   Leron Croak, PharmD, BCPS Hematology/Oncology Clinical Pharmacist [facility] [phone] 07/12/2021 2:55 PM

## 2021-07-14 ENCOUNTER — Telehealth: Payer: Self-pay

## 2021-07-14 NOTE — Telephone Encounter (Signed)
Oral Oncology Patient Advocate Encounter  Met patient in lobby room to complete application for [employer] in an effort to reduce patient's out of pocket expense for Nexavar to $0.    Application completed and faxed to [phone].   [employer] patient assistance phone number for follow up is [phone].   This encounter will be updated until final determination.   [place] Patient [place] Phone [phone] Fax [phone] 07/14/2021 10:41 AM

## 2021-07-19 NOTE — Telephone Encounter (Signed)
Patient is approved for Nexavar at no cost from [facility] 07/19/21-12/16/21.  [employer] uses [employer] by [place] Patient [place] Phone [phone] Fax [phone] 07/19/2021 10:47 AM

## 2021-07-20 ENCOUNTER — Telehealth: Payer: Self-pay | Admitting: *Deleted

## 2021-07-20 NOTE — Telephone Encounter (Signed)
Received call from pt's daughter-in-law, Adonis Brook saying that the [place] has given the ok for pt to be seen here. She is asking for an appt. Advised that he already has an appt for 07/26/21@ 12:30 pm. She voiced understanding.

## 2021-07-20 NOTE — Telephone Encounter (Signed)
Oral Chemotherapy Pharmacist Encounter   Spoke with patient's daughter in law today to follow up regarding patient's oral chemotherapy medication: Nexavar (sorafenib)  Daughter in law aware that patient has been approved for Nexavar at no cost through manufacturer assistance Psychologist, occupational). She has been provided with the phone number to call and set up medication shipment to the patient's home [phone])  Patient's family knows to call the office with questions or concerns.  Leron Croak, PharmD, BCPS Hematology/Oncology Clinical Pharmacist [facility] [phone] 07/20/2021 12:36 PM

## 2021-07-21 ENCOUNTER — Other Ambulatory Visit: Payer: Self-pay | Admitting: Physician Assistant

## 2021-07-21 ENCOUNTER — Other Ambulatory Visit: Payer: Self-pay | Admitting: *Deleted

## 2021-07-21 ENCOUNTER — Telehealth: Payer: Self-pay | Admitting: *Deleted

## 2021-07-21 DIAGNOSIS — C220 Liver cell carcinoma: Secondary | ICD-10-CM

## 2021-07-21 MED ORDER — OXYCODONE HCL 5 MG PO TABS: 5.0000 mg | ORAL_TABLET | ORAL | 0 refills | Status: DC | PRN

## 2021-07-21 MED ORDER — SORAFENIB TOSYLATE 200 MG PO TABS: 200.0000 mg | ORAL_TABLET | Freq: Two times a day (BID) | ORAL | 1 refills | Status: AC

## 2021-07-21 MED ORDER — HYDROXYZINE HCL 10 MG PO TABS: 10.0000 mg | ORAL_TABLET | Freq: Three times a day (TID) | ORAL | 0 refills | Status: DC | PRN

## 2021-07-21 NOTE — Telephone Encounter (Signed)
Received call from pt's dau-in-law, Teddy Spike. She states pt needs refill of hydroxyzine and his oxycodone. While talking to her on the phone, advised that his oral chemo, Nexavar, will be coming from a specialty pharmacy via the mail.  Advised that the prescription was sent today. There will be no charge for this according to GI navigator, Ihor Gully.  Adonis Brook states pt is not doing as well-seems more confused. Advised that we will be seeing him next week and if she feels more comfortable waiting to start the Nexavar, she can wait until that appt.  Adonis Brook states she is more comfortable waiting. Dr. Lorenso Courier and Dede Query, PA made aware.

## 2021-07-21 NOTE — Telephone Encounter (Signed)
Dede Query, PA notified of refill needs

## 2021-07-24 ENCOUNTER — Telehealth: Payer: Self-pay | Admitting: *Deleted

## 2021-07-24 ENCOUNTER — Other Ambulatory Visit: Payer: Self-pay | Admitting: Physician Assistant

## 2021-07-24 MED ORDER — HYDROXYZINE HCL 10 MG PO TABS: 10.0000 mg | ORAL_TABLET | Freq: Three times a day (TID) | ORAL | 0 refills | Status: AC | PRN

## 2021-07-24 MED ORDER — OXYCODONE HCL 5 MG PO TABS: 5.0000 mg | ORAL_TABLET | ORAL | 0 refills | Status: AC | PRN

## 2021-07-24 NOTE — Telephone Encounter (Signed)
Received call from pt's daughter-in-law, Adonis Brook. She states that the [facility] never received the prescriptions that were sent in on 07/21/21. She asked if they could be sent to the [place] in [place]. Message sent to Dede Query, PA about this.  Adonis Brook is aware of pt's appt on 07/26/21

## 2021-07-24 NOTE — Telephone Encounter (Signed)
Oral Chemotherapy Pharmacist Encounter  I spoke with patient's daughter in law, Trevor [name], for overview of: Nexavar (sorafenib) for the treatment of metastatic hepatocellular carcinoma, planned duration until disease progression or unacceptable toxicity.   Counseled on administration, dosing, side effects, monitoring, drug-food interactions, safe handling, storage, and disposal.  Patient will take Nexavar '200mg'$  tablets, 1 tablet ('200mg'$ ) by mouth 2 times daily on an empty stomach, 1 hour before or 2 hours after meals.   Nexavar will be initiated on a dose titration schedule with planned target dose of '400mg'$  BID. Dose with be titrated up per MD discretion based on patient's tolerance.   Patient will separate Nexavar dosing by 10-12 hours each day.  Nexavar start date: pending to start once obtained from manufacturer - daughter in law to call and set up shipment 07/24/21.  Adverse effects include but are not limited to: hypertension, fatigue, hand-foot syndrome, skin rash, diarrhea, nausea, lab abnormalities, cardiac conduction changes, hypothyroidism, and wound healing complications.     Patient will obtain anti diarrheal and alert the office of 4 or more loose stools above baseline.  Reviewed importance of keeping a medication schedule and plan for any missed doses. No barriers to medication adherence identified.  Medication reconciliation performed and medication/allergy list updated. Discussed that patient will need to discontinue Prilosec (omeprazole) while on Nexavar. Discussed alternatives to this such as Pepcid (famotidine) or Tums PRN for acid reflux. Trevor [name] expressed understanding.  Patient approved for manufacturer assistance through [place] to receive medication at no cost. Daughter in law will call [place] to set up medication shipment.  All questions answered.  Trevor [name] voiced understanding and appreciation.   Medication education handout placed in mail for patient and  family. Patient's family knows to call the office with questions or concerns. Oral Chemotherapy Clinic phone number provided.   Leron Croak, PharmD, BCPS Hematology/Oncology Clinical Pharmacist [facility] [phone] 07/24/2021 11:40 AM

## 2021-07-26 ENCOUNTER — Inpatient Hospital Stay (HOSPITAL_BASED_OUTPATIENT_CLINIC_OR_DEPARTMENT_OTHER): Payer: Medicare Other | Admitting: Physician Assistant

## 2021-07-26 ENCOUNTER — Inpatient Hospital Stay: Payer: Medicare Other | Attending: Physician Assistant

## 2021-07-26 ENCOUNTER — Other Ambulatory Visit: Payer: Self-pay

## 2021-07-26 ENCOUNTER — Telehealth: Payer: Self-pay

## 2021-07-26 VITALS — BP 104/47 | HR 74 | Temp 96.6°F | Resp 18 | Wt 165.4 lb

## 2021-07-26 DIAGNOSIS — C220 Liver cell carcinoma: Secondary | ICD-10-CM | POA: Diagnosis not present

## 2021-07-26 DIAGNOSIS — T59891A Toxic effect of other specified gases, fumes and vapors, accidental (unintentional), initial encounter: Secondary | ICD-10-CM | POA: Diagnosis not present

## 2021-07-26 DIAGNOSIS — R634 Abnormal weight loss: Secondary | ICD-10-CM | POA: Insufficient documentation

## 2021-07-26 DIAGNOSIS — I340 Nonrheumatic mitral (valve) insufficiency: Secondary | ICD-10-CM | POA: Insufficient documentation

## 2021-07-26 DIAGNOSIS — G928 Other toxic encephalopathy: Secondary | ICD-10-CM

## 2021-07-26 DIAGNOSIS — L299 Pruritus, unspecified: Secondary | ICD-10-CM | POA: Diagnosis not present

## 2021-07-26 DIAGNOSIS — I700 Atherosclerosis of aorta: Secondary | ICD-10-CM | POA: Diagnosis not present

## 2021-07-26 LAB — CBC WITH DIFFERENTIAL (CANCER CENTER ONLY)
Abs Immature Granulocytes: 0.06 10*3/uL (ref 0.00–0.07)
Basophils Absolute: 0.1 10*3/uL (ref 0.0–0.1)
Basophils Relative: 1 %
Eosinophils Absolute: 0.1 10*3/uL (ref 0.0–0.5)
Eosinophils Relative: 1 %
HCT: 39 % (ref 39.0–52.0)
Hemoglobin: 12.5 g/dL — ABNORMAL LOW (ref 13.0–17.0)
Immature Granulocytes: 1 %
Lymphocytes Relative: 18 %
Lymphs Abs: 2.1 10*3/uL (ref 0.7–4.0)
MCH: 29.1 pg (ref 26.0–34.0)
MCHC: 32.1 g/dL (ref 30.0–36.0)
MCV: 90.7 fL (ref 80.0–100.0)
Monocytes Absolute: 0.9 10*3/uL (ref 0.1–1.0)
Monocytes Relative: 8 %
Neutro Abs: 8.5 10*3/uL — ABNORMAL HIGH (ref 1.7–7.7)
Neutrophils Relative %: 71 %
Platelet Count: 340 10*3/uL (ref 150–400)
RBC: 4.3 MIL/uL (ref 4.22–5.81)
RDW: 22.2 % — ABNORMAL HIGH (ref 11.5–15.5)
WBC Count: 11.8 10*3/uL — ABNORMAL HIGH (ref 4.0–10.5)
nRBC: 0 % (ref 0.0–0.2)

## 2021-07-26 LAB — CMP (CANCER CENTER ONLY)
ALT: 37 U/L (ref 0–44)
AST: 53 U/L — ABNORMAL HIGH (ref 15–41)
Albumin: 2.2 g/dL — ABNORMAL LOW (ref 3.5–5.0)
Alkaline Phosphatase: 367 U/L — ABNORMAL HIGH (ref 38–126)
Anion gap: 11 (ref 5–15)
BUN: 19 mg/dL (ref 8–23)
CO2: 20 mmol/L — ABNORMAL LOW (ref 22–32)
Calcium: 9.6 mg/dL (ref 8.9–10.3)
Chloride: 106 mmol/L (ref 98–111)
Creatinine: 1.16 mg/dL (ref 0.61–1.24)
GFR, Estimated: >60 mL/min (ref >60)
Glucose, Bld: 176 mg/dL — ABNORMAL HIGH (ref 70–99)
Potassium: 4.4 mmol/L (ref 3.5–5.1)
Sodium: 137 mmol/L (ref 135–145)
Total Bilirubin: 3.7 mg/dL (ref 0.3–1.2)
Total Protein: 6.9 g/dL (ref 6.5–8.1)

## 2021-07-26 LAB — AMMONIA: Ammonia: 46 umol/L — ABNORMAL HIGH (ref 9–35)

## 2021-07-26 MED ORDER — ONDANSETRON HCL 8 MG PO TABS: 8.0000 mg | ORAL_TABLET | Freq: Three times a day (TID) | ORAL | 0 refills | Status: AC | PRN

## 2021-07-26 MED ORDER — LACTULOSE 10 G PO PACK: 10.0000 g | PACK | Freq: Every day | ORAL | 0 refills | Status: AC | PRN

## 2021-07-26 NOTE — Telephone Encounter (Signed)
CRITICAL VALUE STICKER  CRITICAL VALUE: Bili = 3.7  RECEIVER (on-site recipient of call): Yetta Glassman, CMA  DATE & TIME NOTIFIED: 07/26/21 at 1:30pm  MESSENGER (representative from lab): Suanne Marker  MD NOTIFIED: Dede Query, PA-C  TIME OF NOTIFICATION: 07/26/21 at 1:33pm  RESPONSE: Notification given to [facility], [facility] for follow-up with the provider.

## 2021-07-26 NOTE — Progress Notes (Signed)
[place] [phone]   Fax:([phone] [place] NOTE  Patient Care Team: [facility], [facility] as PCP - General Inda Castle, MD (Inactive) as Consulting Physician (Gastroenterology) Newt Minion, MD as Consulting Physician (Orthopedic Surgery) Garvin Fila, MD as Consulting Physician (Neurology) Orson Slick, MD as Consulting Physician (Oncology) Cordelia Poche as Physician Assistant (Oncology) Royston Bake, RN as Oncology Nurse Navigator  Hematological/Oncological History 1) 06/15/2021-06/22/2021: Admitted for community acquired pneumonia and newly found hepatic mass concerning for malignancy.  --06/15/2021: CT abdomen/pelvis: Large, somewhat ill-defined, masslike heterogeneous lesion of the anterior right lobe of the liver, with areas of heterogeneous hypodensity and capsular retraction, the central, dominant component measuring approximately 11.7 x 6.8 cm --06/16/2021: Echocardiogram: Large mass in the right atrium. --06/17/2021: MR Liver: Large arterial phase enhancing mass involving segment 7, 8, 4A and 4B, which shows washout on delayed images. Right atrial mass, indeterminate.Mild splenomegaly. Trace bilateral pleural effusions and small volume of ascites.Borderline enlarged upper abdominal lymph nodes are identified. --06/19/2021: CT chest: Patchy small densities in [name] lower lobes. Some of these densities have a tree-in-bud configuration and findings are suggestive for an infectious or inflammatory process. Small amount of consolidation in the medial right lower lobe.No clear evidence for neoplasm or metastatic disease within the chest. Borderline sized mediastinal lymph nodes are indeterminate.In addition, there is an indeterminate 5 mm nodule at the right lung base. --06/20/2021: US guided liver biopsy. Pathology confirmed hepatocellular carcinoma.  --06/21/2021: Repeat Echocardiogram: Large mobile 6 x 4  cm mass attached to the IA septum.    2) 06/28/2021: Establish care with Hematology/Oncology    CHIEF COMPLAINTS/PURPOSE OF CONSULTATION:  Hepatocellular carcinoma  HISTORY OF PRESENTING ILLNESS:  Trevor [name] 75 y.o. male returns for a follow up for hepatocellular carcinoma. Patient is accompanied by his wife and daughter-in-law for this visit.   Since the last visit, patient reports progressive fatigue. He needs assistance to complete daily tasks including bathing and getting dressed. He has a poor appetite with continued weight loss. He has daily nausea with intermittent episodes of vomiting. He has rare episodes of RUQ pain which he takes oxycodone as needed. His bowel movements are more regular and he takes stool softeners as needed. He has dark stools but attributes that to oral iron supplementation. He denies easy bruising or signs of bleeding. He notes that diffuse pruritis has improved some since taking Hydroxyzine twice daily. He has noticed that his urine is very dark. Patient denies any fevers, chills, night sweats, shortness of breath, chest pain or cough. He has no other complaints. Rest of the 10 point ROS is below.   MEDICAL HISTORY:  Past Medical History:  Diagnosis Date   BPH (benign prostatic hyperplasia)    COPD (chronic obstructive pulmonary disease) (HCC)    COPD (chronic obstructive pulmonary disease) ([place])    COVID-19    Diabetes mellitus without complication (HCC)    GERD (gastroesophageal reflux disease)    Hypercholesterolemia    Hypertension    Seizures ([place])    Type II or unspecified type diabetes mellitus without mention of complication, not stated as uncontrolled     SURGICAL HISTORY: Past Surgical History:  Procedure Laterality Date   BUBBLE STUDY  06/21/2021   Procedure: BUBBLE STUDY;  Surgeon: Dixie Dials, MD;  Location: [facility];  Service: Cardiovascular;;   HAND SURGERY Left 1953   IR RADIOLOGIST EVAL & MGMT  07/11/2021   IR US  GUIDE BX  ASP/DRAIN  06/20/2021   KNEE ARTHROSCOPY Left 1998   TEE WITHOUT CARDIOVERSION N/A 06/21/2021   Procedure: TRANSESOPHAGEAL ECHOCARDIOGRAM (TEE);  Surgeon: Dixie Dials, MD;  Location: [facility] ENDOSCOPY;  Service: Cardiovascular;  Laterality: N/A;    SOCIAL HISTORY: Social History   Socioeconomic History   Marital status: Married    Spouse name: Not on file   Number of children: Not on file   Years of education: Not on file   Highest education level: Not on file  Occupational History   Not on file  Tobacco Use   Smoking status: Former    Packs/day: 1.00    Years: 50.00    Pack years: 50.00    Types: Cigarettes    Quit date: 12/17/2013    Years since quitting: 7.6   Smokeless tobacco: Never  Vaping Use   Vaping Use: Never used  Substance and Sexual Activity   Alcohol use: Not Currently   Drug use: No   Sexual activity: Not on file  Other Topics Concern   Not on file  Social History Narrative   Not on file   Social Determinants of Health   Financial Resource Strain: Low Risk    Difficulty of Paying Living Expenses: Not hard at all  Food Insecurity: No Food Insecurity   Worried About Charity fundraiser in the Last Year: Never true   [place] in the Last Year: Never true  Transportation Needs: No Transportation Needs   Lack of Transportation (Medical): No   Lack of Transportation (Non-Medical): No  Physical Activity: Not on file  Stress: No Stress Concern Present   Feeling of Stress : Only a little  Social Connections: Moderately Integrated   Frequency of Communication with Friends and Family: More than three times a week   Frequency of Social Gatherings with Friends and Family: More than three times a week   Attends Religious Services: More than 4 times per year   Active Member of [employer] or Organizations: No   Attends Music therapist: Never   Marital Status: Married  Human resources officer Violence: Not on file    FAMILY HISTORY: Family History  Problem  Relation Age of Onset   CVA Mother    Thyroid disease Mother    Heart disease Father    Diabetes Sister    Heart disease Sister    Diabetes Brother    Heart disease Brother    Epilepsy Brother    Schizophrenia Brother    Cancer Maternal Grandfather    Cancer Maternal Aunt     ALLERGIES:  is allergic to atorvastatin, latex, penicillin g, and penicillins.  MEDICATIONS:  Current Outpatient Medications  Medication Sig Dispense Refill   albuterol (VENTOLIN HFA) 108 (90 Base) MCG/ACT inhaler Inhale 1-2 puffs into the lungs every 6 (six) hours as needed for wheezing or shortness of breath. 18 g 0   ascorbic acid (VITAMIN C) 250 MG tablet Take 250 mg by mouth daily.     aspirin 81 MG chewable tablet Chew 81 mg by mouth daily.     Cholecalciferol (VITAMIN D3) 125 MCG (5000 UT) CAPS Take 5,000 Units by mouth daily.     divalproex (DEPAKOTE ER) 500 MG 24 hr tablet Take 1 tablet (500 mg total) by mouth 2 (two) times daily. 180 tablet 1   ferrous sulfate 325 (65 FE) MG tablet Take 325 mg by mouth daily with breakfast.     finasteride (PROSCAR) 5 MG tablet Take  5 mg by mouth daily after supper.     fluticasone (FLONASE) 50 MCG/ACT nasal spray Place 2 sprays into [name] nostrils daily. 16 g 0   Garlic 123XX123 MG CAPS Take 1,000 mg by mouth daily.     hydrocortisone 1 % lotion Apply 1 application topically 2 (two) times daily. 118 mL 0   hydrOXYzine (ATARAX/VISTARIL) 10 MG tablet Take 1 tablet (10 mg total) by mouth 3 (three) times daily as needed for itching. 30 tablet 0   ipratropium-albuterol (DUONEB) 0.5-2.5 (3) MG/3ML SOLN Take 3 mLs by nebulization every 6 (six) hours as needed (wheezing, shortness of breath). 360 mL 0   lisinopril (PRINIVIL,ZESTRIL) 40 MG tablet Take 40 mg by mouth daily.     loratadine (CLARITIN) 10 MG tablet Take 10 mg by mouth daily.     Magnesium Oxide 420 MG TABS Take 420 mg by mouth 2 (two) times daily.     metoprolol tartrate (LOPRESSOR) 50 MG tablet Take 25 mg by mouth 2  (two) times daily. Hold dose for sbp less than 100 or pulse less than 50     Omega-3 Fatty Acids (FISH OIL) 1000 MG CAPS Take 1,000 mg by mouth daily.     ondansetron (ZOFRAN) 8 MG tablet Take 1 tablet (8 mg total) by mouth every 8 (eight) hours as needed for nausea or vomiting. 30 tablet 0   OVER THE COUNTER MEDICATION Apply 1 application topically as needed (itching). [place] anti-itch cream     oxyCODONE (OXY IR/ROXICODONE) 5 MG immediate release tablet Take 1 tablet (5 mg total) by mouth every 4 (four) hours as needed for severe pain or moderate pain. 30 tablet 0   metFORMIN (GLUCOPHAGE) 1000 MG tablet Take 1 tablet (1,000 mg total) by mouth 2 (two) times daily with a meal. 60 tablet 0   rosuvastatin (CRESTOR) 10 MG tablet Take 1 tablet (10 mg total) by mouth at bedtime. 30 tablet 0   SORAfenib (NEXAVAR) 200 MG tablet Take 1 tablet (200 mg total) by mouth 2 (two) times daily. Give on an empty stomach 1 hour before or 2 hours after meals. (Patient not taking: Reported on 07/26/2021) 28 tablet 1   No current facility-administered medications for this visit.    REVIEW OF SYSTEMS:   Constitutional: ( - ) fevers, ( - )  chills , ( - ) night sweats Eyes: ( - ) blurriness of vision, ( - ) double vision, ( - ) watery eyes Ears, nose, mouth, throat, and face: ( - ) mucositis, ( - ) sore throat Respiratory: ( - ) cough, ( - ) dyspnea, ( - ) wheezes Cardiovascular: ( - ) palpitation, ( - ) chest discomfort, ( + ) lower extremity swelling Gastrointestinal:  ( +) nausea, ( - ) heartburn, ( +  ) change in bowel habits Skin: ( - ) abnormal skin rashes Lymphatics: ( - ) new lymphadenopathy, ( - ) easy bruising Neurological: ( - ) numbness, ( - ) tingling, ( - ) new weaknesses Behavioral/Psych: ( - ) mood change, ( - ) new changes  All other systems were reviewed with the patient and are negative.  PHYSICAL EXAMINATION: ECOG PERFORMANCE STATUS: 3 - Symptomatic, >50% confined to bed  Vitals:    07/26/21 1308  BP: (!) 104/47  Pulse: 74  Resp: 18  Temp: (!) 96.6 F (35.9 C)  SpO2: 97%   Filed Weights   07/26/21 1308  Weight: 165 lb 6 oz (75 kg)    GENERAL: not  acute distress.  SKIN: skin color, texture, turgor are normal, no rashes. Diffuse excoriation in the upper and lower extremities, abdomen and trunk.  EYES: conjunctiva are pink and non-injected, mild sclera icteric.  OROPHARYNX: no exudate, no erythema; lips, buccal mucosa, and tongue normal  NECK: supple, non-tender LYMPH:  no palpable lymphadenopathy in the cervical, axillary or supraclavicular lymph nodes.  LUNGS: clear to auscultation and percussion with normal breathing effort HEART: regular rate & rhythm and no murmurs. Bilateral lower extremity edema ABDOMEN: soft, non-tender, normal bowel sounds. Mild distention.  Musculoskeletal: no cyanosis of digits and no clubbing  PSYCH: alert & oriented x 3, fluent speech NEURO: no focal motor/sensory deficits  LABORATORY DATA:  I have reviewed the data as listed CBC Latest Ref Rng & Units 07/26/2021 06/28/2021 06/19/2021  WBC 4.0 - 10.5 K/uL 11.8(H) 9.8 11.6(H)  Hemoglobin 13.0 - 17.0 g/dL 12.5(L) 11.2(L) 9.4(L)  Hematocrit 39.0 - 52.0 % 39.0 37.2(L) 30.9(L)  Platelets 150 - 400 K/uL 340 388 421(H)    CMP Latest Ref Rng & Units 07/26/2021 06/28/2021 06/19/2021  Glucose 70 - 99 mg/dL 176(H) 216(H) 93  BUN 8 - 23 mg/dL '19 15 11  '$ Creatinine 0.61 - 1.24 mg/dL 1.16 1.22 0.94  Sodium 135 - 145 mmol/L 137 140 137  Potassium 3.5 - 5.1 mmol/L 4.4 5.1 3.9  Chloride 98 - 111 mmol/L 106 105 109  CO2 22 - 32 mmol/L 20(L) 26 20(L)  Calcium 8.9 - 10.3 mg/dL 9.6 9.4 8.9  Total Protein 6.5 - 8.1 g/dL 6.9 7.4 -  Total Bilirubin 0.3 - 1.2 mg/dL 3.7(HH) 1.2 -  Alkaline Phos 38 - 126 U/L 367(H) 285(H) -  AST 15 - 41 U/L 53(H) 65(H) -  ALT 0 - 44 U/L 37 55(H) -     PATHOLOGY: SURGICAL PATHOLOGY  CASE: [id number]  Clinical History: right liver mass (cm)   FINAL MICROSCOPIC  DIAGNOSIS:   A. LIVER MASS, RIGHT, NEEDLE CORE BIOPSY:  - Hepatocellular carcinoma.  - See comment.   COMMENT:   The tumor is positive with HepPar 1 and arginase 1, CD34 highlights  vasculature and polyclonal CEA has a canalicular pattern.  The tumor is  negative with MOC31, cytokeratin 5/6 and cytokeratin 7.  The morphology  and immunophenotype are consistent with hepatocellular carcinoma.  RADIOGRAPHIC STUDIES: I have personally reviewed the radiological images as listed and agreed with the findings in the report. IR Radiologist Eval & Mgmt  Result Date: 07/11/2021 Please refer to notes tab for details about interventional procedure. (Op Note)   ASSESSMENT & PLAN Trevor [name] is a 75 y.o. male for a follow up for hepatocellular carcinoma.   #Hepatocellular carcinoma: --MR from 06/17/2021 revealed a large hyperenhancing mass involving segment 7, 8, 4 a, and 4B. This measures 14.4 by 9.4 by 9.4 cm. Several additional, smaller satellite lesions are identified which also exhibit arterial phase enhancement including a 1.3 cm lesion in the periphery of the right hepatic lobe.Borderline enlarged upper abdominal lymph nodes --Not a candidate for surgery/transplant due to size of predominant mass, multifocal lesions and potential extrahepatic lymphadenopathy.  --Patient was evaluated for liver directed therapies by Dr. Rainey Pines from IR. He recommended Y90 which is currently scheduled for 8/19 and 8/26.  --Labs today show hyperbilirubinemia with a total bilirubin of 3.7. He has a Child-Pugh Score of 10 points, Child Class C.  --Discussed goals of care since patient's overall performance status has declined with worsening liver function. We do not recommend pursuing additional  treatment as risk of toxicities are high. Patient would like to focus on comfort care so we recommend proceeding with hospice care.   #Right atrial mass: --Seen on echocardiograms from 06/16/2021 and 06/21/2021.   --Additional echo findings included moderate mitral regurgitation with preserved LV systolic function and severe aortic atherosclerosis. --Evaluated by cardiothoracic surgery on 06/21/2021 who did no recommend urgent surgery.  --Not likely a metastasis but can only confirm with tissue biopsy. Extremely unusual site for metastasis in [place].  #Diffuse Pruritis: --Secondary to underlying liver disease and  hyperbilirubinemia. --Currently on Atarax and hydrocortisone cream.   #Weight loss: --Recommend to eat small, frequent meals. Supplement with protein shakes  #Mentation: --Patient was alert and oriented during visit however, patient's wife and daughter in law feel that his mental status has changed recently. This includes decreased response and difficulty with orientation to place.  --Likely secondary to hepatic encephalopathy since ammonia levels are elevated at 46.  --Sent lactulose prescription.    No orders of the defined types were placed in this encounter.   All questions were answered. The patient knows to call the clinic with any problems, questions or concerns.  I have spent a total of 60 minutes minutes of face-to-face and non-face-to-face time, preparing to see the patient, obtaining and/or reviewing separately obtained history, performing a medically appropriate examination, counseling and educating the patient, ordering tests, referring and communicating with other health care professionals, documenting clinical information in the electronic health record, and care coordination.   Dede Query, PA-C Department of Hematology/Oncology [place] at [facility] Phone: [phone]  Patient was seen with Dr. Lorenso Courier.   I have read the above note and personally examined the patient. I agree with the assessment and plan as noted above.  Briefly Mr. Plesha is a 75 year old male with medical history significant for hepatocellular carcinoma who presents for a  return visit.  The patient has decided that he does not wish to undergo systemic therapy and would prefer not to have any treatments directed towards his [place].  As such he would like to convert his care to hospice.  I do believe that given his advanced age and the state of his disease that a comfort based approach alone is very reasonable.  We are happy to support him with any additional medications recommendations or prognostications that he requires.  Otherwise we will focus on a comfort based approach from this point moving forward.   Ledell Peoples, MD Department of Hematology/Oncology [place] at [facility] Phone: [phone] Pager: [phone] Email: [email or web address]

## 2021-07-27 LAB — AFP TUMOR MARKER: AFP, Serum, Tumor Marker: 1185 ng/mL — ABNORMAL HIGH (ref 0.0–8.4)

## 2021-07-28 ENCOUNTER — Other Ambulatory Visit: Payer: Self-pay

## 2021-07-28 DIAGNOSIS — C220 Liver cell carcinoma: Secondary | ICD-10-CM

## 2021-07-28 NOTE — Progress Notes (Signed)
Called and spoke with [place] from [facility]. She requested that I place an internal referral so that she could get things initiated. Referral placed.

## 2021-08-01 ENCOUNTER — Emergency Department (HOSPITAL_COMMUNITY)
Admission: EM | Admit: 2021-08-01 | Discharge: 2021-08-01 | Disposition: A | Discharge status: Home / Self Care | Payer: No Typology Code available for payment source | Attending: Student | Admitting: Student

## 2021-08-01 ENCOUNTER — Encounter (HOSPITAL_COMMUNITY): Payer: Self-pay

## 2021-08-01 DIAGNOSIS — C220 Liver cell carcinoma: Secondary | ICD-10-CM | POA: Diagnosis not present

## 2021-08-01 DIAGNOSIS — K7682 Hepatic encephalopathy: Secondary | ICD-10-CM

## 2021-08-01 DIAGNOSIS — Z91040 Latex allergy status: Secondary | ICD-10-CM | POA: Diagnosis not present

## 2021-08-01 DIAGNOSIS — Z87891 Personal history of nicotine dependence: Secondary | ICD-10-CM | POA: Insufficient documentation

## 2021-08-01 DIAGNOSIS — E1122 Type 2 diabetes mellitus with diabetic chronic kidney disease: Secondary | ICD-10-CM | POA: Diagnosis not present

## 2021-08-01 DIAGNOSIS — N182 Chronic kidney disease, stage 2 (mild): Secondary | ICD-10-CM | POA: Insufficient documentation

## 2021-08-01 DIAGNOSIS — K7200 Acute and subacute hepatic failure without coma: Secondary | ICD-10-CM

## 2021-08-01 DIAGNOSIS — Z79899 Other long term (current) drug therapy: Secondary | ICD-10-CM | POA: Insufficient documentation

## 2021-08-01 DIAGNOSIS — J449 Chronic obstructive pulmonary disease, unspecified: Secondary | ICD-10-CM | POA: Insufficient documentation

## 2021-08-01 DIAGNOSIS — K7290 Hepatic failure, unspecified without coma: Secondary | ICD-10-CM | POA: Insufficient documentation

## 2021-08-01 DIAGNOSIS — R17 Unspecified jaundice: Secondary | ICD-10-CM

## 2021-08-01 DIAGNOSIS — E1169 Type 2 diabetes mellitus with other specified complication: Secondary | ICD-10-CM | POA: Insufficient documentation

## 2021-08-01 DIAGNOSIS — R140 Abdominal distension (gaseous): Secondary | ICD-10-CM | POA: Diagnosis present

## 2021-08-01 DIAGNOSIS — E782 Mixed hyperlipidemia: Secondary | ICD-10-CM | POA: Insufficient documentation

## 2021-08-01 DIAGNOSIS — Z7984 Long term (current) use of oral hypoglycemic drugs: Secondary | ICD-10-CM | POA: Insufficient documentation

## 2021-08-01 DIAGNOSIS — I129 Hypertensive chronic kidney disease with stage 1 through stage 4 chronic kidney disease, or unspecified chronic kidney disease: Secondary | ICD-10-CM | POA: Insufficient documentation

## 2021-08-01 DIAGNOSIS — Z7951 Long term (current) use of inhaled steroids: Secondary | ICD-10-CM | POA: Diagnosis not present

## 2021-08-01 LAB — COMPREHENSIVE METABOLIC PANEL
ALT: 48 U/L — ABNORMAL HIGH (ref 0–44)
AST: 86 U/L — ABNORMAL HIGH (ref 15–41)
Albumin: 2.3 g/dL — ABNORMAL LOW (ref 3.5–5.0)
Alkaline Phosphatase: 316 U/L — ABNORMAL HIGH (ref 38–126)
Anion gap: 8 (ref 5–15)
BUN: 17 mg/dL (ref 8–23)
CO2: 25 mmol/L (ref 22–32)
Calcium: 8.9 mg/dL (ref 8.9–10.3)
Chloride: 102 mmol/L (ref 98–111)
Creatinine, Ser: 1.14 mg/dL (ref 0.61–1.24)
GFR, Estimated: >60 mL/min (ref >60)
Glucose, Bld: 135 mg/dL — ABNORMAL HIGH (ref 70–99)
Potassium: 4.7 mmol/L (ref 3.5–5.1)
Sodium: 135 mmol/L (ref 135–145)
Total Bilirubin: 4.2 mg/dL — ABNORMAL HIGH (ref 0.3–1.2)
Total Protein: 6.5 g/dL (ref 6.5–8.1)

## 2021-08-01 LAB — CBC WITH DIFFERENTIAL/PLATELET
Abs Immature Granulocytes: 0.05 10*3/uL (ref 0.00–0.07)
Basophils Absolute: 0.1 10*3/uL (ref 0.0–0.1)
Basophils Relative: 1 %
Eosinophils Absolute: 0.1 10*3/uL (ref 0.0–0.5)
Eosinophils Relative: 1 %
HCT: 42.7 % (ref 39.0–52.0)
Hemoglobin: 13.2 g/dL (ref 13.0–17.0)
Immature Granulocytes: 1 %
Lymphocytes Relative: 16 %
Lymphs Abs: 1.7 10*3/uL (ref 0.7–4.0)
MCH: 29.6 pg (ref 26.0–34.0)
MCHC: 30.9 g/dL (ref 30.0–36.0)
MCV: 95.7 fL (ref 80.0–100.0)
Monocytes Absolute: 1.4 10*3/uL — ABNORMAL HIGH (ref 0.1–1.0)
Monocytes Relative: 12 %
Neutro Abs: 7.8 10*3/uL — ABNORMAL HIGH (ref 1.7–7.7)
Neutrophils Relative %: 69 %
Platelets: 246 10*3/uL (ref 150–400)
RBC: 4.46 MIL/uL (ref 4.22–5.81)
RDW: 22.1 % — ABNORMAL HIGH (ref 11.5–15.5)
WBC: 11.1 10*3/uL — ABNORMAL HIGH (ref 4.0–10.5)
nRBC: 0 % (ref 0.0–0.2)

## 2021-08-01 LAB — AMMONIA: Ammonia: 34 umol/L (ref 9–35)

## 2021-08-01 MED ORDER — LACTULOSE 10 GM/15ML PO SOLN
30.0000 g | Freq: Once | ORAL | Status: AC
  Administered 2021-08-01: 30 g via ORAL
  Filled 2021-08-01: qty 60

## 2021-08-01 NOTE — ED Triage Notes (Signed)
Pt arrived via POV, states pain in right armpit that has been going on for weeks, normally controlled with OTC medication. No relief today.

## 2021-08-01 NOTE — Discharge Instructions (Addendum)
Give the lactulose tonight or tomorrow morning. Pain medications can be given more as needed.  We wish you all the best for the Hospice appointment tomorrow.

## 2021-08-01 NOTE — ED Provider Notes (Signed)
  I did not see or evaluate this patient.  Please see other attending physician note for documentation of patient's care.   Teressa Lower, MD 08/01/21 [phone]

## 2021-08-02 DIAGNOSIS — E785 Hyperlipidemia, unspecified: Secondary | ICD-10-CM | POA: Diagnosis not present

## 2021-08-02 DIAGNOSIS — R569 Unspecified convulsions: Secondary | ICD-10-CM | POA: Diagnosis not present

## 2021-08-02 DIAGNOSIS — Z6823 Body mass index (BMI) 23.0-23.9, adult: Secondary | ICD-10-CM | POA: Diagnosis not present

## 2021-08-02 DIAGNOSIS — K219 Gastro-esophageal reflux disease without esophagitis: Secondary | ICD-10-CM | POA: Diagnosis not present

## 2021-08-02 DIAGNOSIS — N400 Enlarged prostate without lower urinary tract symptoms: Secondary | ICD-10-CM | POA: Diagnosis not present

## 2021-08-02 DIAGNOSIS — C220 Liver cell carcinoma: Secondary | ICD-10-CM | POA: Diagnosis not present

## 2021-08-02 DIAGNOSIS — E119 Type 2 diabetes mellitus without complications: Secondary | ICD-10-CM | POA: Diagnosis not present

## 2021-08-02 DIAGNOSIS — I10 Essential (primary) hypertension: Secondary | ICD-10-CM | POA: Diagnosis not present

## 2021-08-02 DIAGNOSIS — J302 Other seasonal allergic rhinitis: Secondary | ICD-10-CM | POA: Diagnosis not present

## 2021-08-02 DIAGNOSIS — J449 Chronic obstructive pulmonary disease, unspecified: Secondary | ICD-10-CM | POA: Diagnosis not present

## 2021-08-03 ENCOUNTER — Other Ambulatory Visit: Payer: Self-pay

## 2021-08-03 DIAGNOSIS — I10 Essential (primary) hypertension: Secondary | ICD-10-CM | POA: Diagnosis not present

## 2021-08-03 DIAGNOSIS — R569 Unspecified convulsions: Secondary | ICD-10-CM | POA: Diagnosis not present

## 2021-08-03 DIAGNOSIS — J449 Chronic obstructive pulmonary disease, unspecified: Secondary | ICD-10-CM | POA: Diagnosis not present

## 2021-08-03 DIAGNOSIS — C220 Liver cell carcinoma: Secondary | ICD-10-CM | POA: Diagnosis not present

## 2021-08-03 DIAGNOSIS — E785 Hyperlipidemia, unspecified: Secondary | ICD-10-CM | POA: Diagnosis not present

## 2021-08-03 DIAGNOSIS — E119 Type 2 diabetes mellitus without complications: Secondary | ICD-10-CM | POA: Diagnosis not present

## 2021-08-04 ENCOUNTER — Encounter (HOSPITAL_COMMUNITY): Payer: No Typology Code available for payment source

## 2021-08-04 ENCOUNTER — Other Ambulatory Visit (HOSPITAL_COMMUNITY): Payer: Self-pay

## 2021-08-04 ENCOUNTER — Ambulatory Visit (HOSPITAL_COMMUNITY): Payer: No Typology Code available for payment source

## 2021-08-04 ENCOUNTER — Other Ambulatory Visit (HOSPITAL_COMMUNITY): Payer: Medicare Other

## 2021-08-04 ENCOUNTER — Other Ambulatory Visit: Payer: Self-pay

## 2021-08-04 DIAGNOSIS — C220 Liver cell carcinoma: Secondary | ICD-10-CM | POA: Diagnosis not present

## 2021-08-04 DIAGNOSIS — I10 Essential (primary) hypertension: Secondary | ICD-10-CM | POA: Diagnosis not present

## 2021-08-04 DIAGNOSIS — E785 Hyperlipidemia, unspecified: Secondary | ICD-10-CM | POA: Diagnosis not present

## 2021-08-04 DIAGNOSIS — J449 Chronic obstructive pulmonary disease, unspecified: Secondary | ICD-10-CM | POA: Diagnosis not present

## 2021-08-04 DIAGNOSIS — E119 Type 2 diabetes mellitus without complications: Secondary | ICD-10-CM | POA: Diagnosis not present

## 2021-08-04 DIAGNOSIS — R569 Unspecified convulsions: Secondary | ICD-10-CM | POA: Diagnosis not present

## 2021-08-04 MED ORDER — LACTULOSE ENCEPHALOPATHY 10 GM/15ML PO SOLN
20.0000 g | Freq: Two times a day (BID) | ORAL | 1 refills | Status: AC | PRN
  Filled 2021-08-04 (×2): qty 1892, 32d supply, fill #0
  Filled 2021-08-04: qty 946, 16d supply, fill #0
  Filled 2021-08-04: qty 1892, 32d supply, fill #0

## 2021-08-07 ENCOUNTER — Other Ambulatory Visit (HOSPITAL_COMMUNITY): Payer: Self-pay

## 2021-08-07 MED ORDER — OXYCODONE HCL 10 MG PO TABS
10.0000 mg | ORAL_TABLET | ORAL | 0 refills | Status: DC | PRN
  Filled 2021-08-07: qty 120, 20d supply, fill #0

## 2021-08-08 DIAGNOSIS — E119 Type 2 diabetes mellitus without complications: Secondary | ICD-10-CM | POA: Diagnosis not present

## 2021-08-08 DIAGNOSIS — J449 Chronic obstructive pulmonary disease, unspecified: Secondary | ICD-10-CM | POA: Diagnosis not present

## 2021-08-08 DIAGNOSIS — R569 Unspecified convulsions: Secondary | ICD-10-CM | POA: Diagnosis not present

## 2021-08-08 DIAGNOSIS — E785 Hyperlipidemia, unspecified: Secondary | ICD-10-CM | POA: Diagnosis not present

## 2021-08-08 DIAGNOSIS — C220 Liver cell carcinoma: Secondary | ICD-10-CM | POA: Diagnosis not present

## 2021-08-08 DIAGNOSIS — I10 Essential (primary) hypertension: Secondary | ICD-10-CM | POA: Diagnosis not present

## 2021-08-10 ENCOUNTER — Other Ambulatory Visit (HOSPITAL_COMMUNITY): Payer: Medicare Other

## 2021-08-11 ENCOUNTER — Other Ambulatory Visit (HOSPITAL_COMMUNITY): Payer: Medicare Other

## 2021-08-11 ENCOUNTER — Ambulatory Visit (HOSPITAL_COMMUNITY): Payer: Medicare Other

## 2021-08-11 DIAGNOSIS — R569 Unspecified convulsions: Secondary | ICD-10-CM | POA: Diagnosis not present

## 2021-08-11 DIAGNOSIS — J449 Chronic obstructive pulmonary disease, unspecified: Secondary | ICD-10-CM | POA: Diagnosis not present

## 2021-08-11 DIAGNOSIS — I10 Essential (primary) hypertension: Secondary | ICD-10-CM | POA: Diagnosis not present

## 2021-08-11 DIAGNOSIS — C220 Liver cell carcinoma: Secondary | ICD-10-CM | POA: Diagnosis not present

## 2021-08-11 DIAGNOSIS — E785 Hyperlipidemia, unspecified: Secondary | ICD-10-CM | POA: Diagnosis not present

## 2021-08-11 DIAGNOSIS — E119 Type 2 diabetes mellitus without complications: Secondary | ICD-10-CM | POA: Diagnosis not present

## 2021-08-13 DIAGNOSIS — E785 Hyperlipidemia, unspecified: Secondary | ICD-10-CM | POA: Diagnosis not present

## 2021-08-13 DIAGNOSIS — E119 Type 2 diabetes mellitus without complications: Secondary | ICD-10-CM | POA: Diagnosis not present

## 2021-08-13 DIAGNOSIS — R569 Unspecified convulsions: Secondary | ICD-10-CM | POA: Diagnosis not present

## 2021-08-13 DIAGNOSIS — C220 Liver cell carcinoma: Secondary | ICD-10-CM | POA: Diagnosis not present

## 2021-08-13 DIAGNOSIS — J449 Chronic obstructive pulmonary disease, unspecified: Secondary | ICD-10-CM | POA: Diagnosis not present

## 2021-08-13 DIAGNOSIS — I10 Essential (primary) hypertension: Secondary | ICD-10-CM | POA: Diagnosis not present

## 2021-08-14 ENCOUNTER — Ambulatory Visit (HOSPITAL_BASED_OUTPATIENT_CLINIC_OR_DEPARTMENT_OTHER): Payer: No Typology Code available for payment source | Admitting: Cardiology

## 2021-08-14 DIAGNOSIS — E119 Type 2 diabetes mellitus without complications: Secondary | ICD-10-CM | POA: Diagnosis not present

## 2021-08-14 DIAGNOSIS — C220 Liver cell carcinoma: Secondary | ICD-10-CM | POA: Diagnosis not present

## 2021-08-14 DIAGNOSIS — I10 Essential (primary) hypertension: Secondary | ICD-10-CM | POA: Diagnosis not present

## 2021-08-14 DIAGNOSIS — J449 Chronic obstructive pulmonary disease, unspecified: Secondary | ICD-10-CM | POA: Diagnosis not present

## 2021-08-14 DIAGNOSIS — R569 Unspecified convulsions: Secondary | ICD-10-CM | POA: Diagnosis not present

## 2021-08-14 DIAGNOSIS — E785 Hyperlipidemia, unspecified: Secondary | ICD-10-CM | POA: Diagnosis not present

## 2021-08-16 DIAGNOSIS — R569 Unspecified convulsions: Secondary | ICD-10-CM | POA: Diagnosis not present

## 2021-08-16 DIAGNOSIS — E119 Type 2 diabetes mellitus without complications: Secondary | ICD-10-CM | POA: Diagnosis not present

## 2021-08-16 DIAGNOSIS — I10 Essential (primary) hypertension: Secondary | ICD-10-CM | POA: Diagnosis not present

## 2021-08-16 DIAGNOSIS — J449 Chronic obstructive pulmonary disease, unspecified: Secondary | ICD-10-CM | POA: Diagnosis not present

## 2021-08-16 DIAGNOSIS — E785 Hyperlipidemia, unspecified: Secondary | ICD-10-CM | POA: Diagnosis not present

## 2021-08-16 DIAGNOSIS — C220 Liver cell carcinoma: Secondary | ICD-10-CM | POA: Diagnosis not present

## 2021-08-17 DIAGNOSIS — C220 Liver cell carcinoma: Secondary | ICD-10-CM | POA: Diagnosis not present

## 2021-08-17 DIAGNOSIS — Z6823 Body mass index (BMI) 23.0-23.9, adult: Secondary | ICD-10-CM | POA: Diagnosis not present

## 2021-08-17 DIAGNOSIS — R569 Unspecified convulsions: Secondary | ICD-10-CM | POA: Diagnosis not present

## 2021-08-17 DIAGNOSIS — K219 Gastro-esophageal reflux disease without esophagitis: Secondary | ICD-10-CM | POA: Diagnosis not present

## 2021-08-17 DIAGNOSIS — J302 Other seasonal allergic rhinitis: Secondary | ICD-10-CM | POA: Diagnosis not present

## 2021-08-17 DIAGNOSIS — J449 Chronic obstructive pulmonary disease, unspecified: Secondary | ICD-10-CM | POA: Diagnosis not present

## 2021-08-17 DIAGNOSIS — E119 Type 2 diabetes mellitus without complications: Secondary | ICD-10-CM | POA: Diagnosis not present

## 2021-08-17 DIAGNOSIS — E785 Hyperlipidemia, unspecified: Secondary | ICD-10-CM | POA: Diagnosis not present

## 2021-08-17 DIAGNOSIS — I10 Essential (primary) hypertension: Secondary | ICD-10-CM | POA: Diagnosis not present

## 2021-08-17 DIAGNOSIS — N400 Enlarged prostate without lower urinary tract symptoms: Secondary | ICD-10-CM | POA: Diagnosis not present

## 2021-08-18 DIAGNOSIS — R569 Unspecified convulsions: Secondary | ICD-10-CM | POA: Diagnosis not present

## 2021-08-18 DIAGNOSIS — I10 Essential (primary) hypertension: Secondary | ICD-10-CM | POA: Diagnosis not present

## 2021-08-18 DIAGNOSIS — J449 Chronic obstructive pulmonary disease, unspecified: Secondary | ICD-10-CM | POA: Diagnosis not present

## 2021-08-18 DIAGNOSIS — C220 Liver cell carcinoma: Secondary | ICD-10-CM | POA: Diagnosis not present

## 2021-08-18 DIAGNOSIS — E119 Type 2 diabetes mellitus without complications: Secondary | ICD-10-CM | POA: Diagnosis not present

## 2021-08-18 DIAGNOSIS — E785 Hyperlipidemia, unspecified: Secondary | ICD-10-CM | POA: Diagnosis not present

## 2021-09-16 NOTE — ED Provider Notes (Signed)
[facility] DEPT Provider Note   CSN: [id number] Arrival date & time: 08/01/21  1013     History Chief Complaint  Patient presents with   Pain    Trevor [name] is a 75 y.o. male.  HPI    75 y.o. male with history of COPD, diabetes, advanced hepatocellular carcinoma comes in with chief complaint of pain.  Patients are having some pain in his axillary region yesterday night.  Over-the-counter medications did not help alleviate the pain.  Patient did not sleep well.  No history of similar symptoms in the past.  Pain has now resolved.  No chest pain, shortness of breath.  Patient denies any numbness, tingling.  Additionally there is some constipation and abdominal distention.  Family also reports increased hallucination. Past Medical History:  Diagnosis Date   BPH (benign prostatic hyperplasia)    COPD (chronic obstructive pulmonary disease) (HCC)    COPD (chronic obstructive pulmonary disease) ([place])    COVID-19    Diabetes mellitus without complication (HCC)    GERD (gastroesophageal reflux disease)    Hypercholesterolemia    Hypertension    Seizures ([place])    Type II or unspecified type diabetes mellitus without mention of complication, not stated as uncontrolled     Patient Active Problem List   Diagnosis Date Noted   Encephalopathy due to ammonia 07/26/2021   Pruritus 06/29/2021   Weight loss 06/29/2021   Right atrial mass 06/16/2021   Hepatocellular carcinoma ([place]) 06/16/2021   Malnutrition of moderate degree 06/16/2021   Pneumonia 06/15/2021   Acute kidney injury ([place]) 06/15/2021   Normocytic anemia 06/15/2021   Vitamin D deficiency 10/08/2017   COPD (chronic obstructive pulmonary disease) ([place]) 07/08/2017   Morbid obesity ([place]) 12/22/2015   Encounter for Medicare annual wellness exam 12/22/2015   Hyperlipidemia, mixed    Hypertension    Diabetes mellitus due to underlying condition with stage 2 chronic kidney disease (HCC)     Seizures (HCC)    BPH (benign prostatic hyperplasia)    GERD (gastroesophageal reflux disease)     Past Surgical History:  Procedure Laterality Date   BUBBLE STUDY  06/21/2021   Procedure: BUBBLE STUDY;  Surgeon: Dixie Dials, MD;  Location: [facility];  Service: Cardiovascular;;   HAND SURGERY Left 1953   IR RADIOLOGIST EVAL & MGMT  07/11/2021   IR US GUIDE BX ASP/DRAIN  06/20/2021   KNEE ARTHROSCOPY Left 1998   TEE WITHOUT CARDIOVERSION N/A 06/21/2021   Procedure: TRANSESOPHAGEAL ECHOCARDIOGRAM (TEE);  Surgeon: Dixie Dials, MD;  Location: [facility];  Service: Cardiovascular;  Laterality: N/A;       Family History  Problem Relation Age of Onset   CVA Mother    Thyroid disease Mother    Heart disease Father    Diabetes Sister    Heart disease Sister    Diabetes Brother    Heart disease Brother    Epilepsy Brother    Schizophrenia Brother    Cancer Maternal Grandfather    Cancer Maternal Aunt     Social History   Tobacco Use   Smoking status: Former    Packs/day: 1.00    Years: 50.00    Pack years: 50.00    Types: Cigarettes    Quit date: 12/17/2013    Years since quitting: 7.6   Smokeless tobacco: Never  Vaping Use   Vaping Use: Never used  Substance Use Topics   Alcohol use: Not Currently   Drug use: No  Home Medications Prior to Admission medications   Medication Sig Start Date End Date Taking? Authorizing Provider  albuterol (VENTOLIN HFA) 108 (90 Base) MCG/ACT inhaler Inhale 1-2 puffs into the lungs every 6 (six) hours as needed for wheezing or shortness of breath. 11/17/20  Yes Scot Jun, FNP  divalproex (DEPAKOTE ER) 500 MG 24 hr tablet Take 1 tablet (500 mg total) by mouth 2 (two) times daily. 10/11/17  Yes Unk Pinto, MD  finasteride (PROSCAR) 5 MG tablet Take 5 mg by mouth daily after supper.   Yes [provider]  fluticasone (FLONASE) 50 MCG/ACT nasal spray Place 2 sprays into both nostrils daily. 12/05/15  Yes Rolene Course, PA-C  hydrocortisone 1 % lotion Apply 1 application topically 2 (two) times daily. 06/28/21  Yes Dede Query T, PA-C  hydrOXYzine (ATARAX/VISTARIL) 10 MG tablet Take 1 tablet (10 mg total) by mouth 3 (three) times daily as needed for itching. 07/24/21  Yes Lincoln Brigham, PA-C  ipratropium-albuterol (DUONEB) 0.5-2.5 (3) MG/3ML SOLN Take 3 mLs by nebulization every 6 (six) hours as needed (wheezing, shortness of breath). 03/16/19  Yes Wieters, Hallie C, PA-C  metFORMIN (GLUCOPHAGE) 1000 MG tablet Take 1 tablet (1,000 mg total) by mouth 2 (two) times daily with a meal. 06/23/21 08/01/21 Yes Axel Filler, MD  ondansetron (ZOFRAN) 8 MG tablet Take 1 tablet (8 mg total) by mouth every 8 (eight) hours as needed for nausea or vomiting. 07/26/21  Yes Orson Slick, MD  OVER THE COUNTER MEDICATION Apply 1 application topically as needed (itching). [place] anti-itch cream   Yes [provider]  oxyCODONE (OXY IR/ROXICODONE) 5 MG immediate release tablet Take 1 tablet (5 mg total) by mouth every 4 (four) hours as needed for severe pain or moderate pain. 07/24/21  Yes Lincoln Brigham, PA-C  lactulose (CEPHULAC) 10 g packet Take 1 packet (10 g total) by mouth daily as needed for up to 1 dose. 07/26/21   Dede Query T, PA-C  lactulose, encephalopathy, (CHRONULAC) 10 GM/15ML SOLN Take 30 mLs (20 g total) by mouth 2 (two) times daily as needed. 08/04/21     SORAfenib (NEXAVAR) 200 MG tablet Take 1 tablet (200 mg total) by mouth 2 (two) times daily. Give on an empty stomach 1 hour before or 2 hours after meals. Patient not taking: No sig reported 07/21/21   Dede Query T, PA-C    Allergies    Atorvastatin, Latex, Penicillin g, and Penicillins  Review of Systems   Review of Systems  Constitutional:  Positive for activity change.  Respiratory:  Negative for shortness of breath.   Cardiovascular:  Negative for chest pain.  Gastrointestinal:  Positive for abdominal distention.   Musculoskeletal:  Positive for myalgias.  All other systems reviewed and are negative.  Physical Exam Updated Vital Signs BP (!) 115/58   Pulse 83   Temp 98.1 F (36.7 C) (Oral)   Resp 16   SpO2 96%   Physical Exam Vitals and nursing note reviewed.  Constitutional:      Appearance: He is well-developed.  HENT:     Head: Atraumatic.  Cardiovascular:     Rate and Rhythm: Normal rate.  Pulmonary:     Effort: Pulmonary effort is normal.  Abdominal:     General: There is distension.  Musculoskeletal:        General: No swelling, tenderness, deformity or signs of injury.     Cervical back: Neck supple.  Skin:    General:  Skin is warm.     Findings: No bruising.  Neurological:     Mental Status: He is alert and oriented to person, place, and time.    ED Results / Procedures / Treatments   Labs (all labs ordered are listed, but only abnormal results are displayed) Labs Reviewed  COMPREHENSIVE METABOLIC PANEL - Abnormal; Notable for the following components:      Result Value   Glucose, Bld 135 (*)    Albumin 2.3 (*)    AST 86 (*)    ALT 48 (*)    Alkaline Phosphatase 316 (*)    Total Bilirubin 4.2 (*)    All other components within normal limits  CBC WITH DIFFERENTIAL/PLATELET - Abnormal; Notable for the following components:   WBC 11.1 (*)    RDW 22.1 (*)    Neutro Abs 7.8 (*)    Monocytes Absolute 1.4 (*)    All other components within normal limits  AMMONIA    EKG None  Radiology No results found.  Procedures Procedures   Medications Ordered in ED Medications  lactulose (CHRONULAC) 10 GM/15ML solution 30 g (30 g Oral Given 08/01/21 1237)  lactulose (CHRONULAC) 10 GM/15ML solution 30 g (30 g Oral Given 08/01/21 1443)    ED Course  I have reviewed the triage vital signs and the nursing notes.  Pertinent labs & imaging results that were available during my care of the patient were reviewed by me and considered in my medical decision making (see chart  for details).    MDM Rules/Calculators/A&P                            Patient comes in with chief complaint of pain. The pain is since resolved.  Sounds like phantom type pain almost, as it has some neurogenic component to it and there was no specific evoking, aggravating or relieving factors.  Additionally no signs of impingement syndrome on our exam.  We did do a bedside ultrasound, there is no significant ascites for [place] to drain at this time.  Previously has had 5 L drained, at this time it seems that he might have 1 to 2 L.  No need for emergent paracentesis at this time.  Labs ordered to ensure that there is no major changes.  Patient and the family does want to go home if possible, which is our goal as well.  Lactulose prescription was discussed to make sure he gets enough BM to prevent hepatic encephalopathy.  Final Clinical Impression(s) / ED Diagnoses Final diagnoses:  Cancer, hepatocellular ([place])  Elevated bilirubin  Acute hepatic encephalopathy    Rx / DC Orders ED Discharge Orders     None        Varney Biles, MD 11-Sep-2021 [phone]

## 2021-09-16 DEATH — deceased

## 2023-03-19 ENCOUNTER — Other Ambulatory Visit (HOSPITAL_COMMUNITY): Payer: Self-pay

## 2023-07-01 IMAGING — MR MR ABDOMEN WO/W CM
11 of 19 series · 22 of 48 positions shown · IV contrast (8 GAD)
Comparison: 06/15/2021

CLINICAL DATA: Evaluate liver mass.

EXAM:
MRI ABDOMEN WITHOUT AND WITH CONTRAST
TECHNIQUE: Multiplanar multisequence MR imaging of the abdomen was performed
both before and after the administration of intravenous contrast.
CONTRAST:  8mL GADAVIST GADOBUTROL 1 MMOL/ML IV SOLN

[Series 4: cor ssfse nav · coronal · 6.0mm · 0.78mm/px · 1 of 44 slices shown]
[im 1/44]
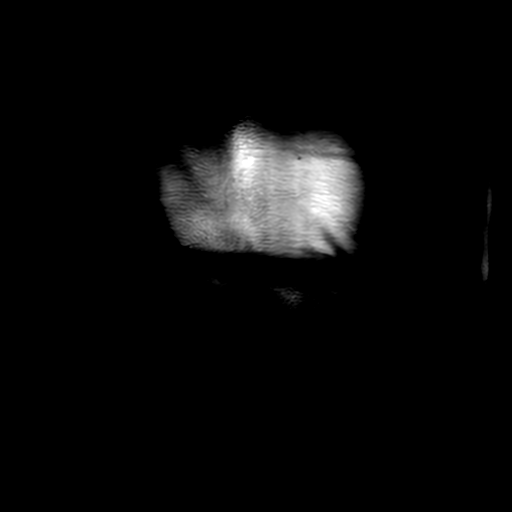

[Series 5: ax ssfse nav · axial · 6.0mm · 0.74mm/px · 1 of 42 slices shown]
[im 1/42]
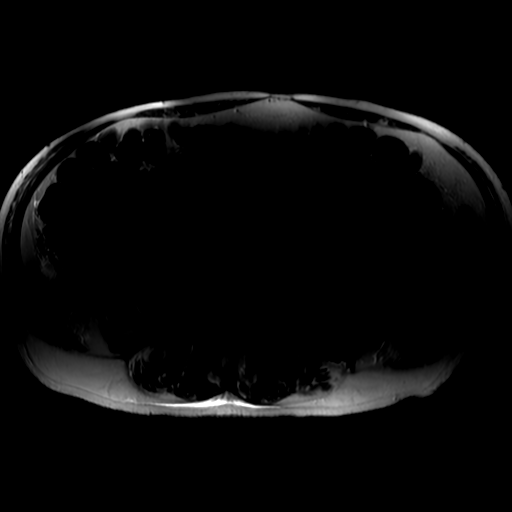

[Series 6: T2 fat-sat · axial · 6.0mm · 0.74mm/px · 1 of 38 slices shown]
[im 1/38]
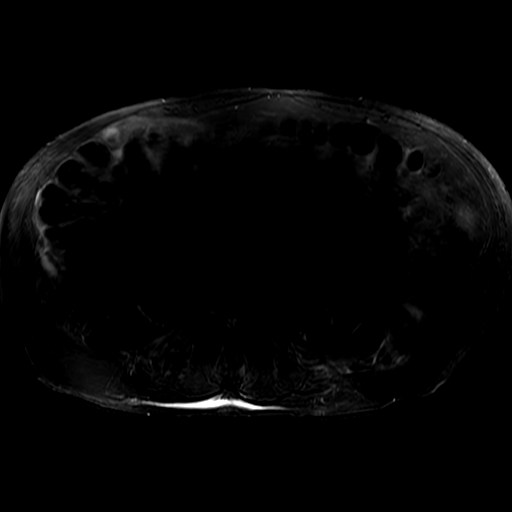

[Series 7: DWI b500 · axial · 8.0mm · 1.76mm/px · 1 of 54 slices shown]
[im 1/54]
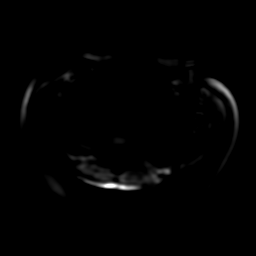

[Series 11: T1 dynamic · coronal · 3.3mm · 1.56mm/px · 4 of 156 slices shown]
[im 1/156]
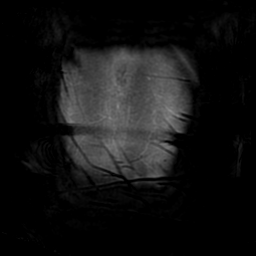
[im 52/156]
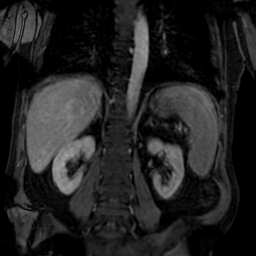
[im 104/156]
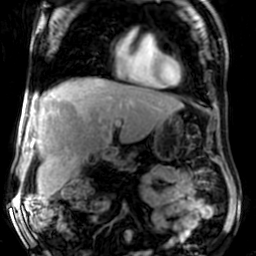
[im 156/156]
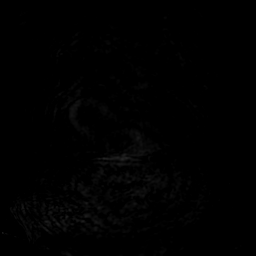

[Series 750: ADC · axial · 8.0mm · 1.76mm/px · 1 of 27 slices shown]
[im 1/27]
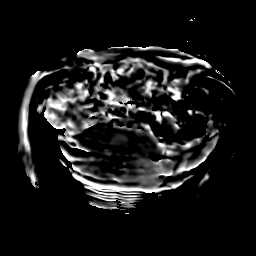

[Series 1000: T1 dynamic post-contrast · axial · non-contrast · 3.9mm · 0.86mm/px · z∈[-155,+95]mm · 3 of 126 slices shown (1 of 5)]
[im 1/126]
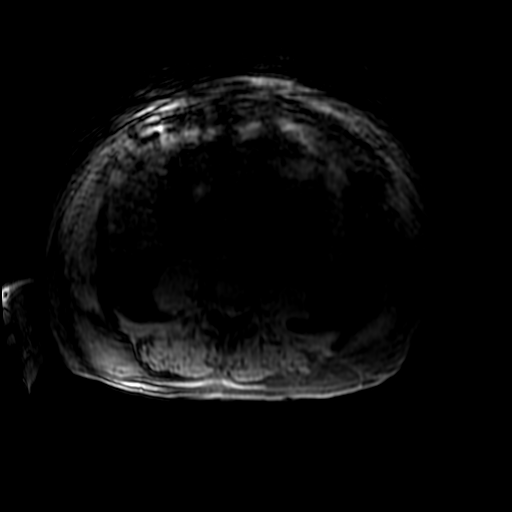
[im 63/126]
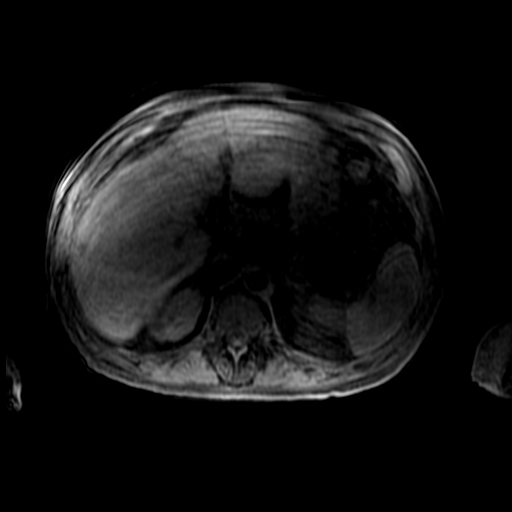
[im 126/126]
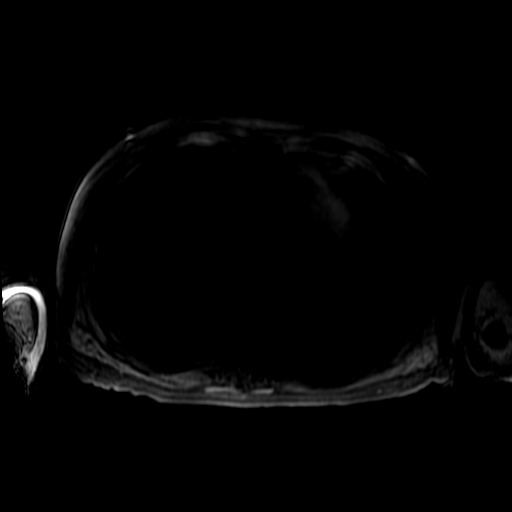

[Series 1001: T1 dynamic post-contrast · axial · non-contrast · 3.9mm · 0.86mm/px · z∈[-155,+95]mm · 3 of 126 slices shown (2 of 5)]
[im 1/126]
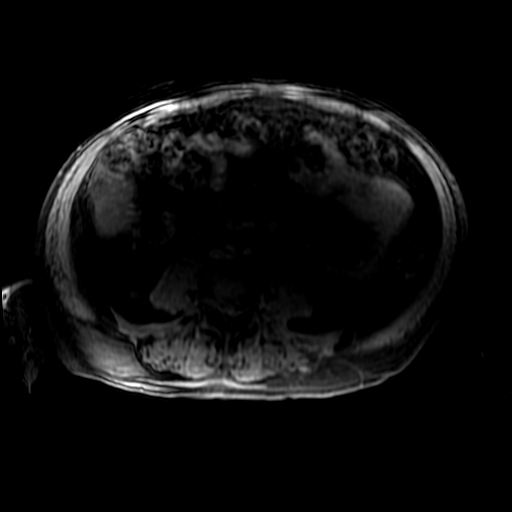
[im 63/126]
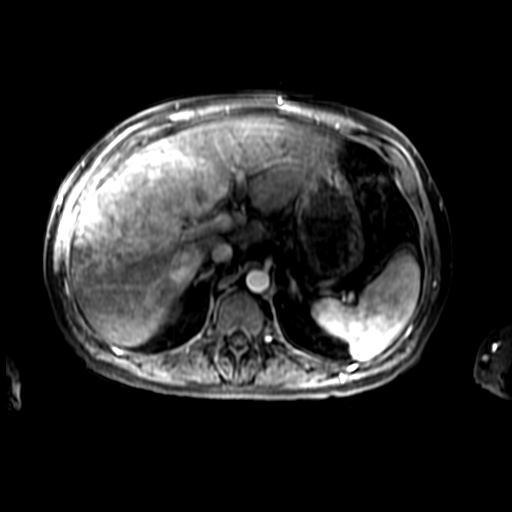
[im 126/126]
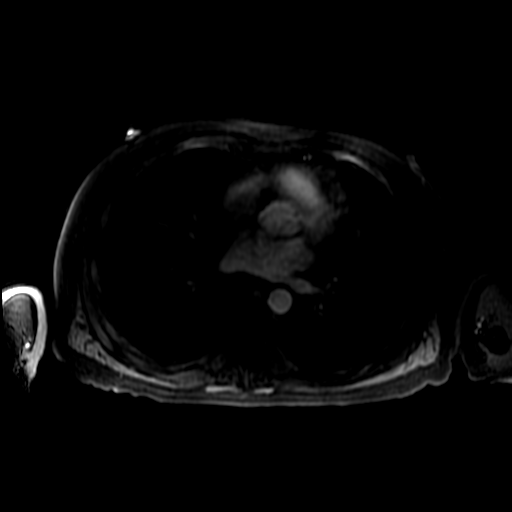

[Series 1002: T1 dynamic post-contrast · axial · non-contrast · 3.9mm · 0.86mm/px · z∈[-155,+95]mm · 3 of 126 slices shown (3 of 5)]
[im 1/126]
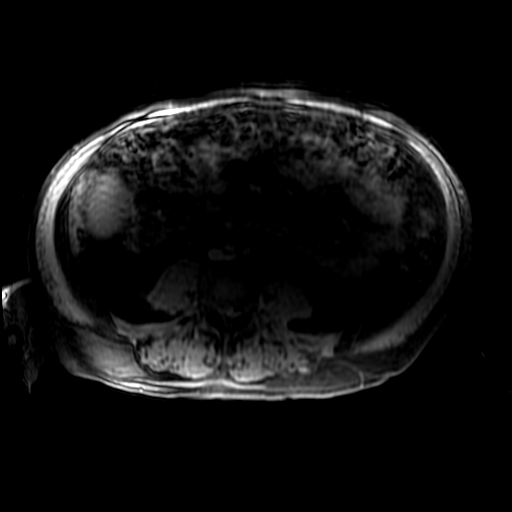
[im 63/126]
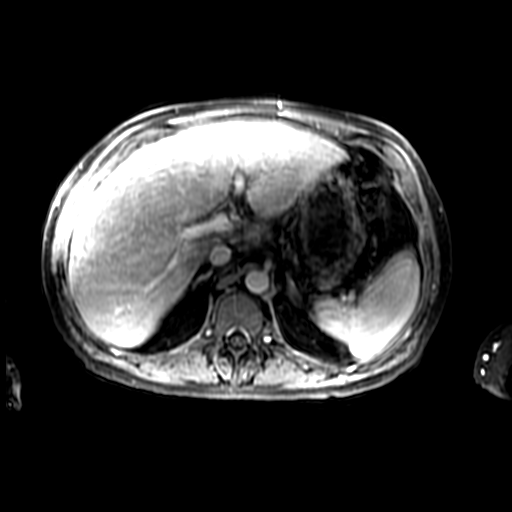
[im 126/126]
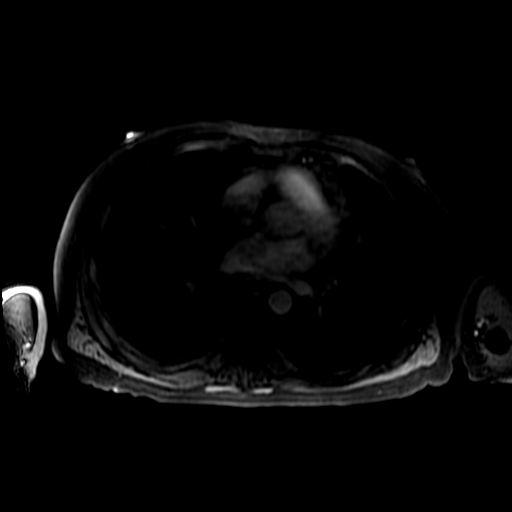

[Series 1003: T1 dynamic post-contrast · axial · non-contrast · 3.9mm · 0.86mm/px · z∈[-155,+95]mm · 3 of 126 slices shown (4 of 5)]
[im 1/126]
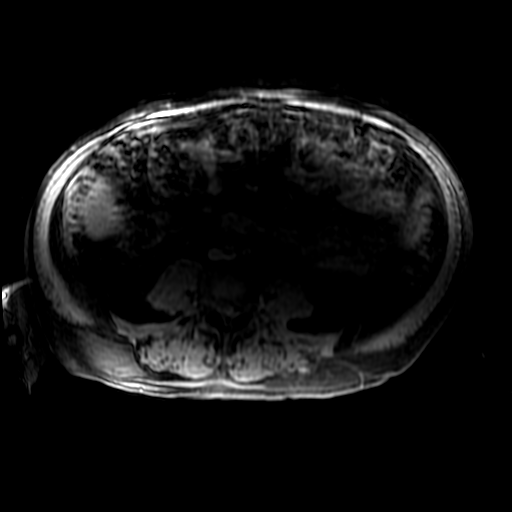
[im 63/126]
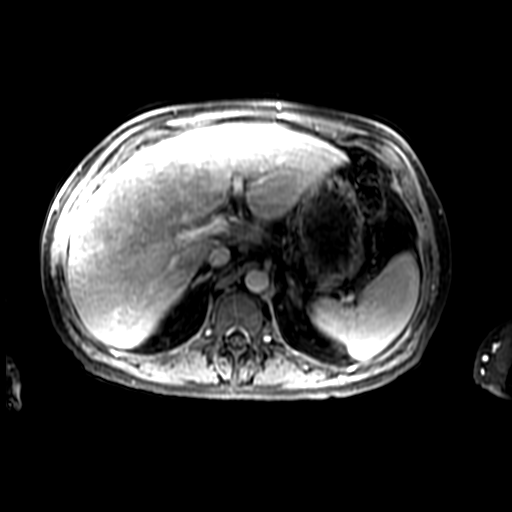
[im 126/126]
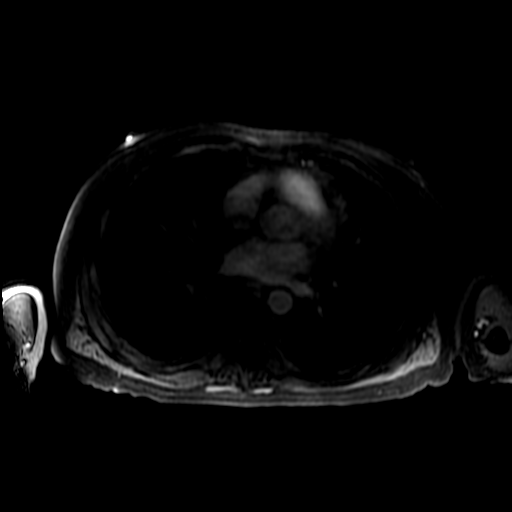

[Series 1004: T1 dynamic post-contrast · axial · non-contrast · 3.9mm · 0.86mm/px · 1 of 126 slices shown (5 of 5)]
[im 1/126]
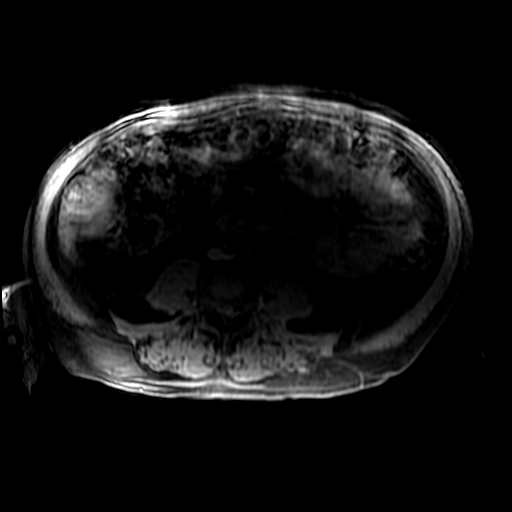

[22 of 48 positions shown; findings below may reference images not displayed]

FINDINGS: Lower chest: Trace bilateral pleural effusions. Filling defect
within the right atrium is identified measuring approximately
cm. The intrahepatic IVC appears patent.

Hepatobiliary: On the arterial phase images there is a large
hyperenhancing mass involving segment 7, 8, 4 a, and 4B. This
measures 14.4 by 9.4 by 9.4 cm. The mass has heterogeneous arterial
phase enhancement. On the delayed images there is washout of
contrast from within this mass. Several additional, smaller
satellite lesions are identified which also exhibit arterial phase
enhancement including a 1.3 cm lesion in the periphery of the right
hepatic lobe, image 59/1991.

The gallbladder is collapsed and contains a 6 mm stone. Mild edema
the gallbladder wall. No signs of bile duct dilatation.

Pancreas: No mass, inflammatory changes, or other parenchymal
abnormality identified.

Spleen: The spleen measures 14.4 by 9.8 by 5.8 cm (volume = 430
cm^3). No focal splenic abnormality.

Adrenals/Urinary Tract: Normal appearance of the adrenal glands. No
kidney mass or hydronephrosis.

Stomach/Bowel: Visualized portions within the abdomen are
unremarkable.

Vascular/Lymphatic:

Normal caliber of the abdominal aorta.  Portal vein appears patent.

[place] hepatics lymph node is borderline enlarged measuring 1 cm,
image 21/. Gastrohepatic ligament nodes measure 1.2 cm, image [DATE].
Portacaval lymph node measures 1 cm, image [DATE]

Other:  Small volume of ascites identified.

Musculoskeletal: No suspicious bone lesions identified.
IMPRESSION: 1. Large arterial phase enhancing mass involving segment 7, 8, 4A
and 4B, which shows washout on delayed images. In a patient with
cirrhosis or viral hepatitis findings are highly concerning for
infiltrative hepatocellular carcinoma. In the absence risk factors
for hepatocellular carcinoma other potential diagnostic
considerations include intrahepatic cholangiocarcinoma or less
likely metastatic disease. Clinical correlation including assessment
alpha fetoprotein levels advised. Several additional smaller
satellite lesions identified which also exhibit arterial phase
enhancement inter suspicious for malignancy.
2. Right atrial mass, indeterminate.
3. Mild splenomegaly.
4. Trace bilateral pleural effusions and small volume of ascites.
5. Borderline enlarged upper abdominal lymph nodes are identified.
Cannot exclude metastatic.

## 2023-07-04 IMAGING — US IR US GUIDANCE
1 series · 8 of 8 positions shown · non-contrast
Comparison: none

INDICATION: Right liver mass

[Series 1: ir us guidance · 8 of 8 slices shown]
[im 1/8]
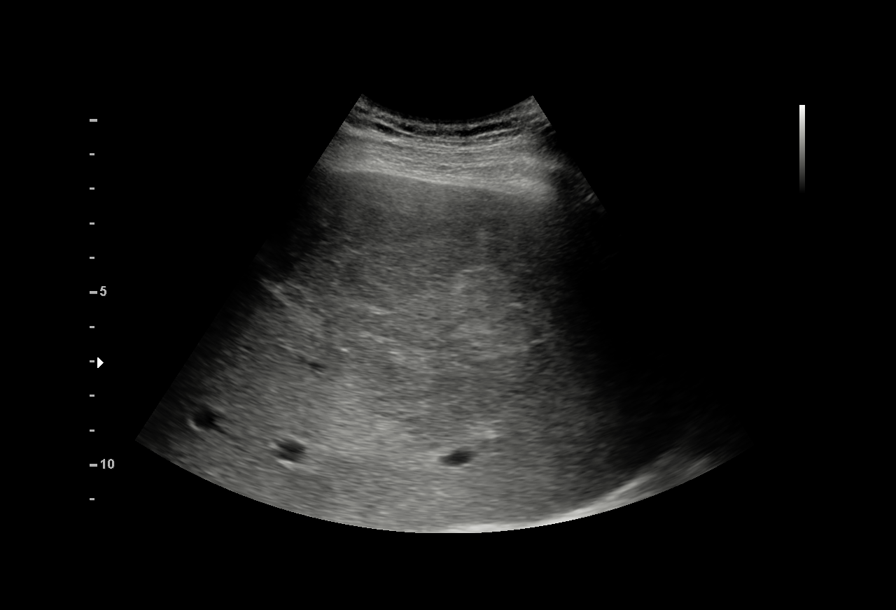
[im 2/8]
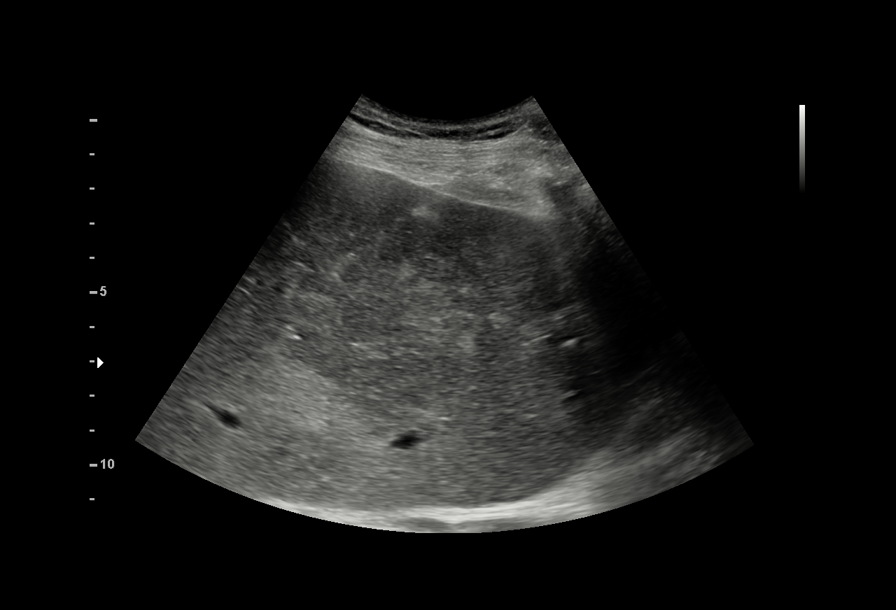
[im 3/8]
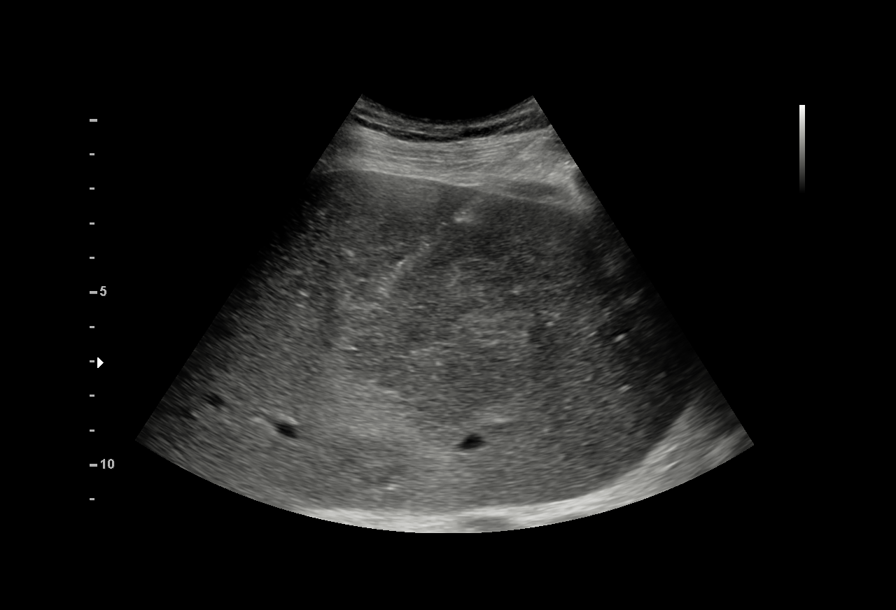
[im 4/8]
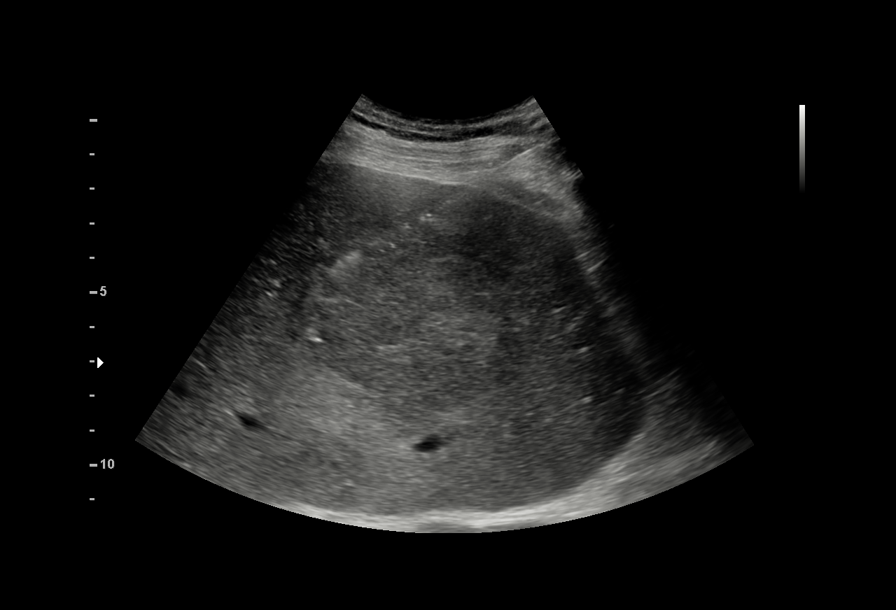
[im 5/8]
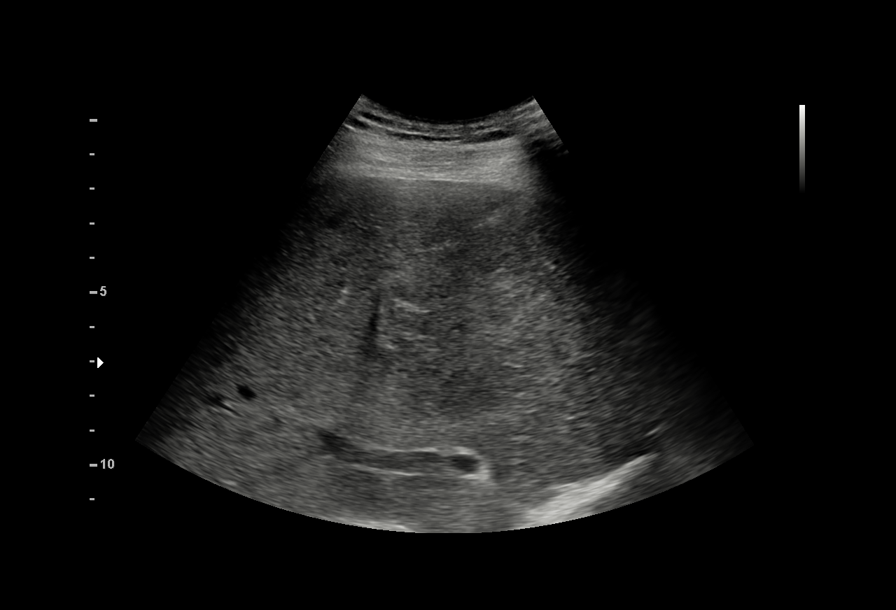
[im 6/8]
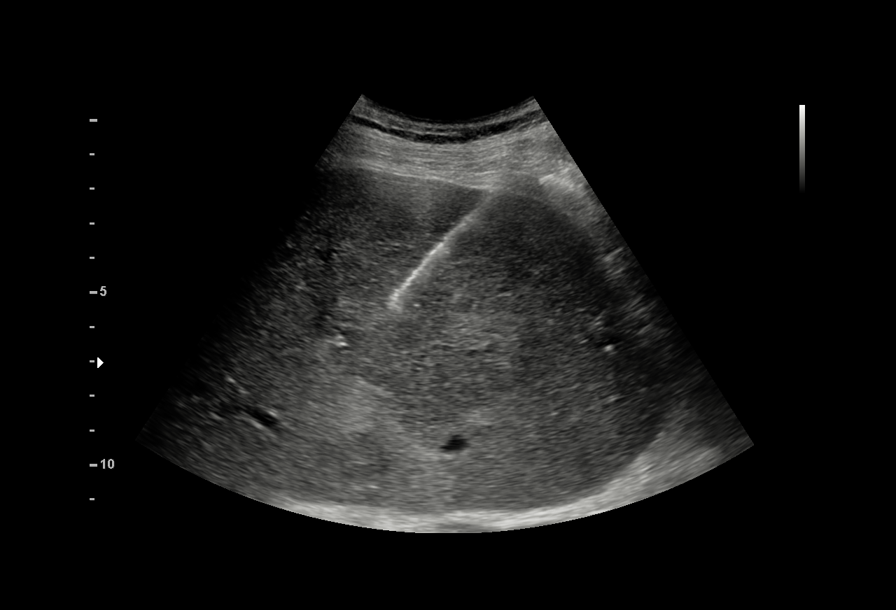
[im 7/8]
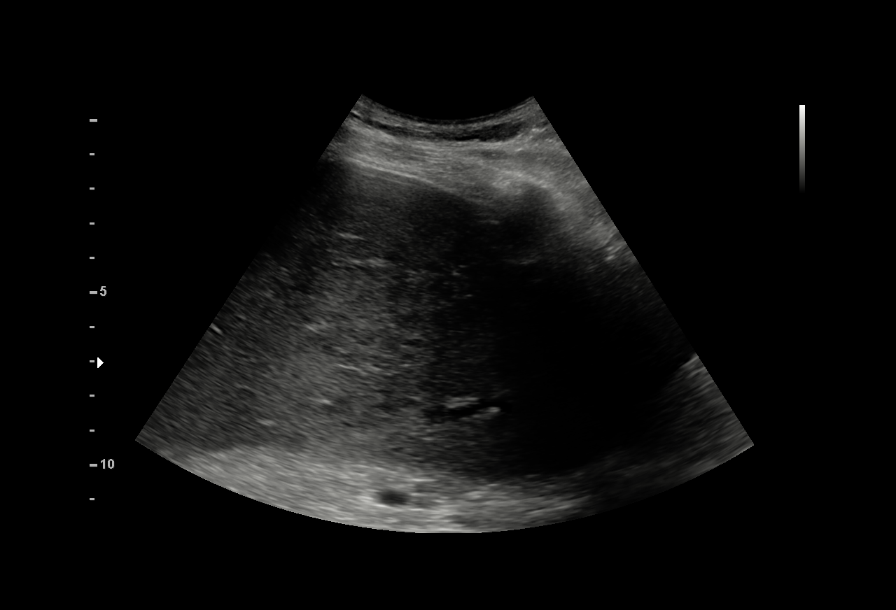
[im 8/8]
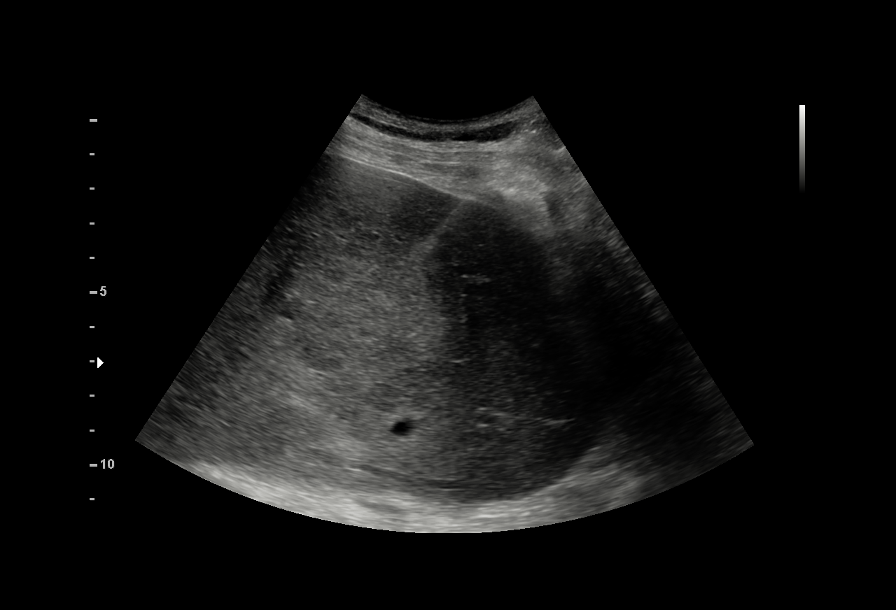

[8 of 8 positions shown; findings below may reference images not displayed]

EXAM:
Ultrasound-guided biopsy of right liver mass

MEDICATIONS:
None.

ANESTHESIA/SEDATION:
Moderate (conscious) sedation was employed during this procedure. A
total of Versed 1.5 mg and Fentanyl 50 mcg was administered
intravenously.

Moderate Sedation Time: 15 minutes. The patient's level of
consciousness and vital signs were monitored continuously by
radiology nursing throughout the procedure under my direct
supervision.

COMPLICATIONS:
None immediate.

PROCEDURE:
Informed written consent was obtained from the patient after a
thorough discussion of the procedural risks, benefits and
alternatives. All questions were addressed. Maximal Sterile Barrier
Technique was utilized including caps, mask, sterile gowns, sterile
gloves, sterile drape, hand hygiene and skin antiseptic. A timeout
was performed prior to the initiation of the procedure.

Patient position supine on the ultrasound table.

Right upper quadrant skin prepped and draped in usual sterile
fashion.

Following local lidocaine administration, 17 gauge introducer needle
was advanced into the right liver lesion, and 4- 18 gauge cores were
obtained utilizing continuous ultrasound guidance.

Gelfoam slurry was administered through the introducer needle at the
biopsy site.

Samples were sent to pathology in formalin.

Needle removed and hemostasis achieved with 5 minutes of manual
compression.
IMPRESSION: Ultrasound-guided biopsy of right liver mass as above.
